# Patient Record
Sex: Female | Born: 1935 | Race: White | Hispanic: No | Smoking: Never smoker
Health system: Southern US, Community
[De-identification: ages and names within clinical notes are randomized; demographics above are authoritative.]

## PROBLEM LIST (undated history)

## (undated) DIAGNOSIS — J439 Emphysema, unspecified: Secondary | ICD-10-CM

## (undated) DIAGNOSIS — I10 Essential (primary) hypertension: Secondary | ICD-10-CM

## (undated) DIAGNOSIS — I5030 Unspecified diastolic (congestive) heart failure: Secondary | ICD-10-CM

## (undated) DIAGNOSIS — T8182XA Emphysema (subcutaneous) resulting from a procedure, initial encounter: Secondary | ICD-10-CM

---

## 2020-03-20 ENCOUNTER — Emergency Department: Payer: Medicare Other

## 2020-03-20 ENCOUNTER — Other Ambulatory Visit: Payer: Self-pay

## 2020-03-20 ENCOUNTER — Encounter: Payer: Self-pay | Admitting: Emergency Medicine

## 2020-03-20 ENCOUNTER — Inpatient Hospital Stay
Admission: EM | Admit: 2020-03-20 | Discharge: 2020-03-23 | DRG: 280 | Disposition: A | Discharge status: Home / Self Care | Payer: Medicare Other | Attending: Internal Medicine | Admitting: Internal Medicine

## 2020-03-20 DIAGNOSIS — Z7901 Long term (current) use of anticoagulants: Secondary | ICD-10-CM

## 2020-03-20 DIAGNOSIS — I4891 Unspecified atrial fibrillation: Secondary | ICD-10-CM

## 2020-03-20 DIAGNOSIS — I214 Non-ST elevation (NSTEMI) myocardial infarction: Principal | ICD-10-CM

## 2020-03-20 DIAGNOSIS — R079 Chest pain, unspecified: Secondary | ICD-10-CM

## 2020-03-20 DIAGNOSIS — I10 Essential (primary) hypertension: Secondary | ICD-10-CM

## 2020-03-20 DIAGNOSIS — I5033 Acute on chronic diastolic (congestive) heart failure: Secondary | ICD-10-CM

## 2020-03-20 DIAGNOSIS — I5043 Acute on chronic combined systolic (congestive) and diastolic (congestive) heart failure: Secondary | ICD-10-CM | POA: Diagnosis present

## 2020-03-20 DIAGNOSIS — I480 Paroxysmal atrial fibrillation: Secondary | ICD-10-CM | POA: Diagnosis present

## 2020-03-20 DIAGNOSIS — Z20822 Contact with and (suspected) exposure to covid-19: Secondary | ICD-10-CM | POA: Diagnosis present

## 2020-03-20 DIAGNOSIS — E785 Hyperlipidemia, unspecified: Secondary | ICD-10-CM | POA: Diagnosis present

## 2020-03-20 DIAGNOSIS — Z91018 Allergy to other foods: Secondary | ICD-10-CM

## 2020-03-20 DIAGNOSIS — I4892 Unspecified atrial flutter: Secondary | ICD-10-CM | POA: Diagnosis present

## 2020-03-20 DIAGNOSIS — Z881 Allergy status to other antibiotic agents status: Secondary | ICD-10-CM

## 2020-03-20 DIAGNOSIS — Z800 Family history of malignant neoplasm of digestive organs: Secondary | ICD-10-CM

## 2020-03-20 DIAGNOSIS — I110 Hypertensive heart disease with heart failure: Secondary | ICD-10-CM | POA: Diagnosis present

## 2020-03-20 DIAGNOSIS — I5181 Takotsubo syndrome: Secondary | ICD-10-CM

## 2020-03-20 DIAGNOSIS — J439 Emphysema, unspecified: Secondary | ICD-10-CM | POA: Diagnosis present

## 2020-03-20 DIAGNOSIS — Z8249 Family history of ischemic heart disease and other diseases of the circulatory system: Secondary | ICD-10-CM

## 2020-03-20 DIAGNOSIS — I5021 Acute systolic (congestive) heart failure: Secondary | ICD-10-CM | POA: Diagnosis present

## 2020-03-20 DIAGNOSIS — Z880 Allergy status to penicillin: Secondary | ICD-10-CM

## 2020-03-20 DIAGNOSIS — D6859 Other primary thrombophilia: Secondary | ICD-10-CM | POA: Diagnosis present

## 2020-03-20 DIAGNOSIS — I2510 Atherosclerotic heart disease of native coronary artery without angina pectoris: Secondary | ICD-10-CM | POA: Diagnosis present

## 2020-03-20 DIAGNOSIS — R778 Other specified abnormalities of plasma proteins: Secondary | ICD-10-CM

## 2020-03-20 DIAGNOSIS — R531 Weakness: Secondary | ICD-10-CM

## 2020-03-20 HISTORY — DX: Emphysema (subcutaneous) resulting from a procedure, initial encounter: T81.82XA

## 2020-03-20 HISTORY — DX: Unspecified diastolic (congestive) heart failure: I50.30

## 2020-03-20 HISTORY — DX: Essential (primary) hypertension: I10

## 2020-03-20 HISTORY — DX: Emphysema, unspecified: J43.9

## 2020-03-20 LAB — CBC
HCT: 37.5 % (ref 36.0–46.0)
Hemoglobin: 12.9 g/dL (ref 12.0–15.0)
MCH: 30.3 pg (ref 26.0–34.0)
MCHC: 34.4 g/dL (ref 30.0–36.0)
MCV: 88 fL (ref 80.0–100.0)
Platelets: 261 10*3/uL (ref 150–400)
RBC: 4.26 MIL/uL (ref 3.87–5.11)
RDW: 14.9 % (ref 11.5–15.5)
WBC: 7.6 10*3/uL (ref 4.0–10.5)
nRBC: 0 % (ref 0.0–0.2)

## 2020-03-20 LAB — BASIC METABOLIC PANEL
Anion gap: 12 (ref 5–15)
BUN: 12 mg/dL (ref 8–23)
CO2: 25 mmol/L (ref 22–32)
Calcium: 9.3 mg/dL (ref 8.9–10.3)
Chloride: 101 mmol/L (ref 98–111)
Creatinine, Ser: 0.89 mg/dL (ref 0.44–1.00)
GFR calc Af Amer: >60 mL/min (ref >60)
GFR calc non Af Amer: 59 mL/min — ABNORMAL LOW (ref >60)
Glucose, Bld: 153 mg/dL — ABNORMAL HIGH (ref 70–99)
Potassium: 3.9 mmol/L (ref 3.5–5.1)
Sodium: 138 mmol/L (ref 135–145)

## 2020-03-20 LAB — TROPONIN I (HIGH SENSITIVITY)
Troponin I (High Sensitivity): 204 ng/L (ref <18)
Troponin I (High Sensitivity): 199 ng/L (ref <18)
Troponin I (High Sensitivity): 230 ng/L (ref <18)

## 2020-03-20 LAB — APTT: aPTT: 32 seconds (ref 24–36)

## 2020-03-20 LAB — PROTIME-INR
INR: 1.1 (ref 0.8–1.2)
Prothrombin Time: 13.4 seconds (ref 11.4–15.2)

## 2020-03-20 LAB — BRAIN NATRIURETIC PEPTIDE: B Natriuretic Peptide: 1019.3 pg/mL — ABNORMAL HIGH (ref 0.0–100.0)

## 2020-03-20 LAB — SARS CORONAVIRUS 2 BY RT PCR (HOSPITAL ORDER, PERFORMED IN ~~LOC~~ HOSPITAL LAB): SARS Coronavirus 2: NEGATIVE

## 2020-03-20 LAB — HEPARIN LEVEL (UNFRACTIONATED): Heparin Unfractionated: 0.69 IU/mL (ref 0.30–0.70)

## 2020-03-20 MED ORDER — DILTIAZEM HCL 25 MG/5ML IV SOLN
10.0000 mg | Freq: Once | INTRAVENOUS | Status: AC
  Administered 2020-03-20: 10 mg via INTRAVENOUS
  Filled 2020-03-20: qty 5

## 2020-03-20 MED ORDER — NITROGLYCERIN 0.4 MG SL SUBL: 0.4000 mg | SUBLINGUAL_TABLET | SUBLINGUAL | Status: DC | PRN

## 2020-03-20 MED ORDER — FUROSEMIDE 10 MG/ML IJ SOLN
20.0000 mg | Freq: Two times a day (BID) | INTRAMUSCULAR | Status: DC
  Administered 2020-03-20 – 2020-03-22 (×4): 20 mg via INTRAVENOUS
  Filled 2020-03-20 (×2): qty 2
  Filled 2020-03-20 (×2): qty 4

## 2020-03-20 MED ORDER — ASPIRIN 300 MG RE SUPP: 300.0000 mg | RECTAL | Status: AC

## 2020-03-20 MED ORDER — HEPARIN SODIUM (PORCINE) 5000 UNIT/ML IJ SOLN
60.0000 [IU]/kg | Freq: Once | INTRAMUSCULAR | Status: AC
  Administered 2020-03-20: 3450 [IU] via INTRAVENOUS

## 2020-03-20 MED ORDER — ASPIRIN 81 MG PO CHEW
324.0000 mg | CHEWABLE_TABLET | ORAL | Status: AC
  Administered 2020-03-20: 324 mg via ORAL
  Filled 2020-03-20: qty 4

## 2020-03-20 MED ORDER — ACETAMINOPHEN 325 MG PO TABS
650.0000 mg | ORAL_TABLET | ORAL | Status: DC | PRN
  Filled 2020-03-20: qty 2

## 2020-03-20 MED ORDER — ONDANSETRON HCL 4 MG/2ML IJ SOLN
4.0000 mg | Freq: Four times a day (QID) | INTRAMUSCULAR | Status: DC | PRN
  Filled 2020-03-20: qty 2

## 2020-03-20 MED ORDER — HEPARIN (PORCINE) 25000 UT/250ML-% IV SOLN
700.0000 [IU]/h | INTRAVENOUS | Status: DC
  Administered 2020-03-20 – 2020-03-22 (×2): 700 [IU]/h via INTRAVENOUS
  Filled 2020-03-20 (×2): qty 250

## 2020-03-20 MED ORDER — ALPRAZOLAM 0.25 MG PO TABS
0.2500 mg | ORAL_TABLET | Freq: Two times a day (BID) | ORAL | Status: DC | PRN
  Administered 2020-03-22: 0.25 mg via ORAL
  Filled 2020-03-20 (×2): qty 1

## 2020-03-20 MED ORDER — DILTIAZEM HCL 25 MG/5ML IV SOLN
15.0000 mg | Freq: Once | INTRAVENOUS | Status: AC
  Administered 2020-03-20: 15 mg via INTRAVENOUS
  Filled 2020-03-20: qty 5

## 2020-03-20 MED ORDER — PRAVASTATIN SODIUM 20 MG PO TABS
20.0000 mg | ORAL_TABLET | Freq: Every day | ORAL | Status: DC
  Administered 2020-03-21: 20 mg via ORAL
  Filled 2020-03-20 (×2): qty 1

## 2020-03-20 MED ORDER — ASPIRIN EC 81 MG PO TBEC
81.0000 mg | DELAYED_RELEASE_TABLET | Freq: Every day | ORAL | Status: DC
  Administered 2020-03-21: 81 mg via ORAL
  Filled 2020-03-20: qty 1

## 2020-03-20 MED ORDER — LISINOPRIL 20 MG PO TABS
40.0000 mg | ORAL_TABLET | Freq: Every day | ORAL | Status: DC
  Administered 2020-03-21 – 2020-03-23 (×3): 40 mg via ORAL
  Filled 2020-03-20 (×2): qty 2
  Filled 2020-03-20: qty 4

## 2020-03-20 NOTE — H&P (Signed)
History and Physical    Anne Mitchell KGU:[id number] DOB: 11-30-1931 DOA: 03/20/2020  PCP: Patient, No Pcp Per   Patient coming from: Home  I have personally briefly reviewed patient's old medical records in [facility]  Chief Complaint: Fatigue and shortness of breath x1 day, episode of chest pain 2 days prior  HPI: Anne Mitchell is a 86 y.o. female with medical history significant for paroxysmal A. fib on Eliquis, diastolic heart failure, hypertension who presents to the emergency room with shortness of breath and dyspnea on exertion, that started and intensified, following an episode of chest pain 2 days prior.  Patient states that while in bed 2 days ago she experienced a burning mid and left-sided chest pain that radiated to the left arm that lasted up to 2 hours and went away with continued rest.  She denied associated nausea, vomiting, diaphoresis, palpitations or lightheadedness.  The pain was of moderate intensity, burning, with no aggravating or alleviating factors.  She did not seek medical attention.  However over the course of the following 2 days she has been very fatigued with shortness of breath with exertion.  She denies orthopnea, lower extremity edema.  She has had no further episodes of chest pain.  Patient follows with cardiologist Dr. Gwen Pounds, and review of records on Care Everywhere reveals that in September 2020 she was having dyspnea on exertion and further diagnostics were discussed at that time.  She was apparently seen by Dr. Gwen Pounds again today and sent in for evaluation. ED Course: Upon arrival in the emergency room she was chest pain-free, but became very dyspneic with transfer from the wheelchair to the stretcher.  Vitals were within normal limits.EKG showed sinus rhythm with T wave inversion V4 V5 5 V6 as well as in V3 and aVF.  Troponin returned at 204.  Other labs unremarkable.  Chest x-ray showed cardiomegaly with central vascular congestion with coarsened  interstitial and bronchitic features suggesting mild developing edema or possible chronic changes.  Hospitalist consulted for admission. Addendum: Soon after admission, patient went into rapid A. fib with rate as high as 160s and was given a 10 mg diltiazem IV bolus  Review of Systems: As per HPI otherwise all other systems on review of systems negative.    Past Medical History:  Diagnosis Date  . Emphysema (subcutaneous) (surgical) resulting from a procedure    Not correct  . Emphysema lung (HCC)   . Hypertension     History reviewed. No pertinent surgical history.   has no history on file for tobacco use, alcohol use, and drug use.  Allergies  Allergen Reactions  . Banana Swelling  . Penicillins Swelling  . Other Swelling and Rash    "mycin" meds  Gin    Family History  Problem Relation Age of Onset  . Colon cancer Mother   . Hypertension Mother       Prior to Admission medications   Medication Sig Start Date End Date Taking? Authorizing Provider  Cholecalciferol 50 MCG (2000 UT) CAPS Take 2,000 Units by mouth daily.    [provider]  diltiazem (CARDIZEM CD) 120 MG 24 hr capsule Take 120 mg by mouth daily. 01/12/20   [provider]  ELIQUIS 2.5 MG TABS tablet Take 2.5 mg by mouth 2 (two) times daily. 01/15/20   [provider]  furosemide (LASIX) 40 MG tablet Take 40 mg by mouth daily. 02/20/20   [provider]  lisinopril (ZESTRIL) 40 MG tablet Take 40 mg  by mouth daily. 02/20/20   [provider]  lovastatin (MEVACOR) 40 MG tablet Take 40 mg by mouth daily. 01/16/20   [provider]  Multiple Vitamins-Minerals (VITRUM SENIOR) TABS Take by mouth.    [provider]  sertraline (ZOLOFT) 50 MG tablet Take 50 mg by mouth daily. 01/12/20   [provider]    Physical Exam: Vitals:   03/20/20 1830 03/20/20 1833 03/20/20 2109  BP: (!) 135/56  (!) 151/78  Pulse: 77  62  Resp: 19  18  Temp: 98 F (36.7  C)    TempSrc: Oral    SpO2: 97%  99%  Weight:  57.2 kg   Height:  5\' 1"  (1.549 m)      Vitals:   03/20/20 1830 03/20/20 1833 03/20/20 2109  BP: (!) 135/56  (!) 151/78  Pulse: 77  62  Resp: 19  18  Temp: 98 F (36.7 C)    TempSrc: Oral    SpO2: 97%  99%  Weight:  57.2 kg   Height:  5\' 1"  (1.549 m)       Constitutional: Alert and oriented x 3 . Not in any apparent distress HEENT:      Head: Normocephalic and atraumatic.         Eyes: PERLA, EOMI, Conjunctivae are normal. Sclera is non-icteric.       Mouth/Throat: Mucous membranes are moist.       Neck: Supple with no signs of meningismus. Cardiovascular:  Rapid, irregular around 118. No murmurs, gallops, or rubs. 2+ symmetrical distal pulses are present . No JVD. No LE edema Respiratory: Respiratory effort normal .Lungs sounds clear bilaterally. No wheezes, crackles, or rhonchi.  Gastrointestinal: Soft, non tender, and non distended with positive bowel sounds. No rebound or guarding. Genitourinary: No CVA tenderness. Musculoskeletal: Nontender with normal range of motion in all extremities. No cyanosis, or erythema of extremities. Neurologic: Normal speech and language. Face is symmetric. Moving all extremities. No gross focal neurologic deficits . Skin: Skin is warm, dry.  No rash or ulcers Psychiatric: Mood and affect are normal Speech and behavior are normal   Labs on Admission: I have personally reviewed following labs and imaging studies  CBC: Recent Labs  Lab 03/20/20 1838  WBC 7.6  HGB 12.9  HCT 37.5  MCV 88.0  PLT 261   Basic Metabolic Panel: Recent Labs  Lab 03/20/20 1838  NA 138  K 3.9  CL 101  CO2 25  GLUCOSE 153*  BUN 12  CREATININE 0.89  CALCIUM 9.3   GFR: Estimated Creatinine Clearance: 34.9 mL/min (by C-G formula based on SCr of 0.89 mg/dL). Liver Function Tests: No results for input(s): AST, ALT, ALKPHOS, BILITOT, PROT, ALBUMIN in the last 168 hours. No results for input(s): LIPASE,  AMYLASE in the last 168 hours. No results for input(s): AMMONIA in the last 168 hours. Coagulation Profile: No results for input(s): INR, PROTIME in the last 168 hours. Cardiac Enzymes: No results for input(s): CKTOTAL, CKMB, CKMBINDEX, TROPONINI in the last 168 hours. BNP (last 3 results) No results for input(s): PROBNP in the last 8760 hours. HbA1C: No results for input(s): HGBA1C in the last 72 hours. CBG: No results for input(s): GLUCAP in the last 168 hours. Lipid Profile: No results for input(s): CHOL, HDL, LDLCALC, TRIG, CHOLHDL, LDLDIRECT in the last 72 hours. Thyroid Function Tests: No results for input(s): TSH, T4TOTAL, FREET4, T3FREE, THYROIDAB in the last 72 hours. Anemia Panel: No results for input(s): VITAMINB12, FOLATE, FERRITIN,  TIBC, IRON, RETICCTPCT in the last 72 hours. Urine analysis: No results found for: COLORURINE, APPEARANCEUR, LABSPEC, PHURINE, GLUCOSEU, HGBUR, BILIRUBINUR, KETONESUR, PROTEINUR, UROBILINOGEN, NITRITE, LEUKOCYTESUR  Radiological Exams on Admission: DG Chest 2 View  Result Date: 03/20/2020 CLINICAL DATA:  Weakness and dizziness early Tuesday morning now with pain to left chest EXAM: CHEST - 2 VIEW COMPARISON:  None. FINDINGS: There are diffusely coarsened interstitial and bronchitic features throughout the lungs with low volumes and atelectasis. Central vascular congestion is present as well with an enlarged heart and a calcified, tortuous aorta. No pneumothorax or effusion. No acute osseous or soft tissue abnormality. Degenerative changes are present in the imaged spine and shoulders. Surgical clips along the right breast/axillary region. IMPRESSION: 1. Cardiomegaly with central vascular congestion. 2. Coarsened interstitial and bronchitic features throughout the lungs, much of which could be chronic however more hazy opacities could also suggest mild developing edema, less likely infection in the absence of appropriate clinical symptoms. Electronically  Signed   By: Kreg Shropshire M.D.   On: 03/20/2020 19:31    EKG: Independently reviewed. Interpretation : EKG showed sinus rhythm with T wave inversion V4 V5 5 V6 as well as in V3 and aVF.  Assessment/Plan 86 year old female with history of paroxysmal A. fib on Eliquis, diastolic heart failure, hypertension presenting with fatigue and shortness of breath  following an episode of chest pain 2 days prior    NSTEMI (non-ST elevated myocardial infarction) (HCC) -Patient with an episode of chest pain 2 days prior with both typical and atypical features, now with T wave changes in lateral leads and troponin 204. -Continue to trend troponins -Aspirin, pravastatin, no beta-blocker for now as patient on diltiazem -Nitroglycerin sublingual as needed chest pain with morphine for breakthrough -Heparin infusion(hold home Eliquis) -Cardiology consult.  Sees Dr. Gwen Pounds -N.p.o. after midnight in case procedure is warranted Addendum: Following admission, while still in the ER, patient went into rapid A. fib.  Appeared to have ST elevation and code STEMI was called by ER provider.  Patient remained chest pain-free.  Troponin had down trended some 204>>199.  Spoke with Dr. Juliann Pares who is on STEMI call who reviewed EKG, discussed not quite meeting STEMI criteria.  At the time of this dictation on his way to evaluate patient    Acute on chronic diastolic CHF (congestive heart failure) (HCC) -Patient with fatigue and shortness of breath, chest x-ray consistent with failure.  BNP pending -Suspect precipitated by possible ACS as above 2 days prior to presentation -IV Lasix.  Continue home lisinopril.  No beta-blocker for now due to patient on diltiazem -Daily weights, intake and output monitoring -Echocardiogram in the a.m.    Essential hypertension -Blood pressure controlled.  Continue home lisinopril and diltiazem  Paroxysmal atrial fibrillation with rapid ventricular response Chronic  anticoagulation -Patient was in sinus rhythm on arrival but went into rapid A. fib with rate in the 160s and was given a one-time dose of diltiazem 10 mg IV -We will continue to monitor and repeat diltiazem bolus and start infusion if needed -Hold home Eliquis as patient will be on heparin infusion   Emphysema lung (HCC) -Currently stable    DVT prophylaxis: Heparin infusion Code Status: full code  Family Communication: Daughter Anne Mitchell at bedside Disposition Plan: Back to previous home environment Consults called: Cardiology Status: Observation    Andris Baumann MD [facility]     03/20/2020, 9:34 PM

## 2020-03-20 NOTE — ED Triage Notes (Signed)
Pt reports she had an episode of weakness and dizziness early Tuesday morning and now has some pain to her left chest.

## 2020-03-20 NOTE — ED Notes (Signed)
MD Duncan at bedside.  

## 2020-03-20 NOTE — ED Notes (Signed)
Pt's HR jumped from 103 to 156. Dr. Juliann Pares at bedside

## 2020-03-20 NOTE — Progress Notes (Signed)
ANTICOAGULATION CONSULT NOTE - Initial Consult  Pharmacy Consult for Heparin  Indication: chest pain/ACS  Allergies  Allergen Reactions  . Banana Swelling  . Penicillins Swelling  . Other Swelling and Rash    "mycin" meds  Gin    Patient Measurements: Height: 5\' 1"  (154.9 cm) Weight: 57.2 kg (126 lb) IBW/kg (Calculated) : 47.8 Heparin Dosing Weight: 57.2 kg   Vital Signs: Temp: 98 F (36.7 C) (07/07 1830) Temp Source: Oral (07/07 1830) BP: 151/78 (07/07 2109) Pulse Rate: 62 (07/07 2109)  Labs: Recent Labs    03/20/20 1838  HGB 12.9  HCT 37.5  PLT 261  CREATININE 0.89  TROPONINIHS 204*    Estimated Creatinine Clearance: 34.9 mL/min (by C-G formula based on SCr of 0.89 mg/dL).   Medical History: Past Medical History:  Diagnosis Date  . Emphysema (subcutaneous) (surgical) resulting from a procedure    Not correct  . Emphysema lung (HCC)   . Hypertension     Medications:  (Not in a hospital admission)   Assessment: Pharmacy consulted to dose heparin in this 86 year old female admitted with ACS/NSTEMI.  CrCl = 34.9 ml/min  Pt was on Eliquis 2.5 mg PO BID , last dose was on 7/7 @ 1000.   Goal of Therapy:  Heparin level 0.3-0.7 units/ml aPTT 66 - 102s seconds Monitor platelets by anticoagulation protocol: Yes   Plan:  Will order heparin drip to start @ 700 units/hr.  Will draw aPTT and HL 8 hrs after start of drip.  Will use aPTT to guide dosing until HL and aPTT are both therapeutic.   Anne Mitchell D 03/20/2020,9:51 PM

## 2020-03-20 NOTE — ED Provider Notes (Signed)
[facility] Emergency Department Provider Note   ____________________________________________   First MD Initiated Contact with Patient 03/20/20 2037     (approximate)  I have reviewed the triage vital signs and the nursing notes.   HISTORY  Chief Complaint Weakness, Chest Pain, and Dizziness    HPI Anne Mitchell is a 86 y.o. female who reports that either Monday or Tuesday morning she was laying in bed and developed burning pain in her chest going into the arm.  This lasted possibly 2 hours or slightly longer.  Since then she has been very weak having no energy.  Today she is short of breath and got short of breath just walking from the wheelchair into the ER bed to lay on the bed.  She has not had any recurrence of the burning.  She has a history of emphysema and CHF.  She also has a history of hypertension.         Past Medical History:  Diagnosis Date  . Emphysema (subcutaneous) (surgical) resulting from a procedure    Not correct  . Emphysema lung (HCC)   . Hypertension       History reviewed. No pertinent surgical history.  Prior to Admission medications   Not on File    Allergies Other and Penicillins  No family history on file.  Social History Social History   Tobacco Use  . Smoking status: Not on file  Substance Use Topics  . Alcohol use: Not on file  . Drug use: Not on file    Review of Systems  Constitutional: No fever/chills Eyes: No visual changes. ENT: No sore throat. Cardiovascular: Denies chest pain. Respiratory:  shortness of breath. Gastrointestinal: No abdominal pain.  No nausea, no vomiting.  No diarrhea.  No constipation. Genitourinary: Negative for dysuria. Musculoskeletal: Negative for back pain. Skin: Negative for rash. Neurological: Negative for headaches, focal weakness   ____________________________________________   PHYSICAL EXAM:  VITAL SIGNS: ED Triage Vitals  Enc Vitals Group     BP  03/20/20 1830 (!) 135/56     Pulse Rate 03/20/20 1830 77     Resp 03/20/20 1830 19     Temp 03/20/20 1830 98 F (36.7 C)     Temp Source 03/20/20 1830 Oral     SpO2 03/20/20 1830 97 %     Weight 03/20/20 1833 126 lb (57.2 kg)     Height 03/20/20 1833 5\' 1"  (1.549 m)     Head Circumference --      Peak Flow --      Pain Score 03/20/20 1833 3     Pain Loc --      Pain Edu? --      Excl. in GC? --     Constitutional: Alert and oriented. Well appearing and in no acute distress. Eyes: Conjunctivae are normal. Head: Atraumatic. Nose: No congestion/rhinnorhea. Mouth/Throat: Mucous membranes are moist.  Oropharynx non-erythematous. Neck: No stridor.  Cardiovascular: Normal rate, regular rhythm. Grossly normal heart sounds.  Good peripheral circulation. Respiratory: Normal respiratory effort.  No retractions. Lungs crackles in bases Gastrointestinal: Soft and nontender. No distention. No abdominal bruits.  Musculoskeletal: No lower extremity tenderness trace bilateral edema.  Neurologic:  Normal speech and language. No gross focal neurologic deficits are appreciated.  Skin:  Skin is warm, dry and intact.   ____________________________________________   LABS (all labs ordered are listed, but only abnormal results are displayed)  Labs Reviewed  BASIC METABOLIC PANEL - Abnormal; Notable for the following  components:      Result Value   Glucose, Bld 153 (*)    GFR calc non Af Amer 59 (*)    All other components within normal limits  TROPONIN I (HIGH SENSITIVITY) - Abnormal; Notable for the following components:   Troponin I (High Sensitivity) 204 (*)    All other components within normal limits  CBC  BRAIN NATRIURETIC PEPTIDE  TROPONIN I (HIGH SENSITIVITY)   ____________________________________________  EKG  EKG read interpreted by me shows sinus rhythm at a rate of 74 extreme right axis deviation left bundle branch block there are some ST elevation in lead II but not  otherwise.  There are flipped T's in leads I and L and the 5 and 6.  There are no old EKGs that I can find. ____________________________________________  RADIOLOGY  ED MD interpretation chest x-ray read by radiology reviewed by me is consistent with developing CHF  Official radiology report(s): DG Chest 2 View  Result Date: 03/20/2020 CLINICAL DATA:  Weakness and dizziness early Tuesday morning now with pain to left chest EXAM: CHEST - 2 VIEW COMPARISON:  None. FINDINGS: There are diffusely coarsened interstitial and bronchitic features throughout the lungs with low volumes and atelectasis. Central vascular congestion is present as well with an enlarged heart and a calcified, tortuous aorta. No pneumothorax or effusion. No acute osseous or soft tissue abnormality. Degenerative changes are present in the imaged spine and shoulders. Surgical clips along the right breast/axillary region. IMPRESSION: 1. Cardiomegaly with central vascular congestion. 2. Coarsened interstitial and bronchitic features throughout the lungs, much of which could be chronic however more hazy opacities could also suggest mild developing edema, less likely infection in the absence of appropriate clinical symptoms. Electronically Signed   By: Kreg Shropshire M.D.   On: 03/20/2020 19:31    ____________________________________________   PROCEDURES  Procedure(s) performed (including Critical Care):  Procedures   ____________________________________________   INITIAL IMPRESSION / ASSESSMENT AND PLAN / ED COURSE  Patient reports she had the burning on night or 2 ago and then while she was having the burning she had a blood pressure in the 180s.  She did not wake up her daughter and did not call 911 the next day her blood pressure went down into the 150s and 140s but she felt wiped out very tired and somewhat short of breath.  Short of breath this has gotten worse.  Here her troponin is elevated at 205204 chest x-ray looks like  CHF.  EKG is abnormal.  Patient is 86 years old with a history of hypertension therefore at risk for MI.  With her troponin being elevated and the history it is worrisome to me that she may have actually had a heart attack Monday or Tuesday night.  She may now be developing some congestive failure.              ____________________________________________   FINAL CLINICAL IMPRESSION(S) / ED DIAGNOSES  Final diagnoses:  Generalized weakness  Elevated troponin     ED Discharge Orders    None       Note:  This document was prepared using Dragon voice recognition software and may include unintentional dictation errors.    Arnaldo Natal, MD 03/20/20 2101

## 2020-03-20 NOTE — ED Notes (Signed)
Called [facility] to [facility] [phone] per Dr. Darnelle Catalan

## 2020-03-20 NOTE — Consult Note (Signed)
Patient code STEMI consult note Code STEMI was called by emergency room physician after EKG changes were identified Patient presented with chest pain symptoms stuttering and eventually developed paroxysmal atrial fibrillation which has been no known Patient is on Eliquis Patient currently is hemodynamically stable and denies any significant chest pain EKGs to my reading do not meet STEMI criteria Initial EKG with sinus rhythm interventricular conduction delay borderline ST segment changes Follow-up EKG rapid atrial fibrillation also with nonspecific EKG ST changes Of the 3 EKGs I reviewed none of them meet STEMI criteria Clinically the patient is stable without significant chest pain no shortness of breath Blood pressure heart rate and sats are all benign First 2 troponins are about 200 about 3 hours apart a third 1 is pending I have canceled the code STEMI I am not convinced the patient is having enough acute coronary syndrome she is not unstable so I will defer invasive  cardiac cath at this point Agree with admitting her and following up EKGs troponins and formal cardiology consult in the morning Thanks for the opportunity to evaluate the patient Code STEMI canceled [facility]

## 2020-03-21 ENCOUNTER — Observation Stay
Admit: 2020-03-21 | Discharge: 2020-03-21 | Disposition: A | Discharge status: Home / Self Care | Payer: Medicare Other | Attending: Internal Medicine | Admitting: Internal Medicine

## 2020-03-21 ENCOUNTER — Encounter: Payer: Self-pay | Admitting: Internal Medicine

## 2020-03-21 ENCOUNTER — Other Ambulatory Visit: Payer: Self-pay

## 2020-03-21 DIAGNOSIS — I480 Paroxysmal atrial fibrillation: Secondary | ICD-10-CM | POA: Diagnosis present

## 2020-03-21 DIAGNOSIS — Z91018 Allergy to other foods: Secondary | ICD-10-CM | POA: Diagnosis not present

## 2020-03-21 DIAGNOSIS — I5021 Acute systolic (congestive) heart failure: Secondary | ICD-10-CM | POA: Diagnosis not present

## 2020-03-21 DIAGNOSIS — Z20822 Contact with and (suspected) exposure to covid-19: Secondary | ICD-10-CM | POA: Diagnosis present

## 2020-03-21 DIAGNOSIS — I4892 Unspecified atrial flutter: Secondary | ICD-10-CM | POA: Diagnosis present

## 2020-03-21 DIAGNOSIS — Z880 Allergy status to penicillin: Secondary | ICD-10-CM | POA: Diagnosis not present

## 2020-03-21 DIAGNOSIS — Z881 Allergy status to other antibiotic agents status: Secondary | ICD-10-CM | POA: Diagnosis not present

## 2020-03-21 DIAGNOSIS — I214 Non-ST elevation (NSTEMI) myocardial infarction: Secondary | ICD-10-CM | POA: Diagnosis present

## 2020-03-21 DIAGNOSIS — Z7901 Long term (current) use of anticoagulants: Secondary | ICD-10-CM

## 2020-03-21 DIAGNOSIS — D6859 Other primary thrombophilia: Secondary | ICD-10-CM | POA: Diagnosis present

## 2020-03-21 DIAGNOSIS — J439 Emphysema, unspecified: Secondary | ICD-10-CM

## 2020-03-21 DIAGNOSIS — Z800 Family history of malignant neoplasm of digestive organs: Secondary | ICD-10-CM | POA: Diagnosis not present

## 2020-03-21 DIAGNOSIS — R079 Chest pain, unspecified: Secondary | ICD-10-CM | POA: Diagnosis present

## 2020-03-21 DIAGNOSIS — Z8249 Family history of ischemic heart disease and other diseases of the circulatory system: Secondary | ICD-10-CM | POA: Diagnosis not present

## 2020-03-21 DIAGNOSIS — I2510 Atherosclerotic heart disease of native coronary artery without angina pectoris: Secondary | ICD-10-CM | POA: Diagnosis present

## 2020-03-21 DIAGNOSIS — E785 Hyperlipidemia, unspecified: Secondary | ICD-10-CM | POA: Diagnosis present

## 2020-03-21 DIAGNOSIS — I5181 Takotsubo syndrome: Secondary | ICD-10-CM | POA: Diagnosis not present

## 2020-03-21 DIAGNOSIS — I110 Hypertensive heart disease with heart failure: Secondary | ICD-10-CM | POA: Diagnosis present

## 2020-03-21 DIAGNOSIS — I5043 Acute on chronic combined systolic (congestive) and diastolic (congestive) heart failure: Secondary | ICD-10-CM | POA: Diagnosis present

## 2020-03-21 LAB — LIPID PANEL
Cholesterol: 157 mg/dL (ref 0–200)
HDL: 75 mg/dL (ref >40)
LDL Cholesterol: 74 mg/dL (ref 0–99)
Total CHOL/HDL Ratio: 2.1 RATIO
Triglycerides: 39 mg/dL (ref <150)
VLDL: 8 mg/dL (ref 0–40)

## 2020-03-21 LAB — CBC
HCT: 37.4 % (ref 36.0–46.0)
Hemoglobin: 12.8 g/dL (ref 12.0–15.0)
MCH: 29.9 pg (ref 26.0–34.0)
MCHC: 34.2 g/dL (ref 30.0–36.0)
MCV: 87.4 fL (ref 80.0–100.0)
Platelets: 257 10*3/uL (ref 150–400)
RBC: 4.28 MIL/uL (ref 3.87–5.11)
RDW: 14.7 % (ref 11.5–15.5)
WBC: 8.4 10*3/uL (ref 4.0–10.5)
nRBC: 0 % (ref 0.0–0.2)

## 2020-03-21 LAB — BASIC METABOLIC PANEL
Anion gap: 10 (ref 5–15)
BUN: 12 mg/dL (ref 8–23)
CO2: 28 mmol/L (ref 22–32)
Calcium: 9 mg/dL (ref 8.9–10.3)
Chloride: 104 mmol/L (ref 98–111)
Creatinine, Ser: 0.82 mg/dL (ref 0.44–1.00)
GFR calc Af Amer: >60 mL/min (ref >60)
GFR calc non Af Amer: >60 mL/min (ref >60)
Glucose, Bld: 105 mg/dL — ABNORMAL HIGH (ref 70–99)
Potassium: 3.3 mmol/L — ABNORMAL LOW (ref 3.5–5.1)
Sodium: 142 mmol/L (ref 135–145)

## 2020-03-21 LAB — MAGNESIUM: Magnesium: 1.9 mg/dL (ref 1.7–2.4)

## 2020-03-21 LAB — APTT
aPTT: 103 seconds — ABNORMAL HIGH (ref 24–36)
aPTT: 54 seconds — ABNORMAL HIGH (ref 24–36)

## 2020-03-21 LAB — ECHOCARDIOGRAM COMPLETE
Height: 61 in
Weight: 2016 oz

## 2020-03-21 LAB — HEPARIN LEVEL (UNFRACTIONATED): Heparin Unfractionated: 1 IU/mL — ABNORMAL HIGH (ref 0.30–0.70)

## 2020-03-21 MED ORDER — POTASSIUM CHLORIDE CRYS ER 20 MEQ PO TBCR
20.0000 meq | EXTENDED_RELEASE_TABLET | Freq: Once | ORAL | Status: AC
  Administered 2020-03-21: 20 meq via ORAL
  Filled 2020-03-21: qty 1

## 2020-03-21 MED ORDER — SODIUM CHLORIDE 0.9% FLUSH
3.0000 mL | Freq: Two times a day (BID) | INTRAVENOUS | Status: DC
  Administered 2020-03-21 – 2020-03-22 (×3): 3 mL via INTRAVENOUS

## 2020-03-21 MED ORDER — POTASSIUM CHLORIDE CRYS ER 20 MEQ PO TBCR
40.0000 meq | EXTENDED_RELEASE_TABLET | Freq: Once | ORAL | Status: AC
  Administered 2020-03-21: 40 meq via ORAL
  Filled 2020-03-21: qty 2

## 2020-03-21 MED ORDER — DILTIAZEM HCL ER COATED BEADS 120 MG PO CP24: 120.0000 mg | ORAL_CAPSULE | Freq: Every day | ORAL | Status: DC

## 2020-03-21 MED ORDER — METOPROLOL TARTRATE 25 MG PO TABS
12.5000 mg | ORAL_TABLET | Freq: Two times a day (BID) | ORAL | Status: DC
  Administered 2020-03-21 – 2020-03-23 (×4): 12.5 mg via ORAL
  Filled 2020-03-21 (×5): qty 1

## 2020-03-21 MED ORDER — POLYETHYLENE GLYCOL 3350 17 G PO PACK
17.0000 g | PACK | Freq: Every day | ORAL | Status: DC
  Administered 2020-03-22: 17 g via ORAL
  Filled 2020-03-21 (×2): qty 1

## 2020-03-21 MED ORDER — METOPROLOL TARTRATE 5 MG/5ML IV SOLN: 5.0000 mg | INTRAVENOUS | Status: DC | PRN

## 2020-03-21 NOTE — ED Notes (Signed)
Daughter at bedside.

## 2020-03-21 NOTE — ED Notes (Signed)
Pt feeling nauseous at this time

## 2020-03-21 NOTE — Progress Notes (Signed)
ANTICOAGULATION CONSULT NOTE - Initial Consult  Pharmacy Consult for Heparin  Indication: chest pain/ACS  Allergies  Allergen Reactions  . Banana Swelling  . Penicillins Swelling  . Other Swelling and Rash    "mycin" meds  Gin    Patient Measurements: Height: 5\' 1"  (154.9 cm) Weight: 57.2 kg (126 lb) IBW/kg (Calculated) : 47.8 Heparin Dosing Weight: 57.2 kg   Vital Signs: BP: 138/65 (07/08 0745) Pulse Rate: 61 (07/08 0745)  Labs: Recent Labs    03/20/20 1838 03/20/20 1838 03/20/20 2104 03/20/20 2143 03/20/20 2306 03/21/20 0109 03/21/20 0652  HGB 12.9  --   --   --   --   --  12.8  HCT 37.5  --   --   --   --   --  37.4  PLT 261  --   --   --   --   --  257  APTT  --   --   --  32  --   --  103*  LABPROT  --   --   --  13.4  --   --   --   INR  --   --   --  1.1  --   --   --   HEPARINUNFRC  --   --   --  0.69  --   --  1.00*  CREATININE 0.89  --   --   --   --   --  0.82  TROPONINIHS 204*   < > 199*  --  230* 251*  --    < > = values in this interval not displayed.    Estimated Creatinine Clearance: 37.8 mL/min (by C-G formula based on SCr of 0.82 mg/dL).   Medical History: Past Medical History:  Diagnosis Date  . Emphysema (subcutaneous) (surgical) resulting from a procedure    Not correct  . Emphysema lung (HCC)   . Hypertension     Medications:  (Not in a hospital admission)   Assessment: Pharmacy consulted to dose heparin in this 86 year old female admitted with ACS/NSTEMI.  CrCl = 34.9 ml/min  Pt was on Eliquis 2.5 mg PO BID , last dose was on 7/7 @ 1000.   Goal of Therapy:  Heparin level 0.3-0.7 units/ml aPTT 66 - 102s seconds Monitor platelets by anticoagulation protocol: Yes   Plan:  07/08 @ 0652 aPTT: 103, HL: 1.0. Level not yet correlating.  Will adjust by aPTT level. APTT just slightly supratherapeutic. Will decrease infusion rate to 600 units/hr Will use aPTT to guide dosing until HL and aPTT are both therapeutic.   Recheck  aPTT level in 6 hours.  Continue to monitor CBCs and HL level daily while on heparin.   09/08, PharmD, BCPS Clinical Pharmacist 03/21/2020 8:19 AM

## 2020-03-21 NOTE — Consult Note (Signed)
[facility] Cardiology Consultation Note  Patient ID: Anne Mitchell, MRN: [id number], DOB/AGE: Feb 09, 1932 86 y.o. Admit date: 03/20/2020   Date of Consult: 03/21/2020 Primary Physician: Patient, No Pcp Per Primary Cardiologist: Gwen Pounds  Chief Complaint:  Chief Complaint  Patient presents with  . Weakness  . Chest Pain  . Dizziness   Reason for Consult: Non-ST elevation myocardial infarction  HPI: 86 y.o. female with known paroxysmal nonvalvular atrial fibrillation and coronary atherosclerosis hypertension hyperlipidemia on appropriate medication management.  The patient has had new onset of severe central and right chest discomfort over the last several days for which this happened 2 days prior.  With this chest discomfort she had nausea weakness fatigue and diaphoresis.  She thought she felt slightly better but continued to have difficulty.  EKG had shown atrial flutter with left interventricular conduction defect left axis deviation right bundle branch block with anterior lateral T wave inversions with nonspecific ST changes.  There was slight changes since her previous EKG.  The patient also had atrial flutter with rapid failure right for which she has responded to appropriate medication management and now spontaneously converted to normal rhythm.  With admission to the emergency room the patient had a chest x-ray showing vascular congestion as well.  Troponin level has peaked at 251 although this is several days after her primary event.  The patient currently feels slightly better but still has a light amount of chest pressure but shortness of breath is better after injection of furosemide.  Heart rate control is reasonable at this time.  The patient was placed on heparin with no evidence of bleeding complications and Eliquis has been discontinued.  Currently the patient does have an echocardiogram showing apical LV systolic dysfunction with ejection fraction of 25 to 30% concerning for the  possibility of stress-induced cardiomyopathy versus anterior apical myocardial infarction.  Currently she has not had any edema or chest discomfort progressive at this time  Past Medical History:  Diagnosis Date  . Emphysema (subcutaneous) (surgical) resulting from a procedure    Not correct  . Emphysema lung (HCC)   . Hypertension       Surgical History: History reviewed. No pertinent surgical history.   Home Meds: Prior to Admission medications   Medication Sig Start Date End Date Taking? Authorizing Provider  Cholecalciferol 50 MCG (2000 UT) CAPS Take 2,000 Units by mouth daily.   Yes [provider]  diltiazem (CARDIZEM CD) 120 MG 24 hr capsule Take 120 mg by mouth daily. 01/12/20  Yes [provider]  ELIQUIS 2.5 MG TABS tablet Take 2.5 mg by mouth 2 (two) times daily. 01/15/20  Yes [provider]  furosemide (LASIX) 40 MG tablet Take 40 mg by mouth daily. 02/20/20  Yes [provider]  lisinopril (ZESTRIL) 40 MG tablet Take 40 mg by mouth daily. 02/20/20  Yes [provider]  lovastatin (MEVACOR) 40 MG tablet Take 40 mg by mouth daily. 01/16/20  Yes [provider]  Multiple Vitamins-Minerals (VITRUM SENIOR) TABS Take by mouth.   Yes [provider]  sertraline (ZOLOFT) 50 MG tablet Take 50 mg by mouth daily. 01/12/20  Yes [provider]    Inpatient Medications:  . aspirin EC  81 mg Oral Daily  . furosemide  20 mg Intravenous Q12H  . lisinopril  40 mg Oral Daily  . metoprolol tartrate  12.5 mg Oral BID  . polyethylene glycol  17 g Oral Daily  . pravastatin  20 mg Oral q1800   .  heparin 600 Units/hr (03/21/20 [id number])    Allergies:  Allergies  Allergen Reactions  . Banana Swelling  . Penicillins Swelling  . Other Swelling and Rash    "mycin" meds  Gin    Social History   Socioeconomic History  . Marital status: Unknown    Spouse name: Not on file  . Number of children: Not on file  . Years of  education: Not on file  . Highest education level: Not on file  Occupational History  . Not on file  Tobacco Use  . Smoking status: Not on file  Substance and Sexual Activity  . Alcohol use: Not on file  . Drug use: Not on file  . Sexual activity: Not on file  Other Topics Concern  . Not on file  Social History Narrative  . Not on file   Social Determinants of Health   Financial Resource Strain:   . Difficulty of Paying Living Expenses:   Food Insecurity:   . Worried About Programme researcher, broadcasting/film/videounning Out of Food in the Last Year:   . Baristaan Out of Food in the Last Year:   Transportation Needs:   . Freight forwarderLack of Transportation (Medical):   Marland Kitchen. Lack of Transportation (Non-Medical):   Physical Activity:   . Days of Exercise per Week:   . Minutes of Exercise per Session:   Stress:   . Feeling of Stress :   Social Connections:   . Frequency of Communication with Friends and Family:   . Frequency of Social Gatherings with Friends and Family:   . Attends Religious Services:   . Active Member of Clubs or Organizations:   . Attends BankerClub or Organization Meetings:   Marland Kitchen. Marital Status:   Intimate Partner Violence:   . Fear of Current or Ex-Partner:   . Emotionally Abused:   Marland Kitchen. Physically Abused:   . Sexually Abused:      Family History  Problem Relation Age of Onset  . Colon cancer Mother   . Hypertension Mother      Review of Systems Positive for chest pain shortness of breath Negative for: General:  chills, fever, night sweats or weight changes.  Cardiovascular: PND orthopnea syncope dizziness  Dermatological skin lesions rashes Respiratory: Cough congestion Urologic: Frequent urination urination at night and hematuria Abdominal: negative for nausea, vomiting, diarrhea, bright red blood per rectum, melena, or hematemesis Neurologic: negative for visual changes, and/or hearing changes  All other systems reviewed and are otherwise negative except as noted above.  Labs: No results for input(s):  CKTOTAL, CKMB, TROPONINI in the last 72 hours. Lab Results  Component Value Date   WBC 8.4 03/21/2020   HGB 12.8 03/21/2020   HCT 37.4 03/21/2020   MCV 87.4 03/21/2020   PLT 257 03/21/2020    Recent Labs  Lab 03/21/20 0652  NA 142  K 3.3*  CL 104  CO2 28  BUN 12  CREATININE 0.82  CALCIUM 9.0  GLUCOSE 105*   Lab Results  Component Value Date   CHOL 157 03/21/2020   HDL 75 03/21/2020   LDLCALC 74 03/21/2020   TRIG 39 03/21/2020   No results found for: DDIMER  Radiology/Studies:  DG Chest 2 View  Result Date: 03/20/2020 CLINICAL DATA:  Weakness and dizziness early Tuesday morning now with pain to left chest EXAM: CHEST - 2 VIEW COMPARISON:  None. FINDINGS: There are diffusely coarsened interstitial and bronchitic features throughout the lungs with low volumes and atelectasis. Central vascular congestion is present as well with an  enlarged heart and a calcified, tortuous aorta. No pneumothorax or effusion. No acute osseous or soft tissue abnormality. Degenerative changes are present in the imaged spine and shoulders. Surgical clips along the right breast/axillary region. IMPRESSION: 1. Cardiomegaly with central vascular congestion. 2. Coarsened interstitial and bronchitic features throughout the lungs, much of which could be chronic however more hazy opacities could also suggest mild developing edema, less likely infection in the absence of appropriate clinical symptoms. Electronically Signed   By: Kreg Shropshire M.D.   On: 03/20/2020 19:31   ECHOCARDIOGRAM COMPLETE  Result Date: 03/21/2020    ECHOCARDIOGRAM REPORT   Patient Name:   Anne Mitchell Date of Exam: 03/21/2020 Medical Rec #:  [id number]      Height:       61.0 in Accession #:    [id number]     Weight:       126.0 lb Date of Birth:  [birth year]      BSA:          1.552 m Patient Age:    87 years       BP:           121/73 mmHg Patient Gender: F              HR:           62 bpm. Exam Location:  [facility] Procedure: 2D Echo, Color  Doppler and Cardiac Doppler Indications:     I50.31 CHF-Acute Diastolic  History:         Patient has no prior history of Echocardiogram examinations.                  Lung emphysema; Risk Factors:Hypertension.  Sonographer:     Humphrey Rolls RDCS (AE) Referring Phys:  [id number] Andris Baumann Diagnosing Phys: Arnoldo Hooker MD IMPRESSIONS  1. Left ventricular ejection fraction, by estimation, is 25 to 30%. The left ventricle has severely decreased function. The left ventricle demonstrates regional wall motion abnormalities (see scoring diagram/findings for description). The left ventricular internal cavity size was mildly to moderately dilated. Left ventricular diastolic parameters were normal.  2. Right ventricular systolic function is normal. The right ventricular size is normal. There is moderately elevated pulmonary artery systolic pressure.  3. Left atrial size was mildly dilated.  4. The mitral valve is normal in structure. Mild to moderate mitral valve regurgitation.  5. The aortic valve is normal in structure. Aortic valve regurgitation is not visualized. FINDINGS  Left Ventricle: Left ventricular ejection fraction, by estimation, is 25 to 30%. The left ventricle has severely decreased function. The left ventricle demonstrates regional wall motion abnormalities. Severe hypokinesis of the left ventricular, entire myocardium. The left ventricular internal cavity size was mildly to moderately dilated. There is no left ventricular hypertrophy. Left ventricular diastolic parameters were normal. Right Ventricle: The right ventricular size is normal. No increase in right ventricular wall thickness. Right ventricular systolic function is normal. There is moderately elevated pulmonary artery systolic pressure. The tricuspid regurgitant velocity is 3.21 m/s, and with an assumed right atrial pressure of 10 mmHg, the estimated right ventricular systolic pressure is 51.2 mmHg. Left Atrium: Left atrial size was mildly  dilated. Right Atrium: Right atrial size was normal in size. Pericardium: There is no evidence of pericardial effusion. Mitral Valve: The mitral valve is normal in structure. Mild to moderate mitral valve regurgitation. MV peak gradient, 2.9 mmHg. The mean mitral valve gradient is 1.0 mmHg. Tricuspid Valve: The tricuspid valve is normal in  structure. Tricuspid valve regurgitation is trivial. Aortic Valve: The aortic valve is normal in structure. Aortic valve regurgitation is not visualized. Aortic valve mean gradient measures 2.0 mmHg. Aortic valve peak gradient measures 4.2 mmHg. Aortic valve area, by VTI measures 2.28 cm. Pulmonic Valve: The pulmonic valve was normal in structure. Pulmonic valve regurgitation is not visualized. Aorta: The aortic root and ascending aorta are structurally normal, with no evidence of dilitation. IAS/Shunts: No atrial level shunt detected by color flow Doppler.  LEFT VENTRICLE PLAX 2D LVIDd:         4.31 cm     Diastology LVIDs:         3.33 cm     LV e' lateral:   4.35 cm/s LV PW:         0.97 cm     LV E/e' lateral: 11.1 LV IVS:        0.85 cm     LV e' medial:    3.05 cm/s LVOT diam:     1.90 cm     LV E/e' medial:  15.9 LV SV:         43 LV SV Index:   28 LVOT Area:     2.84 cm  LV Volumes (MOD) LV vol d, MOD A2C: 83.8 ml LV vol d, MOD A4C: 69.5 ml LV vol s, MOD A2C: 48.5 ml LV vol s, MOD A4C: 37.8 ml LV SV MOD A2C:     35.3 ml LV SV MOD A4C:     69.5 ml LV SV MOD BP:      34.5 ml RIGHT VENTRICLE RV Basal diam:  3.11 cm LEFT ATRIUM             Index       RIGHT ATRIUM           Index LA diam:        4.70 cm 3.03 cm/m  RA Area:     14.30 cm LA Vol (A2C):   43.7 ml 28.16 ml/m RA Volume:   34.20 ml  22.04 ml/m LA Vol (A4C):   41.8 ml 26.93 ml/m LA Biplane Vol: 46.2 ml 29.77 ml/m  AORTIC VALVE                   PULMONIC VALVE AV Area (Vmax):    1.93 cm    PV Vmax:       0.99 m/s AV Area (Vmean):   1.99 cm    PV Vmean:      57.300 cm/s AV Area (VTI):     2.28 cm    PV VTI:         0.174 m AV Vmax:           103.00 cm/s PV Peak grad:  3.9 mmHg AV Vmean:          68.800 cm/s PV Mean grad:  2.0 mmHg AV VTI:            0.190 m AV Peak Grad:      4.2 mmHg AV Mean Grad:      2.0 mmHg LVOT Vmax:         70.20 cm/s LVOT Vmean:        48.200 cm/s LVOT VTI:          0.153 m LVOT/AV VTI ratio: 0.81  AORTA Ao Root diam: 2.90 cm MITRAL VALVE               TRICUSPID VALVE MV Area (  PHT): 3.68 cm    TR Peak grad:   41.2 mmHg MV Peak grad:  2.9 mmHg    TR Vmax:        321.00 cm/s MV Mean grad:  1.0 mmHg MV Vmax:       0.85 m/s    SHUNTS MV Vmean:      49.6 cm/s   Systemic VTI:  0.15 m MV Decel Time: 206 msec    Systemic Diam: 1.90 cm MV E velocity: 48.50 cm/s MV A velocity: 75.20 cm/s MV E/A ratio:  0.64 Arnoldo Hooker MD Electronically signed by Arnoldo Hooker MD Signature Date/Time: 03/21/2020/4:50:29 PM    Final     EKG: Normal sinus rhythm with left atrial enlargement left axis deviation anterior lateral T wave inversions  Weights: Filed Weights   03/20/20 1833  Weight: 57.2 kg     Physical Exam: Blood pressure 137/65, pulse 64, temperature 97.9 F (36.6 C), resp. rate 19, height  (1.549 m), weight 57.2 kg, SpO2 97 %. Body mass index is 23.81 kg/m. General: Well developed, well nourished, in no acute distress. Head eyes ears nose throat: Normocephalic, atraumatic, sclera non-icteric, no xanthomas, nares are without discharge. No apparent thyromegaly and/or mass  Lungs: Normal respiratory effort.  no wheezes, no rales, no rhonchi.  Heart: RRR with normal S1 S2. no murmur gallop, no rub, PMI is normal size and placement, carotid upstroke normal without bruit, jugular venous pressure is normal Abdomen: Soft, non-tender, non-distended with normoactive bowel sounds. No hepatomegaly. No rebound/guarding. No obvious abdominal masses. Abdominal aorta is normal size without bruit Extremities: No edema. no cyanosis, no clubbing, no ulcers  Peripheral : 2+ bilateral upper extremity  pulses, 2+ bilateral femoral pulses, 2+ bilateral dorsal pedal pulse Neuro: Alert and oriented. No facial asymmetry. No focal deficit. Moves all extremities spontaneously. Musculoskeletal: Normal muscle tone without kyphosis Psych:  Responds to questions appropriately with a normal affect.    Assessment: 86 year old female with paroxysmal nonvalvular atrial fibrillation, history of coronary artery disease, hypertension, hyperlipidemia on appropriate medications with an acute event earlier this week and now with LV systolic dysfunction with ejection fraction of 25 to 30% and possible non-ST elevation myocardial infarction with congestive heart failure  Plan: 1.  Continue metoprolol lisinopril medication management for maintenance of normal sinus rhythm and acute systolic dysfunction congestive heart failure 2.  Continue furosemide intravenously at 20 mg as listed for acute systolic dysfunction congestive heart failure and pulmonary edema 3.  Discontinuation of Eliquis for possible procedures and further diagnostics to reduce bleeding complications 4.  Heparin for further risk reduction and possible myocardial infarction 5.  Proceed to cardiac catheterization to assess coronary anatomy and further treatment thereof as necessary.  Patient understands the risk and benefits of cardiac catheterization.  This includes the possibility of death stroke heart attack infection bleeding or blood clot.  Patient is at low risk for conscious sedation  Signed, Lamar Blinks M.D. [facility] Cardiology 03/21/2020, 5:02 PM

## 2020-03-21 NOTE — Progress Notes (Signed)
ANTICOAGULATION CONSULT NOTE - Initial Consult  Pharmacy Consult for Heparin  Indication: chest pain/ACS  Allergies  Allergen Reactions  . Banana Swelling  . Penicillins Swelling  . Other Swelling and Rash    "mycin" meds  Gin    Patient Measurements: Height: 5\' 1"  (154.9 cm) Weight: 57.2 kg (126 lb) IBW/kg (Calculated) : 47.8 Heparin Dosing Weight: 57.2 kg   Vital Signs: Temp: 97.9 F (36.6 C) (07/08 1617) BP: 137/65 (07/08 1617) Pulse Rate: 64 (07/08 1617)  Labs: Recent Labs    03/20/20 1838 03/20/20 1838 03/20/20 2104 03/20/20 2143 03/20/20 2306 03/21/20 0109 03/21/20 0652 03/21/20 1625  HGB 12.9  --   --   --   --   --  12.8  --   HCT 37.5  --   --   --   --   --  37.4  --   PLT 261  --   --   --   --   --  257  --   APTT  --   --   --  32  --   --  103* 54*  LABPROT  --   --   --  13.4  --   --   --   --   INR  --   --   --  1.1  --   --   --   --   HEPARINUNFRC  --   --   --  0.69  --   --  1.00*  --   CREATININE 0.89  --   --   --   --   --  0.82  --   TROPONINIHS 204*   < > 199*  --  230* 251*  --   --    < > = values in this interval not displayed.    Estimated Creatinine Clearance: 37.8 mL/min (by C-G formula based on SCr of 0.82 mg/dL).   Medical History: Past Medical History:  Diagnosis Date  . Emphysema (subcutaneous) (surgical) resulting from a procedure    Not correct  . Emphysema lung (HCC)   . Hypertension     Medications:  Medications Prior to Admission  Medication Sig Dispense Refill Last Dose  . Cholecalciferol 50 MCG (2000 UT) CAPS Take 2,000 Units by mouth daily.   03/20/2020 at 1000  . diltiazem (CARDIZEM CD) 120 MG 24 hr capsule Take 120 mg by mouth daily.   03/20/2020 at 1000  . ELIQUIS 2.5 MG TABS tablet Take 2.5 mg by mouth 2 (two) times daily.   03/20/2020 at 1000  . furosemide (LASIX) 40 MG tablet Take 40 mg by mouth daily.   03/20/2020 at 1000  . lisinopril (ZESTRIL) 40 MG tablet Take 40 mg by mouth daily.   03/20/2020 at 1000   . lovastatin (MEVACOR) 40 MG tablet Take 40 mg by mouth daily.   03/19/2020 at 2000  . Multiple Vitamins-Minerals (VITRUM SENIOR) TABS Take by mouth.   03/20/2020 at 1000  . sertraline (ZOLOFT) 50 MG tablet Take 50 mg by mouth daily.   03/20/2020 at 1000    Assessment: Pharmacy consulted to dose heparin in this 86 year old female admitted with ACS/NSTEMI.  CrCl = 34.9 ml/min  Pt was on Eliquis 2.5 mg PO BID , last dose was on 7/7 @ 1000.   7/8 0652 aPTT 103 HL 1 decrease infusion rate to 600 units/hr 7/8 1625 aPTT 54  Goal of Therapy:  Heparin level 0.3-0.7 units/ml aPTT  66 - 102s seconds Monitor platelets by anticoagulation protocol: Yes   Plan:  APTT is subtherapeutic. Will increase the heparin infusion to 700 units/hr.  Recheck aPTT level in 6 hours. CBC and heparin level with AM labs.    Ronnald Ramp, PharmD, BCPS Clinical Pharmacist 03/21/2020 5:09 PM

## 2020-03-21 NOTE — Progress Notes (Signed)
PROGRESS NOTE    Anne Mitchell  GEX:[id number] DOB: 31-Oct-1931 DOA: 03/20/2020 PCP: Patient, No Pcp Per    Brief Narrative:  HPI: Anne Mitchell is a 86 y.o. female with medical history significant for paroxysmal A. fib on Eliquis, diastolic heart failure, hypertension who presents to the emergency room with shortness of breath and dyspnea on exertion, that started and intensified, following an episode of chest pain 2 days prior.  Patient states that while in bed 2 days ago she experienced a burning mid and left-sided chest pain that radiated to the left arm that lasted up to 2 hours and went away with continued rest.  She denied associated nausea, vomiting, diaphoresis, palpitations or lightheadedness.  The pain was of moderate intensity, burning, with no aggravating or alleviating factors.  She did not seek medical attention.  However over the course of the following 2 days she has been very fatigued with shortness of breath with exertion.  She denies orthopnea, lower extremity edema.  She has had no further episodes of chest pain.  Patient follows with cardiologist Dr. Gwen Pounds, and review of records on Care Everywhere reveals that in September 2020 she was having dyspnea on exertion and further diagnostics were discussed at that time.  She was apparently seen by Dr. Gwen Pounds again today and sent in for evaluation. ED Course: Upon arrival in the emergency room she was chest pain-free, but became very dyspneic with transfer from the wheelchair to the stretcher.  Vitals were within normal limits.EKG showed sinus rhythm with T wave inversion V4 V5 5 V6 as well as in V3 and aVF.  Troponin returned at 204.  Other labs unremarkable.  Chest x-ray showed cardiomegaly with central vascular congestion with coarsened interstitial and bronchitic features suggesting mild developing edema or possible chronic changes.  Hospitalist consulted for admission. Addendum: Soon after admission, patient went into rapid A. fib  with rate as high as 160s and was given a 10 mg diltiazem IV bolus   Assessment & Plan:   Principal Problem:   NSTEMI (non-ST elevated myocardial infarction) (HCC) Active Problems:   Essential hypertension   AF (paroxysmal atrial fibrillation) (HCC)   Acute on chronic diastolic CHF (congestive heart failure) (HCC)   Chronic anticoagulation   Emphysema lung (HCC)   Rapid atrial fibrillation (HCC)   Acute systolic CHF (congestive heart failure) (HCC)   1. NSTEMI. No complaints of chest pain at present. Troponins have been flat in 200 range. Continue on ASA, statin, and beta blockers. She has acute decline in EF on echo. Cardiology following and plans on cardiac cath in AM to evaluate for ischemic etiology of cardiomyopathy 2. Acute systolic CHF. EF 25-30% on echo. She is on intravenous lasix and still has some evidence of volume overload. Continue current treatments. 3. Paroxysmal defibrillation.  She had an episode of rapid atrial flutter overnight.  She received intravenous Cardizem.  She is since converted back to sinus rhythm.  Continued on low-dose beta-blockers to suppress heart rate.  Currently, she is anticoagulated with heparin.  She is chronically anticoagulated with Eliquis 4. Hypertension.  Blood pressure stable.  Continue on lisinopril, Toprol. 5. COPD with emphysema.  No wheezing or shortness of breath.  Currently stable.   DVT prophylaxis: heparin infusion  Code Status: full code Family Communication: discussed with daughter at the bedside Disposition Plan: Status is: Inpatient  Remains inpatient appropriate because:Inpatient level of care appropriate due to severity of illness. Patient needs further work up including cardiac catheterization   Dispo:  The patient is from: Home              Anticipated d/c is to: TBD              Anticipated d/c date is: 2 days              Patient currently is not medically stable to d/c.   Consultants:   cardiology  Procedures:    Echo: 1. Left ventricular ejection fraction, by estimation, is 25 to 30%. The  left ventricle has severely decreased function. The left ventricle  demonstrates regional wall motion abnormalities (see scoring  diagram/findings for description). The left  ventricular internal cavity size was mildly to moderately dilated. Left  ventricular diastolic parameters were normal.  2. Right ventricular systolic function is normal. The right ventricular  size is normal. There is moderately elevated pulmonary artery systolic  pressure.  3. Left atrial size was mildly dilated.  4. The mitral valve is normal in structure. Mild to moderate mitral valve  regurgitation.  5. The aortic valve is normal in structure. Aortic valve regurgitation is  not visualized.  Antimicrobials:       Subjective: Had an episode of A fib overnight, which has since converted back to sinus rhythm. No chest pain at present  Objective: Vitals:   03/21/20 1100 03/21/20 1221 03/21/20 1223 03/21/20 1617  BP: 121/73 124/68 124/68 137/65  Pulse: (!) 58 63 62 64  Resp: 14 (!) 21 (!) 21 19  Temp:    97.9 F (36.6 C)  TempSrc:      SpO2: 99% 98% 99% 97%  Weight:      Height:        Intake/Output Summary (Last 24 hours) at 03/21/2020 1907 Last data filed at 03/21/2020 1600 Gross per 24 hour  Intake 143.25 ml  Output 1900 ml  Net -1756.75 ml   Filed Weights   03/20/20 1833  Weight: 57.2 kg    Examination:  General exam: Appears calm and comfortable  Respiratory system: Clear to auscultation. Respiratory effort normal. Cardiovascular system: S1 & S2 heard, RRR. No JVD, murmurs, rubs, gallops or clicks. No pedal edema. Gastrointestinal system: Abdomen is nondistended, soft and nontender. No organomegaly or masses felt. Normal bowel sounds heard. Central nervous system: Alert and oriented. No focal neurological deficits. Extremities: Symmetric 5 x 5 power. Skin: No rashes, lesions or ulcers Psychiatry:  Judgement and insight appear normal. Mood & affect appropriate.     Data Reviewed: I have personally reviewed following labs and imaging studies  CBC: Recent Labs  Lab 03/20/20 1838 03/21/20 0652  WBC 7.6 8.4  HGB 12.9 12.8  HCT 37.5 37.4  MCV 88.0 87.4  PLT 261 257   Basic Metabolic Panel: Recent Labs  Lab 03/20/20 1838 03/21/20 0652  NA 138 142  K 3.9 3.3*  CL 101 104  CO2 25 28  GLUCOSE 153* 105*  BUN 12 12  CREATININE 0.89 0.82  CALCIUM 9.3 9.0  MG  --  1.9   GFR: Estimated Creatinine Clearance: 37.8 mL/min (by C-G formula based on SCr of 0.82 mg/dL). Liver Function Tests: No results for input(s): AST, ALT, ALKPHOS, BILITOT, PROT, ALBUMIN in the last 168 hours. No results for input(s): LIPASE, AMYLASE in the last 168 hours. No results for input(s): AMMONIA in the last 168 hours. Coagulation Profile: Recent Labs  Lab 03/20/20 2143  INR 1.1   Cardiac Enzymes: No results for input(s): CKTOTAL, CKMB, CKMBINDEX, TROPONINI in the last 168 hours.  BNP (last 3 results) No results for input(s): PROBNP in the last 8760 hours. HbA1C: No results for input(s): HGBA1C in the last 72 hours. CBG: No results for input(s): GLUCAP in the last 168 hours. Lipid Profile: Recent Labs    03/21/20 0652  CHOL 157  HDL 75  LDLCALC 74  TRIG 39  CHOLHDL 2.1   Thyroid Function Tests: No results for input(s): TSH, T4TOTAL, FREET4, T3FREE, THYROIDAB in the last 72 hours. Anemia Panel: No results for input(s): VITAMINB12, FOLATE, FERRITIN, TIBC, IRON, RETICCTPCT in the last 72 hours. Sepsis Labs: No results for input(s): PROCALCITON, LATICACIDVEN in the last 168 hours.  Recent Results (from the past 240 hour(s))  SARS Coronavirus 2 by RT PCR (hospital order, performed in [facility] hospital lab) Nasopharyngeal Nasopharyngeal Swab     Status: None   Collection Time: 03/20/20  9:43 PM   Specimen: Nasopharyngeal Swab  Result Value Ref Range Status   SARS Coronavirus 2  NEGATIVE NEGATIVE Final    Comment: (NOTE) SARS-CoV-2 target nucleic acids are NOT DETECTED.  The SARS-CoV-2 RNA is generally detectable in upper and lower respiratory specimens during the acute phase of infection. The lowest concentration of SARS-CoV-2 viral copies this assay can detect is 250 copies / mL. A negative result does not preclude SARS-CoV-2 infection and should not be used as the sole basis for treatment or other patient management decisions.  A negative result may occur with improper specimen collection / handling, submission of specimen other than nasopharyngeal swab, presence of viral mutation(s) within the areas targeted by this assay, and inadequate number of viral copies (<250 copies / mL). A negative result must be combined with clinical observations, patient history, and epidemiological information.  Fact Sheet for Patients:   BoilerBrush.com.cy  Fact Sheet for Healthcare Providers: https://pope.com/  This test is not yet approved or  cleared by the [place] FDA and has been authorized for detection and/or diagnosis of SARS-CoV-2 by FDA under an Emergency Use Authorization (EUA).  This EUA will remain in effect (meaning this test can be used) for the duration of the COVID-19 declaration under Section 564(b)(1) of the Act, 21 U.S.C. section 360bbb-3(b)(1), unless the authorization is terminated or revoked sooner.  Performed at [facility], [address] [place], [place] [address]          Radiology Studies: DG Chest 2 View  Result Date: 03/20/2020 CLINICAL DATA:  Weakness and dizziness early Tuesday morning now with pain to left chest EXAM: CHEST - 2 VIEW COMPARISON:  None. FINDINGS: There are diffusely coarsened interstitial and bronchitic features throughout the lungs with low volumes and atelectasis. Central vascular congestion is present as well with an enlarged heart and a calcified,  tortuous aorta. No pneumothorax or effusion. No acute osseous or soft tissue abnormality. Degenerative changes are present in the imaged spine and shoulders. Surgical clips along the right breast/axillary region. IMPRESSION: 1. Cardiomegaly with central vascular congestion. 2. Coarsened interstitial and bronchitic features throughout the lungs, much of which could be chronic however more hazy opacities could also suggest mild developing edema, less likely infection in the absence of appropriate clinical symptoms. Electronically Signed   By: Kreg Shropshire M.D.   On: 03/20/2020 19:31   ECHOCARDIOGRAM COMPLETE  Result Date: 03/21/2020    ECHOCARDIOGRAM REPORT   Patient Name:   Anne Mitchell Date of Exam: 03/21/2020 Medical Rec #:  [id number]      Height:       61.0 in Accession #:  [id number]     Weight:       126.0 lb Date of Birth:  September 17, 1931      BSA:          1.552 m Patient Age:    87 years       BP:           121/73 mmHg Patient Gender: F              HR:           62 bpm. Exam Location:  [facility] Procedure: 2D Echo, Color Doppler and Cardiac Doppler Indications:     I50.31 CHF-Acute Diastolic  History:         Patient has no prior history of Echocardiogram examinations.                  Lung emphysema; Risk Factors:Hypertension.  Sonographer:     Humphrey Rolls RDCS (AE) Referring Phys:  [id number] Andris Baumann Diagnosing Phys: Arnoldo Hooker MD IMPRESSIONS  1. Left ventricular ejection fraction, by estimation, is 25 to 30%. The left ventricle has severely decreased function. The left ventricle demonstrates regional wall motion abnormalities (see scoring diagram/findings for description). The left ventricular internal cavity size was mildly to moderately dilated. Left ventricular diastolic parameters were normal.  2. Right ventricular systolic function is normal. The right ventricular size is normal. There is moderately elevated pulmonary artery systolic pressure.  3. Left atrial size was mildly dilated.  4. The  mitral valve is normal in structure. Mild to moderate mitral valve regurgitation.  5. The aortic valve is normal in structure. Aortic valve regurgitation is not visualized. FINDINGS  Left Ventricle: Left ventricular ejection fraction, by estimation, is 25 to 30%. The left ventricle has severely decreased function. The left ventricle demonstrates regional wall motion abnormalities. Severe hypokinesis of the left ventricular, entire myocardium. The left ventricular internal cavity size was mildly to moderately dilated. There is no left ventricular hypertrophy. Left ventricular diastolic parameters were normal. Right Ventricle: The right ventricular size is normal. No increase in right ventricular wall thickness. Right ventricular systolic function is normal. There is moderately elevated pulmonary artery systolic pressure. The tricuspid regurgitant velocity is 3.21 m/s, and with an assumed right atrial pressure of 10 mmHg, the estimated right ventricular systolic pressure is 51.2 mmHg. Left Atrium: Left atrial size was mildly dilated. Right Atrium: Right atrial size was normal in size. Pericardium: There is no evidence of pericardial effusion. Mitral Valve: The mitral valve is normal in structure. Mild to moderate mitral valve regurgitation. MV peak gradient, 2.9 mmHg. The mean mitral valve gradient is 1.0 mmHg. Tricuspid Valve: The tricuspid valve is normal in structure. Tricuspid valve regurgitation is trivial. Aortic Valve: The aortic valve is normal in structure. Aortic valve regurgitation is not visualized. Aortic valve mean gradient measures 2.0 mmHg. Aortic valve peak gradient measures 4.2 mmHg. Aortic valve area, by VTI measures 2.28 cm. Pulmonic Valve: The pulmonic valve was normal in structure. Pulmonic valve regurgitation is not visualized. Aorta: The aortic root and ascending aorta are structurally normal, with no evidence of dilitation. IAS/Shunts: No atrial level shunt detected by color flow Doppler.   LEFT VENTRICLE PLAX 2D LVIDd:         4.31 cm     Diastology LVIDs:         3.33 cm     LV e' lateral:   4.35 cm/s LV PW:         0.97 cm  LV E/e' lateral: 11.1 LV IVS:        0.85 cm     LV e' medial:    3.05 cm/s LVOT diam:     1.90 cm     LV E/e' medial:  15.9 LV SV:         43 LV SV Index:   28 LVOT Area:     2.84 cm  LV Volumes (MOD) LV vol d, MOD A2C: 83.8 ml LV vol d, MOD A4C: 69.5 ml LV vol s, MOD A2C: 48.5 ml LV vol s, MOD A4C: 37.8 ml LV SV MOD A2C:     35.3 ml LV SV MOD A4C:     69.5 ml LV SV MOD BP:      34.5 ml RIGHT VENTRICLE RV Basal diam:  3.11 cm LEFT ATRIUM             Index       RIGHT ATRIUM           Index LA diam:        4.70 cm 3.03 cm/m  RA Area:     14.30 cm LA Vol (A2C):   43.7 ml 28.16 ml/m RA Volume:   34.20 ml  22.04 ml/m LA Vol (A4C):   41.8 ml 26.93 ml/m LA Biplane Vol: 46.2 ml 29.77 ml/m  AORTIC VALVE                   PULMONIC VALVE AV Area (Vmax):    1.93 cm    PV Vmax:       0.99 m/s AV Area (Vmean):   1.99 cm    PV Vmean:      57.300 cm/s AV Area (VTI):     2.28 cm    PV VTI:        0.174 m AV Vmax:           103.00 cm/s PV Peak grad:  3.9 mmHg AV Vmean:          68.800 cm/s PV Mean grad:  2.0 mmHg AV VTI:            0.190 m AV Peak Grad:      4.2 mmHg AV Mean Grad:      2.0 mmHg LVOT Vmax:         70.20 cm/s LVOT Vmean:        48.200 cm/s LVOT VTI:          0.153 m LVOT/AV VTI ratio: 0.81  AORTA Ao Root diam: 2.90 cm MITRAL VALVE               TRICUSPID VALVE MV Area (PHT): 3.68 cm    TR Peak grad:   41.2 mmHg MV Peak grad:  2.9 mmHg    TR Vmax:        321.00 cm/s MV Mean grad:  1.0 mmHg MV Vmax:       0.85 m/s    SHUNTS MV Vmean:      49.6 cm/s   Systemic VTI:  0.15 m MV Decel Time: 206 msec    Systemic Diam: 1.90 cm MV E velocity: 48.50 cm/s MV A velocity: 75.20 cm/s MV E/A ratio:  0.64 Arnoldo Hooker MD Electronically signed by Arnoldo Hooker MD Signature Date/Time: 03/21/2020/4:50:29 PM    Final         Scheduled Meds: . aspirin EC  81 mg Oral Daily  .  furosemide  20 mg Intravenous Q12H  . lisinopril  40 mg Oral Daily  . metoprolol tartrate  12.5 mg Oral BID  . polyethylene glycol  17 g Oral Daily  . pravastatin  20 mg Oral q1800  . sodium chloride flush  3 mL Intravenous Q12H   Continuous Infusions: . heparin 700 Units/hr (03/21/20 1745)     LOS: 0 days    Time spent:    Erick Blinks, MD [facility]   If 7PM-7AM, please contact night-coverage [email or web address]  03/21/2020, 7:07 PM

## 2020-03-21 NOTE — Progress Notes (Signed)
Ch attempted visit as per recommendation by Ch.Copeland. Pt asleep at this time.

## 2020-03-21 NOTE — Progress Notes (Signed)
*  PRELIMINARY RESULTS* Echocardiogram 2D Echocardiogram has been performed.  Joanette Gula Iolani Twilley 03/21/2020, 11:36 AM

## 2020-03-21 NOTE — ED Notes (Signed)
Pt ambulatory to toilet to urinate. Bed linen changed. New purewick placed at this time. Pt ate breakfast tray on own.

## 2020-03-21 NOTE — Progress Notes (Signed)
Patient transferred to rm 251, VSS, no complaints of chest pain. Call bell within reach, bed alarm on.

## 2020-03-21 NOTE — ED Notes (Signed)
Pt provided with phone to speak with MD memom.

## 2020-03-21 NOTE — Progress Notes (Addendum)
°   03/20/20 1014  Clinical Encounter Type  Visited With Patient and family together  Visit Type Initial  Referral From Nurse  Consult/Referral To Chaplain  Chaplain responded to ac CODE STEMI page. When she arrived she met 86 yr old patient and daughter. Not long after chaplain arrived, patient told her she that wanted her daughter to go home and rest. Her daughter told her that she would eventually go when she knew what was going on. Patient seemed to be nervous and her daughter continually told her to calm down. After a short period of time, patient calmed down. Patient's daughter left the room and chaplain asked patient how many children she had and she told chaplain, just one and she was a miracle. With a smile and great joy patient explained how she was unable to have children, but to her surprise one day the doctor told her she was pregnant. Patient and daughter appear to have a great relationship. When Dr . Clayborn Bigness came in he told her what he thought was going on, which seem to be good news.Patient connected well with the doctor when she found out he spent time in [place]. Although patient was not feeling 100 percent, she was still feisty. Patient, daughter, and chaplain talked about there dogs and they also talked about their grandchildren. Chaplain left at 12:25am and will her an AM chaplain follow up.

## 2020-03-21 NOTE — ED Notes (Signed)
Pt in toilet having a BM.

## 2020-03-21 NOTE — TOC Initial Note (Signed)
Transition of Care [facility]) - Initial/Assessment Note    Patient Details  Name: Anne Mitchell MRN: [id number] Date of Birth: 1931/10/16  Transition of [facility]) CM/SW Contact:    Westboro Cellar, RN Phone Number: 03/21/2020, 9:54 AM  Clinical Narrative:                 Spoke with patient at bedside. Patient states she lives with her daughter. Patient is independent with her ADL's and has a walker which she rarely uses. Daughter provides transportation as needed. Patient states she is not anticipating any needs at discharge.   Expected Discharge Plan: Home/Self Care Barriers to Discharge: Continued Medical Work up   Patient Goals and CMS Choice Patient states their goals for this hospitalization and ongoing recovery are:: Feel better and get home with daughter      Expected Discharge Plan and Services Expected Discharge Plan: Home/Self Care       Living arrangements for the past 2 months: Single Family Home                                      Prior Living Arrangements/Services Living arrangements for the past 2 months: Single Family Home Lives with:: Adult Children (Daughter) Patient language and need for interpreter reviewed:: Yes Do you feel safe going back to the place where you live?: Yes      Need for Family Participation in Patient Care: Yes (Comment) Care giver support system in place?: Yes (comment) Current home services: DME (walker) Criminal Activity/Legal Involvement Pertinent to Current Situation/Hospitalization: No - Comment as needed  Activities of Daily Living      Permission Sought/Granted                  Emotional Assessment Appearance:: Appears stated age Attitude/Demeanor/Rapport: Ambitious, Engaged Affect (typically observed): Accepting Orientation: : Oriented to Place, Oriented to  Time, Oriented to Situation Alcohol / Substance Use: Never Used Psych Involvement: No (comment)  Admission diagnosis:  NSTEMI (non-ST elevated  myocardial infarction) [facility]) [I21.4] Patient Active Problem List   Diagnosis Date Noted  . Essential hypertension 03/20/2020  . NSTEMI (non-ST elevated myocardial infarction) (HCC) 03/20/2020  . AF (paroxysmal atrial fibrillation) (HCC) 03/20/2020  . Acute on chronic diastolic CHF (congestive heart failure) (HCC) 03/20/2020  . Chronic anticoagulation 03/20/2020  . Emphysema lung (HCC) 03/20/2020  . Rapid atrial fibrillation (HCC) 03/20/2020   PCP:  Patient, No Pcp Per Pharmacy:   [id number] [place], [place] - [address] AT [facility] [address] [place] [address] Phone: [phone] Fax: [phone]     Social Determinants of Health (SDOH) Interventions    Readmission Risk Interventions No flowsheet data found.

## 2020-03-22 ENCOUNTER — Encounter: Payer: Self-pay | Admitting: Internal Medicine

## 2020-03-22 ENCOUNTER — Encounter
Admission: EM | Disposition: A | Discharge status: Home / Self Care | Payer: Self-pay | Source: Home / Self Care | Attending: Internal Medicine

## 2020-03-22 HISTORY — PX: LEFT HEART CATH AND CORONARY ANGIOGRAPHY: CATH118249

## 2020-03-22 LAB — BASIC METABOLIC PANEL
Anion gap: 8 (ref 5–15)
BUN: 15 mg/dL (ref 8–23)
CO2: 29 mmol/L (ref 22–32)
Calcium: 9.1 mg/dL (ref 8.9–10.3)
Chloride: 103 mmol/L (ref 98–111)
Creatinine, Ser: 0.93 mg/dL (ref 0.44–1.00)
GFR calc Af Amer: >60 mL/min (ref >60)
GFR calc non Af Amer: 56 mL/min — ABNORMAL LOW (ref >60)
Glucose, Bld: 108 mg/dL — ABNORMAL HIGH (ref 70–99)
Potassium: 4.6 mmol/L (ref 3.5–5.1)
Sodium: 140 mmol/L (ref 135–145)

## 2020-03-22 LAB — HEPARIN LEVEL (UNFRACTIONATED)
Heparin Unfractionated: 0.52 IU/mL (ref 0.30–0.70)
Heparin Unfractionated: 0.68 IU/mL (ref 0.30–0.70)

## 2020-03-22 LAB — CBC
HCT: 36.3 % (ref 36.0–46.0)
Hemoglobin: 12.3 g/dL (ref 12.0–15.0)
MCH: 30 pg (ref 26.0–34.0)
MCHC: 33.9 g/dL (ref 30.0–36.0)
MCV: 88.5 fL (ref 80.0–100.0)
Platelets: 257 10*3/uL (ref 150–400)
RBC: 4.1 MIL/uL (ref 3.87–5.11)
RDW: 14.9 % (ref 11.5–15.5)
WBC: 8.1 10*3/uL (ref 4.0–10.5)
nRBC: 0 % (ref 0.0–0.2)

## 2020-03-22 LAB — APTT: aPTT: 73 seconds — ABNORMAL HIGH (ref 24–36)

## 2020-03-22 LAB — TROPONIN I (HIGH SENSITIVITY): Troponin I (High Sensitivity): 251 ng/L (ref <18)

## 2020-03-22 SURGERY — LEFT HEART CATH AND CORONARY ANGIOGRAPHY
Anesthesia: Moderate Sedation

## 2020-03-22 MED ORDER — ASPIRIN EC 81 MG PO TBEC
81.0000 mg | DELAYED_RELEASE_TABLET | Freq: Every day | ORAL | Status: DC
  Administered 2020-03-23: 81 mg via ORAL
  Filled 2020-03-22: qty 1

## 2020-03-22 MED ORDER — SODIUM CHLORIDE 0.9 % WEIGHT BASED INFUSION: 1.0000 mL/kg/h | INTRAVENOUS | Status: DC

## 2020-03-22 MED ORDER — SODIUM CHLORIDE 0.9 % WEIGHT BASED INFUSION
INTRAVENOUS | Status: AC
  Administered 2020-03-22: 1000 mL via INTRAVENOUS

## 2020-03-22 MED ORDER — SODIUM CHLORIDE 0.9 % WEIGHT BASED INFUSION: 1.0000 mL/kg/h | INTRAVENOUS | Status: AC

## 2020-03-22 MED ORDER — IOHEXOL 300 MG/ML  SOLN
INTRAMUSCULAR | Status: DC | PRN
  Administered 2020-03-22: 95 mL

## 2020-03-22 MED ORDER — SODIUM CHLORIDE 0.9 % WEIGHT BASED INFUSION: 3.0000 mL/kg/h | INTRAVENOUS | Status: DC

## 2020-03-22 MED ORDER — FUROSEMIDE 40 MG PO TABS
40.0000 mg | ORAL_TABLET | Freq: Every day | ORAL | Status: DC
  Administered 2020-03-22 – 2020-03-23 (×2): 40 mg via ORAL
  Filled 2020-03-22 (×2): qty 1

## 2020-03-22 MED ORDER — MIDAZOLAM HCL 2 MG/2ML IJ SOLN
INTRAMUSCULAR | Status: AC
  Filled 2020-03-22: qty 2

## 2020-03-22 MED ORDER — FENTANYL CITRATE (PF) 100 MCG/2ML IJ SOLN
INTRAMUSCULAR | Status: AC
  Filled 2020-03-22: qty 2

## 2020-03-22 MED ORDER — MIDAZOLAM HCL 2 MG/2ML IJ SOLN
INTRAMUSCULAR | Status: DC | PRN
  Administered 2020-03-22: 0.5 mg via INTRAVENOUS

## 2020-03-22 MED ORDER — SODIUM CHLORIDE 0.9% FLUSH: 3.0000 mL | INTRAVENOUS | Status: DC | PRN

## 2020-03-22 MED ORDER — HYDRALAZINE HCL 20 MG/ML IJ SOLN: 10.0000 mg | INTRAMUSCULAR | Status: AC | PRN

## 2020-03-22 MED ORDER — ASPIRIN 81 MG PO CHEW: 81.0000 mg | CHEWABLE_TABLET | ORAL | Status: DC

## 2020-03-22 MED ORDER — APIXABAN 2.5 MG PO TABS
2.5000 mg | ORAL_TABLET | Freq: Two times a day (BID) | ORAL | Status: DC
  Administered 2020-03-22 – 2020-03-23 (×3): 2.5 mg via ORAL
  Filled 2020-03-22 (×3): qty 1

## 2020-03-22 MED ORDER — HEPARIN (PORCINE) IN NACL 1000-0.9 UT/500ML-% IV SOLN
INTRAVENOUS | Status: DC | PRN
  Administered 2020-03-22: 500 mL

## 2020-03-22 MED ORDER — ASPIRIN 81 MG PO CHEW
81.0000 mg | CHEWABLE_TABLET | ORAL | Status: AC
  Administered 2020-03-22: 81 mg via ORAL
  Filled 2020-03-22: qty 1

## 2020-03-22 MED ORDER — ACETAMINOPHEN 325 MG PO TABS: 650.0000 mg | ORAL_TABLET | ORAL | Status: DC | PRN

## 2020-03-22 MED ORDER — HEPARIN (PORCINE) IN NACL 1000-0.9 UT/500ML-% IV SOLN
INTRAVENOUS | Status: AC
  Filled 2020-03-22: qty 1000

## 2020-03-22 MED ORDER — SODIUM CHLORIDE 0.9 % WEIGHT BASED INFUSION
INTRAVENOUS | Status: DC
  Administered 2020-03-22: 1000 mL via INTRAVENOUS

## 2020-03-22 MED ORDER — SODIUM CHLORIDE 0.9 % IV SOLN: 250.0000 mL | INTRAVENOUS | Status: DC | PRN

## 2020-03-22 MED ORDER — ONDANSETRON HCL 4 MG/2ML IJ SOLN: 4.0000 mg | Freq: Four times a day (QID) | INTRAMUSCULAR | Status: DC | PRN

## 2020-03-22 MED ORDER — ATORVASTATIN CALCIUM 80 MG PO TABS
80.0000 mg | ORAL_TABLET | Freq: Every day | ORAL | Status: DC
  Administered 2020-03-22 – 2020-03-23 (×2): 80 mg via ORAL
  Filled 2020-03-22 (×2): qty 1

## 2020-03-22 MED ORDER — LABETALOL HCL 5 MG/ML IV SOLN: 10.0000 mg | INTRAVENOUS | Status: AC | PRN

## 2020-03-22 MED ORDER — SPIRONOLACTONE 25 MG PO TABS
25.0000 mg | ORAL_TABLET | Freq: Every day | ORAL | Status: DC
  Administered 2020-03-22: 25 mg via ORAL
  Filled 2020-03-22 (×3): qty 1

## 2020-03-22 SURGICAL SUPPLY — 14 items
CATH INFINITI 5FR ANG PIGTAIL (CATHETERS) ×3 IMPLANT
CATH INFINITI 5FR JL4 (CATHETERS) ×3 IMPLANT
CATH INFINITI JR4 5F (CATHETERS) ×3 IMPLANT
DEVICE CLOSURE MYNXGRIP 5F (Vascular Products) ×3 IMPLANT
GLIDESHEATH SLEND SS 6F .021 (SHEATH) IMPLANT
GUIDEWIRE INQWIRE 1.5J.035X260 (WIRE) IMPLANT
INQWIRE 1.5J .035X260CM (WIRE)
KIT MANI 3VAL PERCEP (MISCELLANEOUS) ×3 IMPLANT
NEEDLE PERC 18GX7CM (NEEDLE) ×3 IMPLANT
PACK CARDIAC CATH (CUSTOM PROCEDURE TRAY) ×3 IMPLANT
PROTECTION STATION PRESSURIZED (MISCELLANEOUS) ×3
SHEATH AVANTI 5FR X 11CM (SHEATH) ×3 IMPLANT
STATION PROTECTION PRESSURIZED (MISCELLANEOUS) ×1 IMPLANT
WIRE GUIDERIGHT .035X150 (WIRE) ×3 IMPLANT

## 2020-03-22 NOTE — Progress Notes (Signed)
PROGRESS NOTE    Anne Mitchell  DDU:[id number] DOB: Sep 14, 1932 DOA: 03/20/2020 PCP: Patient, No Pcp Per    Brief Narrative:  HPI: Anne Mitchell is a 86 y.o. female with medical history significant for paroxysmal A. fib on Eliquis, diastolic heart failure, hypertension who presents to the emergency room with shortness of breath and dyspnea on exertion, that started and intensified, following an episode of chest pain 2 days prior.  Patient states that while in bed 2 days ago she experienced a burning mid and left-sided chest pain that radiated to the left arm that lasted up to 2 hours and went away with continued rest.  She denied associated nausea, vomiting, diaphoresis, palpitations or lightheadedness.  The pain was of moderate intensity, burning, with no aggravating or alleviating factors.  She did not seek medical attention.  However over the course of the following 2 days she has been very fatigued with shortness of breath with exertion.  She denies orthopnea, lower extremity edema.  She has had no further episodes of chest pain.  Patient follows with cardiologist Dr. Gwen Pounds, and review of records on Care Everywhere reveals that in September 2020 she was having dyspnea on exertion and further diagnostics were discussed at that time.  She was apparently seen by Dr. Gwen Pounds again today and sent in for evaluation. ED Course: Upon arrival in the emergency room she was chest pain-free, but became very dyspneic with transfer from the wheelchair to the stretcher.  Vitals were within normal limits.EKG showed sinus rhythm with T wave inversion V4 V5 5 V6 as well as in V3 and aVF.  Troponin returned at 204.  Other labs unremarkable.  Chest x-ray showed cardiomegaly with central vascular congestion with coarsened interstitial and bronchitic features suggesting mild developing edema or possible chronic changes.  Hospitalist consulted for admission. Addendum: Soon after admission, patient went into rapid A. fib  with rate as high as 160s and was given a 10 mg diltiazem IV bolus   Assessment & Plan:   Principal Problem:   NSTEMI (non-ST elevated myocardial infarction) (HCC) Active Problems:   Essential hypertension   AF (paroxysmal atrial fibrillation) (HCC)   Acute on chronic diastolic CHF (congestive heart failure) (HCC)   Chronic anticoagulation   Emphysema lung (HCC)   Rapid atrial fibrillation (HCC)   Acute systolic CHF (congestive heart failure) (HCC)   1. NSTEMI. No complaints of chest pain at present. Troponins have been flat in 200 range. Continue on ASA, statin, and beta blockers. She has acute decline in EF on echo. Seen by cardiology and underwent cardiac catheterization where findings indicated that elevated troponins likely related to CHF as opposed to ACS. Recommendations for medical management  2. Acute systolic CHF. EF 25-30% on echo. She is on intravenous lasix and volume status improving. Will transition to oral lasix. She has been started on spironolactone. She is on ACE and BB. Continue current treatments. 3. Paroxysmal defibrillation.  She had an episode of rapid atrial flutter on admission and was initially treated with intravenous Cardizem.  She is since converted back to sinus rhythm.  Continued on low-dose beta-blockers to suppress heart rate.  She is anticoagulated on eliquis 4. Hypertension.  Blood pressure stable.  Continue on lisinopril, Toprol. 5. COPD with emphysema.  No wheezing or shortness of breath.  Currently stable. 6. Dispo. Will request physical therapy evaluation. Anticipate discharge home in next 24 hours if remains stable.   DVT prophylaxis: apixaban (ELIQUIS) tablet 2.5 mg Start: 03/22/20 1400 apixaban (ELIQUIS)  tablet 2.5 mg  Code Status: full code Family Communication: discussed with daughter at the bedside Disposition Plan: Status is: Inpatient  Remains inpatient appropriate because:Inpatient level of care appropriate due to severity of illness.  Patient needs further work up including cardiac catheterization   Dispo: The patient is from: Home              Anticipated d/c is to: TBD              Anticipated d/c date is: 1 day              Patient currently is not medically stable to d/c.   Consultants:   cardiology  Procedures:   Echo: 1. Left ventricular ejection fraction, by estimation, is 25 to 30%. The  left ventricle has severely decreased function. The left ventricle  demonstrates regional wall motion abnormalities (see scoring  diagram/findings for description). The left  ventricular internal cavity size was mildly to moderately dilated. Left  ventricular diastolic parameters were normal.  2. Right ventricular systolic function is normal. The right ventricular  size is normal. There is moderately elevated pulmonary artery systolic  pressure.  3. Left atrial size was mildly dilated.  4. The mitral valve is normal in structure. Mild to moderate mitral valve  regurgitation.  5. The aortic valve is normal in structure. Aortic valve regurgitation is  not visualized.  Cardiac cath: Apical ballooning and/or stress-induced cardiomyopathy with minimal elevation of troponin more consistent with heart failure rather than acute coronary syndrome  Antimicrobials:       Subjective: Patient had cardiac catheterization this morning. She is feeling better today. Breathing improving.  Objective: Vitals:   03/22/20 0915 03/22/20 0930 03/22/20 0935 03/22/20 1129  BP: 121/61 (!) 114/59 (!) 112/58 (!) 100/43  Pulse: (!) 55 63 (!) 53 (!) 52  Resp: Temp:    98.1 F (36.7 C)  TempSrc:    Oral  SpO2: 94% 92% 93% 95%  Weight:      Height:        Intake/Output Summary (Last 24 hours) at 03/22/2020 1348 Last data filed at 03/22/2020 0622 Gross per 24 hour  Intake 596.04 ml  Output 800 ml  Net -203.96 ml   Filed Weights   03/20/20 1833 03/22/20 0605 03/22/20 0800  Weight: 57.2 kg 51.5 kg 51.5 kg     Examination:  General exam: Appears calm and comfortable  Respiratory system: Clear to auscultation. Respiratory effort normal. Cardiovascular system: S1 & S2 heard, RRR. No JVD, murmurs, rubs, gallops or clicks. No pedal edema. Gastrointestinal system: Abdomen is nondistended, soft and nontender. No organomegaly or masses felt. Normal bowel sounds heard. Central nervous system: Alert and oriented. No focal neurological deficits. Extremities: Symmetric 5 x 5 power. Skin: No rashes, lesions or ulcers Psychiatry: Judgement and insight appear normal. Mood & affect appropriate.     Data Reviewed: I have personally reviewed following labs and imaging studies  CBC: Recent Labs  Lab 03/20/20 1838 03/21/20 0652 03/22/20 0200  WBC 7.6 8.4 8.1  HGB 12.9 12.8 12.3  HCT 37.5 37.4 36.3  MCV 88.0 87.4 88.5  PLT 261 257 257   Basic Metabolic Panel: Recent Labs  Lab 03/20/20 1838 03/21/20 0652 03/22/20 0200  NA 138 142 140  K 3.9 3.3* 4.6  CL 101 104 103  CO2 GLUCOSE 153* 105* 108*  BUN CREATININE 0.89 0.82 0.93  CALCIUM 9.3 9.0 9.1  MG  --  1.9  --    GFR: Estimated Creatinine Clearance: 33.4 mL/min (by C-G formula based on SCr of 0.93 mg/dL). Liver Function Tests: No results for input(s): AST, ALT, ALKPHOS, BILITOT, PROT, ALBUMIN in the last 168 hours. No results for input(s): LIPASE, AMYLASE in the last 168 hours. No results for input(s): AMMONIA in the last 168 hours. Coagulation Profile: Recent Labs  Lab 03/20/20 2143  INR 1.1   Cardiac Enzymes: No results for input(s): CKTOTAL, CKMB, CKMBINDEX, TROPONINI in the last 168 hours. BNP (last 3 results) No results for input(s): PROBNP in the last 8760 hours. HbA1C: No results for input(s): HGBA1C in the last 72 hours. CBG: No results for input(s): GLUCAP in the last 168 hours. Lipid Profile: Recent Labs    03/21/20 0652  CHOL 157  HDL 75  LDLCALC 74  TRIG 39  CHOLHDL 2.1   Thyroid  Function Tests: No results for input(s): TSH, T4TOTAL, FREET4, T3FREE, THYROIDAB in the last 72 hours. Anemia Panel: No results for input(s): VITAMINB12, FOLATE, FERRITIN, TIBC, IRON, RETICCTPCT in the last 72 hours. Sepsis Labs: No results for input(s): PROCALCITON, LATICACIDVEN in the last 168 hours.  Recent Results (from the past 240 hour(s))  SARS Coronavirus 2 by RT PCR (hospital order, performed in [facility] hospital lab) Nasopharyngeal Nasopharyngeal Swab     Status: None   Collection Time: 03/20/20  9:43 PM   Specimen: Nasopharyngeal Swab  Result Value Ref Range Status   SARS Coronavirus 2 NEGATIVE NEGATIVE Final    Comment: (NOTE) SARS-CoV-2 target nucleic acids are NOT DETECTED.  The SARS-CoV-2 RNA is generally detectable in upper and lower respiratory specimens during the acute phase of infection. The lowest concentration of SARS-CoV-2 viral copies this assay can detect is 250 copies / mL. A negative result does not preclude SARS-CoV-2 infection and should not be used as the sole basis for treatment or other patient management decisions.  A negative result may occur with improper specimen collection / handling, submission of specimen other than nasopharyngeal swab, presence of viral mutation(s) within the areas targeted by this assay, and inadequate number of viral copies (<250 copies / mL). A negative result must be combined with clinical observations, patient history, and epidemiological information.  Fact Sheet for Patients:   BoilerBrush.com.cy  Fact Sheet for Healthcare Providers: https://pope.com/  This test is not yet approved or  cleared by the [place] FDA and has been authorized for detection and/or diagnosis of SARS-CoV-2 by FDA under an Emergency Use Authorization (EUA).  This EUA will remain in effect (meaning this test can be used) for the duration of the COVID-19 declaration under Section 564(b)(1)  of the Act, 21 U.S.C. section 360bbb-3(b)(1), unless the authorization is terminated or revoked sooner.  Performed at [facility], [address], [place], [place] [address]          Radiology Studies: DG Chest 2 View  Result Date: 03/20/2020 CLINICAL DATA:  Weakness and dizziness early Tuesday morning now with pain to left chest EXAM: CHEST - 2 VIEW COMPARISON:  None. FINDINGS: There are diffusely coarsened interstitial and bronchitic features throughout the lungs with low volumes and atelectasis. Central vascular congestion is present as well with an enlarged heart and a calcified, tortuous aorta. No pneumothorax or effusion. No acute osseous or soft tissue abnormality. Degenerative changes are present in the imaged spine and shoulders. Surgical clips along the right breast/axillary region. IMPRESSION: 1. Cardiomegaly with central vascular congestion. 2. Coarsened interstitial and  bronchitic features throughout the lungs, much of which could be chronic however more hazy opacities could also suggest mild developing edema, less likely infection in the absence of appropriate clinical symptoms. Electronically Signed   By: Kreg Shropshire M.D.   On: 03/20/2020 19:31   CARDIAC CATHETERIZATION  Result Date: 03/22/2020  Prox RCA lesion is 60% stenosed.  Mid RCA lesion is 30% stenosed.  Ost Cx to Prox Cx lesion is 75% stenosed.  Prox Cx lesion is 65% stenosed.  Prox LAD lesion is 30% stenosed.  Mid LAD lesion is 20% stenosed.  86 year old female with known coronary atherosclerosis hypertension hyperlipidemia paroxysmal nonvalvular atrial fibrillation having acute systolic dysfunction congestive heart failure elevated troponin with apical ballooning by echocardiogram consistent with stress-induced cardiomyopathy rather than acute coronary syndrome Apical ballooning with ejection fraction of 25 to 30% 75% stenosis of ostial left circumflex artery 60% stenosis of right coronary artery Mild  atherosclerosis of left anterior descending artery Assessment Apical ballooning and/or stress-induced cardiomyopathy with minimal elevation of troponin more consistent with heart failure rather than acute coronary syndrome Plan Medical management including Lasix beta-blocker spironolactone and ACE inhibitor for cardiomyopathy and congestive heart failure No further cardiac intervention at this time Risk factor modification with high intensity cholesterol therapy Antiplatelet therapy Rehabilitation   ECHOCARDIOGRAM COMPLETE  Result Date: 03/21/2020    ECHOCARDIOGRAM REPORT   Patient Name:   Anne Mitchell Date of Exam: 03/21/2020 Medical Rec #:  [id number]      Height:       61.0 in Accession #:    [id number]     Weight:       126.0 lb Date of Birth:  December 12, 1931      BSA:          1.552 m Patient Age:    87 years       BP:           121/73 mmHg Patient Gender: F              HR:           62 bpm. Exam Location:  [facility] Procedure: 2D Echo, Color Doppler and Cardiac Doppler Indications:     I50.31 CHF-Acute Diastolic  History:         Patient has no prior history of Echocardiogram examinations.                  Lung emphysema; Risk Factors:Hypertension.  Sonographer:     Humphrey Rolls RDCS (AE) Referring Phys:  [id number] Andris Baumann Diagnosing Phys: Arnoldo Hooker MD IMPRESSIONS  1. Left ventricular ejection fraction, by estimation, is 25 to 30%. The left ventricle has severely decreased function. The left ventricle demonstrates regional wall motion abnormalities (see scoring diagram/findings for description). The left ventricular internal cavity size was mildly to moderately dilated. Left ventricular diastolic parameters were normal.  2. Right ventricular systolic function is normal. The right ventricular size is normal. There is moderately elevated pulmonary artery systolic pressure.  3. Left atrial size was mildly dilated.  4. The mitral valve is normal in structure. Mild to moderate mitral valve regurgitation.  5. The  aortic valve is normal in structure. Aortic valve regurgitation is not visualized. FINDINGS  Left Ventricle: Left ventricular ejection fraction, by estimation, is 25 to 30%. The left ventricle has severely decreased function. The left ventricle demonstrates regional wall motion abnormalities. Severe hypokinesis of the left ventricular, entire myocardium. The left ventricular internal cavity size was mildly to moderately dilated. There  is no left ventricular hypertrophy. Left ventricular diastolic parameters were normal. Right Ventricle: The right ventricular size is normal. No increase in right ventricular wall thickness. Right ventricular systolic function is normal. There is moderately elevated pulmonary artery systolic pressure. The tricuspid regurgitant velocity is 3.21 m/s, and with an assumed right atrial pressure of 10 mmHg, the estimated right ventricular systolic pressure is 51.2 mmHg. Left Atrium: Left atrial size was mildly dilated. Right Atrium: Right atrial size was normal in size. Pericardium: There is no evidence of pericardial effusion. Mitral Valve: The mitral valve is normal in structure. Mild to moderate mitral valve regurgitation. MV peak gradient, 2.9 mmHg. The mean mitral valve gradient is 1.0 mmHg. Tricuspid Valve: The tricuspid valve is normal in structure. Tricuspid valve regurgitation is trivial. Aortic Valve: The aortic valve is normal in structure. Aortic valve regurgitation is not visualized. Aortic valve mean gradient measures 2.0 mmHg. Aortic valve peak gradient measures 4.2 mmHg. Aortic valve area, by VTI measures 2.28 cm. Pulmonic Valve: The pulmonic valve was normal in structure. Pulmonic valve regurgitation is not visualized. Aorta: The aortic root and ascending aorta are structurally normal, with no evidence of dilitation. IAS/Shunts: No atrial level shunt detected by color flow Doppler.  LEFT VENTRICLE PLAX 2D LVIDd:         4.31 cm     Diastology LVIDs:         3.33 cm     LV  e' lateral:   4.35 cm/s LV PW:         0.97 cm     LV E/e' lateral: 11.1 LV IVS:        0.85 cm     LV e' medial:    3.05 cm/s LVOT diam:     1.90 cm     LV E/e' medial:  15.9 LV SV:         43 LV SV Index:   28 LVOT Area:     2.84 cm  LV Volumes (MOD) LV vol d, MOD A2C: 83.8 ml LV vol d, MOD A4C: 69.5 ml LV vol s, MOD A2C: 48.5 ml LV vol s, MOD A4C: 37.8 ml LV SV MOD A2C:     35.3 ml LV SV MOD A4C:     69.5 ml LV SV MOD BP:      34.5 ml RIGHT VENTRICLE RV Basal diam:  3.11 cm LEFT ATRIUM             Index       RIGHT ATRIUM           Index LA diam:        4.70 cm 3.03 cm/m  RA Area:     14.30 cm LA Vol (A2C):   43.7 ml 28.16 ml/m RA Volume:   34.20 ml  22.04 ml/m LA Vol (A4C):   41.8 ml 26.93 ml/m LA Biplane Vol: 46.2 ml 29.77 ml/m  AORTIC VALVE                   PULMONIC VALVE AV Area (Vmax):    1.93 cm    PV Vmax:       0.99 m/s AV Area (Vmean):   1.99 cm    PV Vmean:      57.300 cm/s AV Area (VTI):     2.28 cm    PV VTI:        0.174 m AV Vmax:           103.00 cm/s  PV Peak grad:  3.9 mmHg AV Vmean:          68.800 cm/s PV Mean grad:  2.0 mmHg AV VTI:            0.190 m AV Peak Grad:      4.2 mmHg AV Mean Grad:      2.0 mmHg LVOT Vmax:         70.20 cm/s LVOT Vmean:        48.200 cm/s LVOT VTI:          0.153 m LVOT/AV VTI ratio: 0.81  AORTA Ao Root diam: 2.90 cm MITRAL VALVE               TRICUSPID VALVE MV Area (PHT): 3.68 cm    TR Peak grad:   41.2 mmHg MV Peak grad:  2.9 mmHg    TR Vmax:        321.00 cm/s MV Mean grad:  1.0 mmHg MV Vmax:       0.85 m/s    SHUNTS MV Vmean:      49.6 cm/s   Systemic VTI:  0.15 m MV Decel Time: 206 msec    Systemic Diam: 1.90 cm MV E velocity: 48.50 cm/s MV A velocity: 75.20 cm/s MV E/A ratio:  0.64 Arnoldo HookerBruce Kowalski MD Electronically signed by Arnoldo HookerBruce Kowalski MD Signature Date/Time: 03/21/2020/4:50:29 PM    Final         Scheduled Meds: . apixaban  2.5 mg Oral BID  . [START ON 03/23/2020] aspirin EC  81 mg Oral Daily  . atorvastatin  80 mg Oral Daily  .  furosemide  40 mg Oral Daily  . lisinopril  40 mg Oral Daily  . metoprolol tartrate  12.5 mg Oral BID  . polyethylene glycol  17 g Oral Daily  . sodium chloride flush  3 mL Intravenous Q12H  . spironolactone  25 mg Oral Daily   Continuous Infusions: . sodium chloride 1 mL/kg/hr (03/22/20 0925)     LOS: 1 day    Time spent: 35mins    Erick BlinksJehanzeb Amy Belloso, MD [facility]   If 7PM-7AM, please contact night-coverage [email or web address]  03/22/2020, 1:48 PM

## 2020-03-22 NOTE — Progress Notes (Signed)
Pt leaving unit for cath lab. Heparin gtt in place.

## 2020-03-22 NOTE — Progress Notes (Signed)
ANTICOAGULATION CONSULT NOTE - Initial Consult  Pharmacy Consult for Heparin  Indication: chest pain/ACS  Allergies  Allergen Reactions  . Banana Swelling  . Penicillins Swelling  . Other Swelling and Rash    "mycin" meds  Gin    Patient Measurements: Height: 5\' 1"  (154.9 cm) Weight: 57.2 kg (126 lb) IBW/kg (Calculated) : 47.8 Heparin Dosing Weight: 57.2 kg   Vital Signs: Temp: 97.9 F (36.6 C) (07/08 2031) Temp Source: Oral (07/08 2031) BP: 106/66 (07/08 2031) Pulse Rate: 62 (07/08 2031)  Labs: Recent Labs    03/20/20 1838 03/20/20 1838 03/20/20 2104 03/20/20 2143 03/20/20 2306 03/21/20 0109 03/21/20 0652 03/21/20 1625 03/22/20 0200  HGB 12.9   < >  --   --   --   --  12.8  --  12.3  HCT 37.5  --   --   --   --   --  37.4  --  36.3  PLT 261  --   --   --   --   --  257  --  257  APTT  --    < >  --  32  --   --  103* 54* 73*  LABPROT  --   --   --  13.4  --   --   --   --   --   INR  --   --   --  1.1  --   --   --   --   --   HEPARINUNFRC  --   --   --  0.69  --   --  1.00*  --  0.52  CREATININE 0.89  --   --   --   --   --  0.82  --  0.93  TROPONINIHS 204*   < > 199*  --  230* 251*  --   --   --    < > = values in this interval not displayed.    Estimated Creatinine Clearance: 33.4 mL/min (by C-G formula based on SCr of 0.93 mg/dL).   Medical History: Past Medical History:  Diagnosis Date  . Emphysema (subcutaneous) (surgical) resulting from a procedure    Not correct  . Emphysema lung (HCC)   . Hypertension     Medications:  Medications Prior to Admission  Medication Sig Dispense Refill Last Dose  . Cholecalciferol 50 MCG (2000 UT) CAPS Take 2,000 Units by mouth daily.   03/20/2020 at 1000  . diltiazem (CARDIZEM CD) 120 MG 24 hr capsule Take 120 mg by mouth daily.   03/20/2020 at 1000  . ELIQUIS 2.5 MG TABS tablet Take 2.5 mg by mouth 2 (two) times daily.   03/20/2020 at 1000  . furosemide (LASIX) 40 MG tablet Take 40 mg by mouth daily.   03/20/2020  at 1000  . lisinopril (ZESTRIL) 40 MG tablet Take 40 mg by mouth daily.   03/20/2020 at 1000  . lovastatin (MEVACOR) 40 MG tablet Take 40 mg by mouth daily.   03/19/2020 at 2000  . Multiple Vitamins-Minerals (VITRUM SENIOR) TABS Take by mouth.   03/20/2020 at 1000  . sertraline (ZOLOFT) 50 MG tablet Take 50 mg by mouth daily.   03/20/2020 at 1000    Assessment: Pharmacy consulted to dose heparin in this 86 year old female admitted with ACS/NSTEMI.  CrCl = 34.9 ml/min  Pt was on Eliquis 2.5 mg PO BID , last dose was on 7/7 @ 1000.   7/8 9/7  aPTT 103 HL 1 decrease infusion rate to 600 units/hr 7/8 1625 aPTT 54 7/9 0200 aPTT 73, HL 0.52, therapeutic x 1, CBC stable  Goal of Therapy:  Heparin level 0.3-0.7 units/ml aPTT 66 - 102s seconds Monitor platelets by anticoagulation protocol: Yes   Plan:  APTT and HL therapeutic and correlate. Will continue heparin infusion at 700 units/hr.  Recheck HL level in 6 hours to confirm. Will use HL to manage Heparin since now correlate w/ aPTT.  CBC and heparin level with AM labs.    Wayland Denis, PharmD Clinical Pharmacist 03/22/2020 4:08 AM

## 2020-03-22 NOTE — Progress Notes (Signed)
[facility] Cardiology [facility] Encounter Note  Patient: Anne Mitchell / Admit Date: 03/20/2020 / Date of Encounter: 03/22/2020, 8:57 AM   Subjective: Patient is overall feeling relatively well today with no evidence of significant congestive heart failure or anginal symptoms.  Troponin level was slightly elevated more consistent with mild non-ST elevation myocardial infarction but no apparent acute coronary syndrome.  The patient's overall echocardiogram shows apical ballooning more consistent with stress-induced cardiomyopathy rather than acute coronary syndrome.  The patient has previously been on appropriate medication management and currently will have changes. Cardiac catheterization shows and confirms apical ballooning consistent with stress-induced cardiomyopathy with moderate atherosclerosis of ostium of the circumflex and proximal right coronary artery noncritical not requiring further intervention at this time  Review of Systems: Positive for: Shortness of breath Negative for: Vision change, hearing change, syncope, dizziness, nausea, vomiting,diarrhea, bloody stool, stomach pain, cough, congestion, diaphoresis, urinary frequency, urinary pain,skin lesions, skin rashes Others previously listed  Objective: Telemetry: Normal sinus rhythm Physical Exam: Blood pressure 136/74, pulse 63, temperature 97.9 F (36.6 C), temperature source Oral, resp. rate 16, height 5\' 1"  (1.549 m), weight 51.5 kg, SpO2 95 %. Body mass index is 21.45 kg/m. General: Well developed, well nourished, in no acute distress. Head: Normocephalic, atraumatic, sclera non-icteric, no xanthomas, nares are without discharge. Neck: No apparent masses Lungs: Normal respirations with no wheezes, no rhonchi, no rales , no crackles   Heart: Regular rate and rhythm, normal S1 S2, no murmur, no rub, no gallop, PMI is normal size and placement, carotid upstroke normal without bruit, jugular venous pressure normal Abdomen:  Soft, non-tender, non-distended with normoactive bowel sounds. No hepatosplenomegaly. Abdominal aorta is normal size without bruit Extremities: No edema, no clubbing, no cyanosis, no ulcers,  Peripheral: 2+ radial, 2+ femoral, 2+ dorsal pedal pulses Neuro: Alert and oriented. Moves all extremities spontaneously. Psych:  Responds to questions appropriately with a normal affect.   Intake/Output Summary (Last 24 hours) at 03/22/2020 0857 Last data filed at 03/22/2020 05/23/2020 Gross per 24 hour  Intake 596.04 ml  Output 800 ml  Net -203.96 ml    Inpatient Medications:  . [MAR Hold] aspirin EC  81 mg Oral Daily  . [MAR Hold] furosemide  20 mg Intravenous Q12H  . [MAR Hold] lisinopril  40 mg Oral Daily  . [MAR Hold] metoprolol tartrate  12.5 mg Oral BID  . [MAR Hold] polyethylene glycol  17 g Oral Daily  . [MAR Hold] pravastatin  20 mg Oral q1800  . [MAR Hold] sodium chloride flush  3 mL Intravenous Q12H   Infusions:  . sodium chloride    . sodium chloride    . heparin Stopped (03/22/20 0808)    Labs: Recent Labs    03/21/20 0652 03/22/20 0200  NA 142 140  K 3.3* 4.6  CL 104 103  CO2 28 29  GLUCOSE 105* 108*  BUN 12 15  CREATININE 0.82 0.93  CALCIUM 9.0 9.1  MG 1.9  --    No results for input(s): AST, ALT, ALKPHOS, BILITOT, PROT, ALBUMIN in the last 72 hours. Recent Labs    03/21/20 0652 03/22/20 0200  WBC 8.4 8.1  HGB 12.8 12.3  HCT 37.4 36.3  MCV 87.4 88.5  PLT 257 257   No results for input(s): CKTOTAL, CKMB, TROPONINI in the last 72 hours. Invalid input(s): POCBNP No results for input(s): HGBA1C in the last 72 hours.   Weights: Filed Weights   03/20/20 1833 03/22/20 0605 03/22/20 0800  Weight: 57.2  kg 51.5 kg 51.5 kg     Radiology/Studies:  DG Chest 2 View  Result Date: 03/20/2020 CLINICAL DATA:  Weakness and dizziness early Tuesday morning now with pain to left chest EXAM: CHEST - 2 VIEW COMPARISON:  None. FINDINGS: There are diffusely coarsened  interstitial and bronchitic features throughout the lungs with low volumes and atelectasis. Central vascular congestion is present as well with an enlarged heart and a calcified, tortuous aorta. No pneumothorax or effusion. No acute osseous or soft tissue abnormality. Degenerative changes are present in the imaged spine and shoulders. Surgical clips along the right breast/axillary region. IMPRESSION: 1. Cardiomegaly with central vascular congestion. 2. Coarsened interstitial and bronchitic features throughout the lungs, much of which could be chronic however more hazy opacities could also suggest mild developing edema, less likely infection in the absence of appropriate clinical symptoms. Electronically Signed   By: Kreg ShropshirePrice  DeHay M.D.   On: 03/20/2020 19:31   ECHOCARDIOGRAM COMPLETE  Result Date: 03/21/2020    ECHOCARDIOGRAM REPORT   Patient Name:   Anne Mitchell Date of Exam: 03/21/2020 Medical Rec #:  [id number]      Height:       61.0 in Accession #:    [id number]     Weight:       126.0 lb Date of Birth:  Feb 08, 1932      BSA:          1.552 m Patient Age:    87 years       BP:           121/73 mmHg Patient Gender: F              HR:           62 bpm. Exam Location:  [facility] Procedure: 2D Echo, Color Doppler and Cardiac Doppler Indications:     I50.31 CHF-Acute Diastolic  History:         Patient has no prior history of Echocardiogram examinations.                  Lung emphysema; Risk Factors:Hypertension.  Sonographer:     Humphrey RollsJoan Heiss RDCS (AE) Referring Phys:  [id number] Andris BaumannHAZEL V DUNCAN Diagnosing Phys: Arnoldo HookerBruce Jamicah Anstead MD IMPRESSIONS  1. Left ventricular ejection fraction, by estimation, is 25 to 30%. The left ventricle has severely decreased function. The left ventricle demonstrates regional wall motion abnormalities (see scoring diagram/findings for description). The left ventricular internal cavity size was mildly to moderately dilated. Left ventricular diastolic parameters were normal.  2. Right ventricular  systolic function is normal. The right ventricular size is normal. There is moderately elevated pulmonary artery systolic pressure.  3. Left atrial size was mildly dilated.  4. The mitral valve is normal in structure. Mild to moderate mitral valve regurgitation.  5. The aortic valve is normal in structure. Aortic valve regurgitation is not visualized. FINDINGS  Left Ventricle: Left ventricular ejection fraction, by estimation, is 25 to 30%. The left ventricle has severely decreased function. The left ventricle demonstrates regional wall motion abnormalities. Severe hypokinesis of the left ventricular, entire myocardium. The left ventricular internal cavity size was mildly to moderately dilated. There is no left ventricular hypertrophy. Left ventricular diastolic parameters were normal. Right Ventricle: The right ventricular size is normal. No increase in right ventricular wall thickness. Right ventricular systolic function is normal. There is moderately elevated pulmonary artery systolic pressure. The tricuspid regurgitant velocity is 3.21 m/s, and with an assumed right atrial pressure of 10 mmHg, the estimated  right ventricular systolic pressure is 51.2 mmHg. Left Atrium: Left atrial size was mildly dilated. Right Atrium: Right atrial size was normal in size. Pericardium: There is no evidence of pericardial effusion. Mitral Valve: The mitral valve is normal in structure. Mild to moderate mitral valve regurgitation. MV peak gradient, 2.9 mmHg. The mean mitral valve gradient is 1.0 mmHg. Tricuspid Valve: The tricuspid valve is normal in structure. Tricuspid valve regurgitation is trivial. Aortic Valve: The aortic valve is normal in structure. Aortic valve regurgitation is not visualized. Aortic valve mean gradient measures 2.0 mmHg. Aortic valve peak gradient measures 4.2 mmHg. Aortic valve area, by VTI measures 2.28 cm. Pulmonic Valve: The pulmonic valve was normal in structure. Pulmonic valve regurgitation is not  visualized. Aorta: The aortic root and ascending aorta are structurally normal, with no evidence of dilitation. IAS/Shunts: No atrial level shunt detected by color flow Doppler.  LEFT VENTRICLE PLAX 2D LVIDd:         4.31 cm     Diastology LVIDs:         3.33 cm     LV e' lateral:   4.35 cm/s LV PW:         0.97 cm     LV E/e' lateral: 11.1 LV IVS:        0.85 cm     LV e' medial:    3.05 cm/s LVOT diam:     1.90 cm     LV E/e' medial:  15.9 LV SV:         43 LV SV Index:   28 LVOT Area:     2.84 cm  LV Volumes (MOD) LV vol d, MOD A2C: 83.8 ml LV vol d, MOD A4C: 69.5 ml LV vol s, MOD A2C: 48.5 ml LV vol s, MOD A4C: 37.8 ml LV SV MOD A2C:     35.3 ml LV SV MOD A4C:     69.5 ml LV SV MOD BP:      34.5 ml RIGHT VENTRICLE RV Basal diam:  3.11 cm LEFT ATRIUM             Index       RIGHT ATRIUM           Index LA diam:        4.70 cm 3.03 cm/m  RA Area:     14.30 cm LA Vol (A2C):   43.7 ml 28.16 ml/m RA Volume:   34.20 ml  22.04 ml/m LA Vol (A4C):   41.8 ml 26.93 ml/m LA Biplane Vol: 46.2 ml 29.77 ml/m  AORTIC VALVE                   PULMONIC VALVE AV Area (Vmax):    1.93 cm    PV Vmax:       0.99 m/s AV Area (Vmean):   1.99 cm    PV Vmean:      57.300 cm/s AV Area (VTI):     2.28 cm    PV VTI:        0.174 m AV Vmax:           103.00 cm/s PV Peak grad:  3.9 mmHg AV Vmean:          68.800 cm/s PV Mean grad:  2.0 mmHg AV VTI:            0.190 m AV Peak Grad:      4.2 mmHg AV Mean Grad:      2.0  mmHg LVOT Vmax:         70.20 cm/s LVOT Vmean:        48.200 cm/s LVOT VTI:          0.153 m LVOT/AV VTI ratio: 0.81  AORTA Ao Root diam: 2.90 cm MITRAL VALVE               TRICUSPID VALVE MV Area (PHT): 3.68 cm    TR Peak grad:   41.2 mmHg MV Peak grad:  2.9 mmHg    TR Vmax:        321.00 cm/s MV Mean grad:  1.0 mmHg MV Vmax:       0.85 m/s    SHUNTS MV Vmean:      49.6 cm/s   Systemic VTI:  0.15 m MV Decel Time: 206 msec    Systemic Diam: 1.90 cm MV E velocity: 48.50 cm/s MV A velocity: 75.20 cm/s MV E/A ratio:  0.64  Arnoldo Hooker MD Electronically signed by Arnoldo Hooker MD Signature Date/Time: 03/21/2020/4:50:29 PM    Final      Assessment and Recommendation  86 y.o. female with known previous coronary atherosclerosis hypertension hyperlipidemia paroxysmal nonvalvular atrial fibrillation with acute non-ST elevation myocardial infarction and congestive heart failure most consistent with apical ballooning syndrome and or stress-induced cardiomyopathy now improved with medication management and a cardiac catheterization showing moderate atherosclerosis but no critical coronary disease requiring further intervention 1.  No further cardiac intervention or diagnostics necessary at this time 2.  Continue medication management for stress-induced and/or apical ballooning cardiomyopathy with beta-blocker and ACE inhibitor as well as potentially spironolactone 3.  Diuretic including Lasix at low dose for pulmonary edema 4.  Begin ambulation and follow-up for improvements of symptoms 5.  Cardiac rehabilitation 6.  Possible discharge to home tomorrow if ambulating well with adjustments of medication management as per above  Signed, Arnoldo Hooker M.D. FACC

## 2020-03-22 NOTE — Progress Notes (Signed)
ANTICOAGULATION CONSULT NOTE   Pharmacy Consult for Heparin  Indication: chest pain/ACS  Allergies  Allergen Reactions  . Banana Swelling  . Penicillins Swelling  . Sulfa Antibiotics   . Other Swelling and Rash    "mycin" meds  Gin    Patient Measurements: Height: 5\' 1"  (154.9 cm) Weight: 51.5 kg (113 lb 8.6 oz) IBW/kg (Calculated) : 47.8 Heparin Dosing Weight: 57.2 kg   Vital Signs: Temp: 97.9 F (36.6 C) (07/09 0800) Temp Source: Oral (07/09 0800) BP: 125/67 (07/09 0907) Pulse Rate: 61 (07/09 0907)  Labs: Recent Labs    03/20/20 1838 03/20/20 1838 03/20/20 2104 03/20/20 2143 03/20/20 2143 03/20/20 2306 03/21/20 0109 03/21/20 0652 03/21/20 1625 03/22/20 0200 03/22/20 0748  HGB 12.9  --   --   --    < >  --   --  12.8  --  12.3  --   HCT 37.5  --   --   --   --   --   --  37.4  --  36.3  --   PLT 261  --   --   --   --   --   --  257  --  257  --   APTT  --   --   --  32   < >  --   --  103* 54* 73*  --   LABPROT  --   --   --  13.4  --   --   --   --   --   --   --   INR  --   --   --  1.1  --   --   --   --   --   --   --   HEPARINUNFRC  --   --   --  0.69   < >  --   --  1.00*  --  0.52 0.68  CREATININE 0.89  --   --   --   --   --   --  0.82  --  0.93  --   TROPONINIHS 204*   < > 199*  --   --  230* 251*  --   --   --   --    < > = values in this interval not displayed.    Estimated Creatinine Clearance: 33.4 mL/min (by C-G formula based on SCr of 0.93 mg/dL).   Medical History: Past Medical History:  Diagnosis Date  . Diastolic heart failure (HCC)   . Emphysema (subcutaneous) (surgical) resulting from a procedure    Not correct  . Emphysema lung (HCC)   . Hypertension     Medications:  Medications Prior to Admission  Medication Sig Dispense Refill Last Dose  . Cholecalciferol 50 MCG (2000 UT) CAPS Take 2,000 Units by mouth daily.   03/20/2020 at 1000  . diltiazem (CARDIZEM CD) 120 MG 24 hr capsule Take 120 mg by mouth daily.   03/20/2020 at  1000  . ELIQUIS 2.5 MG TABS tablet Take 2.5 mg by mouth 2 (two) times daily.   03/20/2020 at 1000  . furosemide (LASIX) 40 MG tablet Take 40 mg by mouth daily.   03/20/2020 at 1000  . lisinopril (ZESTRIL) 40 MG tablet Take 40 mg by mouth daily.   03/20/2020 at 1000  . lovastatin (MEVACOR) 40 MG tablet Take 40 mg by mouth daily.   03/19/2020 at 2000  . Multiple Vitamins-Minerals (VITRUM SENIOR) TABS Take  by mouth.   03/20/2020 at 1000  . sertraline (ZOLOFT) 50 MG tablet Take 50 mg by mouth daily.   03/20/2020 at 1000    Assessment: Pharmacy consulted to dose heparin in this 86 year old female admitted with ACS/NSTEMI.  CrCl = 34.9 ml/min  Pt was on Eliquis 2.5 mg PO BID , last dose was on 7/7 @ 1000.   7/8 0652 aPTT 103 HL 1 decrease infusion rate to 600 units/hr 7/8 1625 aPTT 54 7/9 0200 aPTT 73, HL 0.52, therapeutic x 1, CBC stable 7/9 0748 HL 0.68   Goal of Therapy:  Heparin level 0.3-0.7 units/ml aPTT 66 - 102s seconds Monitor platelets by anticoagulation protocol: Yes   Plan:  Heparin level is therapeutic. Anti-xa and aPTT seem to be correlating. Switched to anti-xa monitoring. Will continue heparin infusion at 700 units/hr. Recheck anti-xa and CBC with AM labs.   Ronnald Ramp, PharmD, BCPS Clinical Pharmacist 03/22/2020 9:16 AM

## 2020-03-22 NOTE — Plan of Care (Signed)
  Problem: Education: Goal: Knowledge of General Education information will improve Description: Including pain rating scale, medication(s)/side effects and non-pharmacologic comfort measures Outcome: Progressing   Problem: Health Behavior/Discharge Planning: Goal: Ability to manage health-related needs will improve Outcome: Progressing   Problem: Clinical Measurements: Goal: Will remain free from infection Outcome: Progressing   

## 2020-03-23 DIAGNOSIS — I5181 Takotsubo syndrome: Secondary | ICD-10-CM

## 2020-03-23 DIAGNOSIS — I10 Essential (primary) hypertension: Secondary | ICD-10-CM

## 2020-03-23 LAB — CBC
HCT: 39.3 % (ref 36.0–46.0)
Hemoglobin: 12.6 g/dL (ref 12.0–15.0)
MCH: 30 pg (ref 26.0–34.0)
MCHC: 32.1 g/dL (ref 30.0–36.0)
MCV: 93.6 fL (ref 80.0–100.0)
Platelets: 244 10*3/uL (ref 150–400)
RBC: 4.2 MIL/uL (ref 3.87–5.11)
RDW: 14.9 % (ref 11.5–15.5)
WBC: 8.3 10*3/uL (ref 4.0–10.5)
nRBC: 0 % (ref 0.0–0.2)

## 2020-03-23 LAB — BASIC METABOLIC PANEL
Anion gap: 11 (ref 5–15)
BUN: 17 mg/dL (ref 8–23)
CO2: 26 mmol/L (ref 22–32)
Calcium: 9.1 mg/dL (ref 8.9–10.3)
Chloride: 102 mmol/L (ref 98–111)
Creatinine, Ser: 0.94 mg/dL (ref 0.44–1.00)
GFR calc Af Amer: >60 mL/min (ref >60)
GFR calc non Af Amer: 55 mL/min — ABNORMAL LOW (ref >60)
Glucose, Bld: 95 mg/dL (ref 70–99)
Potassium: 4.4 mmol/L (ref 3.5–5.1)
Sodium: 139 mmol/L (ref 135–145)

## 2020-03-23 MED ORDER — ASPIRIN 81 MG PO TBEC: 81.0000 mg | DELAYED_RELEASE_TABLET | Freq: Every day | ORAL | 0 refills | Status: DC

## 2020-03-23 MED ORDER — SPIRONOLACTONE 25 MG PO TABS: 25.0000 mg | ORAL_TABLET | Freq: Every day | ORAL | 0 refills | Status: DC

## 2020-03-23 MED ORDER — ATORVASTATIN CALCIUM 80 MG PO TABS: 80.0000 mg | ORAL_TABLET | Freq: Every day | ORAL | 0 refills | Status: DC

## 2020-03-23 MED ORDER — METOPROLOL TARTRATE 25 MG PO TABS: 12.5000 mg | ORAL_TABLET | Freq: Two times a day (BID) | ORAL | 0 refills | Status: DC

## 2020-03-23 NOTE — Progress Notes (Signed)
[facility] Cardiology [facility] Encounter Note  Patient: Anne Mitchell / Admit Date: 03/20/2020 / Date of Encounter: 03/23/2020, 8:52 AM   Subjective: Patient is overall feeling relatively well today with no evidence of significant congestive heart failure or anginal symptoms.  Troponin level was slightly elevated more consistent with mild non-ST elevation myocardial infarction but no apparent acute coronary syndrome.  The patient's overall echocardiogram shows apical ballooning more consistent with stress-induced cardiomyopathy rather than acute coronary syndrome.  The patient has previously been on appropriate medication management and currently will have changes. Cardiac catheterization shows and confirms apical ballooning consistent with stress-induced cardiomyopathy with moderate atherosclerosis of ostium of the circumflex and proximal right coronary artery noncritical not requiring further intervention at this time Patient has tolerated her additional medication management for LV systolic dysfunction and congestive heart failure Review of Systems: Positive for: Shortness of breath Negative for: Vision change, hearing change, syncope, dizziness, nausea, vomiting,diarrhea, bloody stool, stomach pain, cough, congestion, diaphoresis, urinary frequency, urinary pain,skin lesions, skin rashes Others previously listed  Objective: Telemetry: Normal sinus rhythm Physical Exam: Blood pressure 110/74, pulse (!) 56, temperature 98 F (36.7 C), temperature source Oral, resp. rate 17, height 5\' 1"  (1.549 m), weight 56.2 kg, SpO2 100 %. Body mass index is 23.39 kg/m. General: Well developed, well nourished, in no acute distress. Head: Normocephalic, atraumatic, sclera non-icteric, no xanthomas, nares are without discharge. Neck: No apparent masses Lungs: Normal respirations with no wheezes, no rhonchi, no rales , no crackles   Heart: Regular rate and rhythm, normal S1 S2, no murmur, no rub, no  gallop, PMI is normal size and placement, carotid upstroke normal without bruit, jugular venous pressure normal Abdomen: Soft, non-tender, non-distended with normoactive bowel sounds. No hepatosplenomegaly. Abdominal aorta is normal size without bruit Extremities: No edema, no clubbing, no cyanosis, no ulcers,  Peripheral: 2+ radial, 2+ femoral, 2+ dorsal pedal pulses Neuro: Alert and oriented. Moves all extremities spontaneously. Psych:  Responds to questions appropriately with a normal affect.   Intake/Output Summary (Last 24 hours) at 03/23/2020 0852 Last data filed at 03/23/2020 0523 Gross per 24 hour  Intake 240 ml  Output 1550 ml  Net -1310 ml    Inpatient Medications:  . apixaban  2.5 mg Oral BID  . aspirin EC  81 mg Oral Daily  . atorvastatin  80 mg Oral Daily  . furosemide  40 mg Oral Daily  . lisinopril  40 mg Oral Daily  . metoprolol tartrate  12.5 mg Oral BID  . polyethylene glycol  17 g Oral Daily  . sodium chloride flush  3 mL Intravenous Q12H  . spironolactone  25 mg Oral Daily   Infusions:    Labs: Recent Labs    03/21/20 0652 03/21/20 0652 03/22/20 0200 03/23/20 0716  NA 142   < > 140 139  K 3.3*   < > 4.6 4.4  CL 104   < > 103 102  CO2 28   < > 29 26  GLUCOSE 105*   < > 108* 95  BUN 12   < > 15 17  CREATININE 0.82   < > 0.93 0.94  CALCIUM 9.0   < > 9.1 9.1  MG 1.9  --   --   --    < > = values in this interval not displayed.   No results for input(s): AST, ALT, ALKPHOS, BILITOT, PROT, ALBUMIN in the last 72 hours. Recent Labs    03/22/20 0200 03/23/20 0716  WBC 8.1  8.3  HGB 12.3 12.6  HCT 36.3 39.3  MCV 88.5 93.6  PLT 257 244   No results for input(s): CKTOTAL, CKMB, TROPONINI in the last 72 hours. Invalid input(s): POCBNP No results for input(s): HGBA1C in the last 72 hours.   Weights: Filed Weights   03/22/20 0605 03/22/20 0800 03/23/20 0515  Weight: 51.5 kg 51.5 kg 56.2 kg     Radiology/Studies:  DG Chest 2 View  Result Date:  03/20/2020 CLINICAL DATA:  Weakness and dizziness early Tuesday morning now with pain to left chest EXAM: CHEST - 2 VIEW COMPARISON:  None. FINDINGS: There are diffusely coarsened interstitial and bronchitic features throughout the lungs with low volumes and atelectasis. Central vascular congestion is present as well with an enlarged heart and a calcified, tortuous aorta. No pneumothorax or effusion. No acute osseous or soft tissue abnormality. Degenerative changes are present in the imaged spine and shoulders. Surgical clips along the right breast/axillary region. IMPRESSION: 1. Cardiomegaly with central vascular congestion. 2. Coarsened interstitial and bronchitic features throughout the lungs, much of which could be chronic however more hazy opacities could also suggest mild developing edema, less likely infection in the absence of appropriate clinical symptoms. Electronically Signed   By: Kreg Shropshire M.D.   On: 03/20/2020 19:31   CARDIAC CATHETERIZATION  Result Date: 03/22/2020  Prox RCA lesion is 60% stenosed.  Mid RCA lesion is 30% stenosed.  Ost Cx to Prox Cx lesion is 75% stenosed.  Prox Cx lesion is 65% stenosed.  Prox LAD lesion is 30% stenosed.  Mid LAD lesion is 20% stenosed.  86 year old female with known coronary atherosclerosis hypertension hyperlipidemia paroxysmal nonvalvular atrial fibrillation having acute systolic dysfunction congestive heart failure elevated troponin with apical ballooning by echocardiogram consistent with stress-induced cardiomyopathy rather than acute coronary syndrome Apical ballooning with ejection fraction of 25 to 30% 75% stenosis of ostial left circumflex artery 60% stenosis of right coronary artery Mild atherosclerosis of left anterior descending artery Assessment Apical ballooning and/or stress-induced cardiomyopathy with minimal elevation of troponin more consistent with heart failure rather than acute coronary syndrome Plan Medical management including Lasix  beta-blocker spironolactone and ACE inhibitor for cardiomyopathy and congestive heart failure No further cardiac intervention at this time Risk factor modification with high intensity cholesterol therapy Antiplatelet therapy Rehabilitation   ECHOCARDIOGRAM COMPLETE  Result Date: 03/21/2020    ECHOCARDIOGRAM REPORT   Patient Name:   Anne Mitchell Date of Exam: 03/21/2020 Medical Rec #:  [id number]      Height:       61.0 in Accession #:    [id number]     Weight:       126.0 lb Date of Birth:  Oct 04, 1931      BSA:          1.552 m Patient Age:    87 years       BP:           121/73 mmHg Patient Gender: F              HR:           62 bpm. Exam Location:  [facility] Procedure: 2D Echo, Color Doppler and Cardiac Doppler Indications:     I50.31 CHF-Acute Diastolic  History:         Patient has no prior history of Echocardiogram examinations.                  Lung emphysema; Risk Factors:Hypertension.  Sonographer:     Aurea Graff  Heiss RDCS (AE) Referring Phys:  [id number] Andris Baumann Diagnosing Phys: Arnoldo Hooker MD IMPRESSIONS  1. Left ventricular ejection fraction, by estimation, is 25 to 30%. The left ventricle has severely decreased function. The left ventricle demonstrates regional wall motion abnormalities (see scoring diagram/findings for description). The left ventricular internal cavity size was mildly to moderately dilated. Left ventricular diastolic parameters were normal.  2. Right ventricular systolic function is normal. The right ventricular size is normal. There is moderately elevated pulmonary artery systolic pressure.  3. Left atrial size was mildly dilated.  4. The mitral valve is normal in structure. Mild to moderate mitral valve regurgitation.  5. The aortic valve is normal in structure. Aortic valve regurgitation is not visualized. FINDINGS  Left Ventricle: Left ventricular ejection fraction, by estimation, is 25 to 30%. The left ventricle has severely decreased function. The left ventricle demonstrates  regional wall motion abnormalities. Severe hypokinesis of the left ventricular, entire myocardium. The left ventricular internal cavity size was mildly to moderately dilated. There is no left ventricular hypertrophy. Left ventricular diastolic parameters were normal. Right Ventricle: The right ventricular size is normal. No increase in right ventricular wall thickness. Right ventricular systolic function is normal. There is moderately elevated pulmonary artery systolic pressure. The tricuspid regurgitant velocity is 3.21 m/s, and with an assumed right atrial pressure of 10 mmHg, the estimated right ventricular systolic pressure is 51.2 mmHg. Left Atrium: Left atrial size was mildly dilated. Right Atrium: Right atrial size was normal in size. Pericardium: There is no evidence of pericardial effusion. Mitral Valve: The mitral valve is normal in structure. Mild to moderate mitral valve regurgitation. MV peak gradient, 2.9 mmHg. The mean mitral valve gradient is 1.0 mmHg. Tricuspid Valve: The tricuspid valve is normal in structure. Tricuspid valve regurgitation is trivial. Aortic Valve: The aortic valve is normal in structure. Aortic valve regurgitation is not visualized. Aortic valve mean gradient measures 2.0 mmHg. Aortic valve peak gradient measures 4.2 mmHg. Aortic valve area, by VTI measures 2.28 cm. Pulmonic Valve: The pulmonic valve was normal in structure. Pulmonic valve regurgitation is not visualized. Aorta: The aortic root and ascending aorta are structurally normal, with no evidence of dilitation. IAS/Shunts: No atrial level shunt detected by color flow Doppler.  LEFT VENTRICLE PLAX 2D LVIDd:         4.31 cm     Diastology LVIDs:         3.33 cm     LV e' lateral:   4.35 cm/s LV PW:         0.97 cm     LV E/e' lateral: 11.1 LV IVS:        0.85 cm     LV e' medial:    3.05 cm/s LVOT diam:     1.90 cm     LV E/e' medial:  15.9 LV SV:         43 LV SV Index:   28 LVOT Area:     2.84 cm  LV Volumes (MOD) LV  vol d, MOD A2C: 83.8 ml LV vol d, MOD A4C: 69.5 ml LV vol s, MOD A2C: 48.5 ml LV vol s, MOD A4C: 37.8 ml LV SV MOD A2C:     35.3 ml LV SV MOD A4C:     69.5 ml LV SV MOD BP:      34.5 ml RIGHT VENTRICLE RV Basal diam:  3.11 cm LEFT ATRIUM             Index  RIGHT ATRIUM           Index LA diam:        4.70 cm 3.03 cm/m  RA Area:     14.30 cm LA Vol (A2C):   43.7 ml 28.16 ml/m RA Volume:   34.20 ml  22.04 ml/m LA Vol (A4C):   41.8 ml 26.93 ml/m LA Biplane Vol: 46.2 ml 29.77 ml/m  AORTIC VALVE                   PULMONIC VALVE AV Area (Vmax):    1.93 cm    PV Vmax:       0.99 m/s AV Area (Vmean):   1.99 cm    PV Vmean:      57.300 cm/s AV Area (VTI):     2.28 cm    PV VTI:        0.174 m AV Vmax:           103.00 cm/s PV Peak grad:  3.9 mmHg AV Vmean:          68.800 cm/s PV Mean grad:  2.0 mmHg AV VTI:            0.190 m AV Peak Grad:      4.2 mmHg AV Mean Grad:      2.0 mmHg LVOT Vmax:         70.20 cm/s LVOT Vmean:        48.200 cm/s LVOT VTI:          0.153 m LVOT/AV VTI ratio: 0.81  AORTA Ao Root diam: 2.90 cm MITRAL VALVE               TRICUSPID VALVE MV Area (PHT): 3.68 cm    TR Peak grad:   41.2 mmHg MV Peak grad:  2.9 mmHg    TR Vmax:        321.00 cm/s MV Mean grad:  1.0 mmHg MV Vmax:       0.85 m/s    SHUNTS MV Vmean:      49.6 cm/s   Systemic VTI:  0.15 m MV Decel Time: 206 msec    Systemic Diam: 1.90 cm MV E velocity: 48.50 cm/s MV A velocity: 75.20 cm/s MV E/A ratio:  0.64 Arnoldo Hooker MD Electronically signed by Arnoldo Hooker MD Signature Date/Time: 03/21/2020/4:50:29 PM    Final      Assessment and Recommendation  86 y.o. female with known previous coronary atherosclerosis hypertension hyperlipidemia paroxysmal nonvalvular atrial fibrillation with acute non-ST elevation myocardial infarction and congestive heart failure most consistent with apical ballooning syndrome and or stress-induced cardiomyopathy now improved with medication management and a cardiac catheterization showing  moderate atherosclerosis but no critical coronary disease requiring further intervention 1.  No further cardiac intervention or diagnostics necessary at this time 2.  Continue medication management for stress-induced and/or apical ballooning cardiomyopathy with beta-blocker and ACE inhibitor and spironolactone  3.  Diuretic including Lasix at low dose for pulmonary edema with discharge dose at 20 mg each day 4. Patient has been ambulating well and will likely be able to be discharged to home with close follow-up with adjustments of medication management next week 5.  Cardiac rehabilitation has been ordered 6. Reinstatement of Eliquis at 2.5 mg twice per day at discharge for risk reduction in stroke with atrial fibrillation with use of aspirin 81 mg due to coronary atherosclerosis and above issue 7. Call if further questions otherwise would assume patient is being  discharged today with follow-up next week  Signed, Arnoldo HookerBruce Ralphie Lovelady M.D. FACC

## 2020-03-23 NOTE — Progress Notes (Signed)
Patient discharged to home with family.  Tele and IV d/c'd.  Verbalizes understanding of discharge instructions and post cath care.

## 2020-03-23 NOTE — Discharge Instructions (Signed)
Changed lovastatin to atorvastatin Stop cardizem cd

## 2020-03-23 NOTE — Discharge Summary (Signed)
[facility] [place] [facility]   PATIENT NAME: Anne Mitchell    MR#:  [id number]  DATE OF BIRTH:  1932/01/10  DATE OF ADMISSION:  03/20/2020 ADMITTING PHYSICIAN: Andris BaumannHazel Duncan V, MD  DATE OF DISCHARGE: 03/23/2020 12:16 PM  PRIMARY CARE PHYSICIAN: Patient, No Pcp Per    ADMISSION DIAGNOSIS:  Elevated troponin [R77.8] NSTEMI (non-ST elevated myocardial infarction) (HCC) [I21.4] Generalized weakness [R53.1] Acute systolic CHF (congestive heart failure) (HCC) [I50.21]  DISCHARGE DIAGNOSIS:  Principal Problem:   NSTEMI (non-ST elevated myocardial infarction) (HCC) Active Problems:   Essential hypertension   AF (paroxysmal atrial fibrillation) (HCC)   Acute on chronic diastolic CHF (congestive heart failure) (HCC)   Chronic anticoagulation   Emphysema lung (HCC)   Rapid atrial fibrillation (HCC)   Acute systolic CHF (congestive heart failure) (HCC)   SECONDARY DIAGNOSIS:   Past Medical History:  Diagnosis Date  . Diastolic heart failure (HCC)   . Emphysema (subcutaneous) (surgical) resulting from a procedure    Not correct  . Emphysema lung (HCC)   . Hypertension     HOSPITAL COURSE:   1.  NSTEMI secondary to apical ballooning seen on cardiac catheterization.  The patient does have cardiac disease on cardiac catheterization but no blockage required a stent.  Patient on aspirin, statin and beta-blocker.  Hopefully can drop off aspirin soon because the patient is already on Eliquis. 2.  Acute systolic congestive heart failure EF 25 to 30% on echocardiogram.  The patient received IV Lasix.  Patient put back on her normal 40 mg of Lasix.  Spironolactone is added.  She is already on an ACE inhibitor and beta-blocker.  Lungs are clear upon discharge.  Referral to the CHF clinic. 3.  Paroxysmal atrial fibrillation.  Patient had an episode of rapid atrial flutter on presentation converted on Cardizem.  Patient on low-dose beta-blocker.  Anticoagulated with  Eliquis. 4.  Essential hypertension.  Patient on ACE inhibitor, spironolactone, beta-blocker and Lasix. 5.  COPD with emphysema.  No wheezing or shortness of breath. 6.  Acquired thrombophilia secondary to atrial fibrillation and higher risk of thrombus, clot and stroke.  Eliquis for stroke reduction.  DISCHARGE CONDITIONS:  Satisfactory  CONSULTS OBTAINED:  Cardiology  DRUG ALLERGIES:   Allergies  Allergen Reactions  . Banana Swelling  . Penicillins Swelling  . Sulfa Antibiotics   . Other Swelling and Rash    "mycin" meds  Gin    DISCHARGE MEDICATIONS:   Allergies as of 03/23/2020      Reactions   Banana Swelling   Penicillins Swelling   Sulfa Antibiotics    Other Swelling, Rash   "mycin" meds Gin      Medication List    STOP taking these medications   diltiazem 120 MG 24 hr capsule Commonly known as: CARDIZEM CD   lovastatin 40 MG tablet Commonly known as: MEVACOR     TAKE these medications   aspirin 81 MG EC tablet Take 1 tablet (81 mg total) by mouth daily. Swallow whole.   atorvastatin 80 MG tablet Commonly known as: LIPITOR Take 1 tablet (80 mg total) by mouth daily.   Cholecalciferol 50 MCG (2000 UT) Caps Take 2,000 Units by mouth daily.   Eliquis 2.5 MG Tabs tablet Generic drug: apixaban Take 2.5 mg by mouth 2 (two) times daily.   furosemide 40 MG tablet Commonly known as: LASIX Take 40 mg by mouth daily.   lisinopril 40 MG tablet Commonly known as: ZESTRIL Take 40 mg by  mouth daily.   metoprolol tartrate 25 MG tablet Commonly known as: LOPRESSOR Take 0.5 tablets (12.5 mg total) by mouth 2 (two) times daily.   sertraline 50 MG tablet Commonly known as: ZOLOFT Take 50 mg by mouth daily.   spironolactone 25 MG tablet Commonly known as: ALDACTONE Take 1 tablet (25 mg total) by mouth daily.   Vitrum Senior Tabs Take by mouth.        DISCHARGE INSTRUCTIONS:   Follow-up cardiology 5 days Follow-up CHF clinic  If you  experience worsening of your admission symptoms, develop shortness of breath, life threatening emergency, suicidal or homicidal thoughts you must seek medical attention immediately by calling 911 or calling your MD immediately  if symptoms less severe.  You Must read complete instructions/literature along with all the possible adverse reactions/side effects for all the Medicines you take and that have been prescribed to you. Take any new Medicines after you have completely understood and accept all the possible adverse reactions/side effects.   Please note  You were cared for by a hospitalist during your hospital stay. If you have any questions about your discharge medications or the care you received while you were in the hospital after you are discharged, you can call the unit and asked to speak with the hospitalist on call if the hospitalist that took care of you is not available. Once you are discharged, your primary care physician will handle any further medical issues. Please note that NO REFILLS for any discharge medications will be authorized once you are discharged, as it is imperative that you return to your primary care physician (or establish a relationship with a primary care physician if you do not have one) for your aftercare needs so that they can reassess your need for medications and monitor your lab values.    Today   CHIEF COMPLAINT:   Chief Complaint  Patient presents with  . Weakness  . Chest Pain  . Dizziness    HISTORY OF PRESENT ILLNESS:  Anne Mitchell  is a 86 y.o. female came in with weakness chest pain and dizziness   VITAL SIGNS:  Blood pressure (!) 115/54, pulse (!) 49, temperature 98.1 F (36.7 C), resp. rate 16, height 5\' 1"  (1.549 m), weight 56.2 kg, SpO2 95 %.    PHYSICAL EXAMINATION:  GENERAL:  86 y.o.-year-old patient lying in the bed with no acute distress.  EYES: Pupils equal, round, reactive to light and accommodation. No scleral icterus.  Extraocular muscles intact.  HEENT: Head atraumatic, normocephalic. Oropharynx and nasopharynx clear.  LUNGS: Normal breath sounds bilaterally, no wheezing, rales,rhonchi or crepitation. No use of accessory muscles of respiration.  CARDIOVASCULAR: S1, S2 normal. No murmurs, rubs, or gallops.  ABDOMEN: Soft, non-tender, non-distended. EXTREMITIES: No pedal edema.  NEUROLOGIC: Cranial nerves II through XII are intact. Muscle strength 5/5 in all extremities. Sensation intact. Gait not checked.  PSYCHIATRIC: The patient is alert and oriented x 3.  SKIN: No obvious rash, lesion, or ulcer.   DATA REVIEW:   CBC Recent Labs  Lab 03/23/20 0716  WBC 8.3  HGB 12.6  HCT 39.3  PLT 244    Chemistries  Recent Labs  Lab 03/21/20 0652 03/22/20 0200 03/23/20 0716  NA 142   < > 139  K 3.3*   < > 4.4  CL 104   < > 102  CO2 28   < > 26  GLUCOSE 105*   < > 95  BUN 12   < > 17  CREATININE 0.82   < > 0.94  CALCIUM 9.0   < > 9.1  MG 1.9  --   --    < > = values in this interval not displayed.    Microbiology Results  Results for orders placed or performed during the hospital encounter of 03/20/20  SARS Coronavirus 2 by RT PCR (hospital order, performed in [facility] hospital lab) Nasopharyngeal Nasopharyngeal Swab     Status: None   Collection Time: 03/20/20  9:43 PM   Specimen: Nasopharyngeal Swab  Result Value Ref Range Status   SARS Coronavirus 2 NEGATIVE NEGATIVE Final    Comment: (NOTE) SARS-CoV-2 target nucleic acids are NOT DETECTED.  The SARS-CoV-2 RNA is generally detectable in upper and lower respiratory specimens during the acute phase of infection. The lowest concentration of SARS-CoV-2 viral copies this assay can detect is 250 copies / mL. A negative result does not preclude SARS-CoV-2 infection and should not be used as the sole basis for treatment or other patient management decisions.  A negative result may occur with improper specimen collection / handling, submission  of specimen other than nasopharyngeal swab, presence of viral mutation(s) within the areas targeted by this assay, and inadequate number of viral copies (<250 copies / mL). A negative result must be combined with clinical observations, patient history, and epidemiological information.  Fact Sheet for Patients:   BoilerBrush.com.cy  Fact Sheet for Healthcare Providers: https://pope.com/  This test is not yet approved or  cleared by the [place] FDA and has been authorized for detection and/or diagnosis of SARS-CoV-2 by FDA under an Emergency Use Authorization (EUA).  This EUA will remain in effect (meaning this test can be used) for the duration of the COVID-19 declaration under Section 564(b)(1) of the Act, 21 U.S.C. section 360bbb-3(b)(1), unless the authorization is terminated or revoked sooner.  Performed at [facility], [address] [place], [place] [address]     RADIOLOGY:  CARDIAC CATHETERIZATION  Result Date: 03/22/2020  Prox RCA lesion is 60% stenosed.  Mid RCA lesion is 30% stenosed.  Ost Cx to Prox Cx lesion is 75% stenosed.  Prox Cx lesion is 65% stenosed.  Prox LAD lesion is 30% stenosed.  Mid LAD lesion is 20% stenosed.  86 year old female with known coronary atherosclerosis hypertension hyperlipidemia paroxysmal nonvalvular atrial fibrillation having acute systolic dysfunction congestive heart failure elevated troponin with apical ballooning by echocardiogram consistent with stress-induced cardiomyopathy rather than acute coronary syndrome Apical ballooning with ejection fraction of 25 to 30% 75% stenosis of ostial left circumflex artery 60% stenosis of right coronary artery Mild atherosclerosis of left anterior descending artery Assessment Apical ballooning and/or stress-induced cardiomyopathy with minimal elevation of troponin more consistent with heart failure rather than acute coronary syndrome Plan  Medical management including Lasix beta-blocker spironolactone and ACE inhibitor for cardiomyopathy and congestive heart failure No further cardiac intervention at this time Risk factor modification with high intensity cholesterol therapy Antiplatelet therapy Rehabilitation    Management plans discussed with the patient, family and they are in agreement.  CODE STATUS:     Code Status Orders  (From admission, onward)         Start     Ordered   03/20/20 2127  Full code  Continuous        03/20/20 2134        Code Status History    This patient has a current code status but no historical code status.   Advance Care Planning Activity  Advance Directive Documentation     Most Recent Value  Type of Advance Directive Healthcare Power of Attorney  Pre-existing out of facility DNR order (yellow form or pink MOST form) --  "MOST" Form in Place? --      TOTAL TIME TAKING CARE OF THIS PATIENT: 35 minutes.    Alford Highland M.D on 03/23/2020 at 5:19 PM  Between 7am to 6pm - Pager - [phone]  After 6pm go to [email or web address] - password [email or web address]  [facility]  CC: Primary care physician; Patient, No Pcp Per

## 2020-03-29 ENCOUNTER — Telehealth: Payer: Self-pay | Admitting: Family

## 2020-04-02 NOTE — Telephone Encounter (Signed)
Unable to reach patient regarding her new patient appointment in the CHF Clinic that was scheduled while patient was in the hospital.    Joice Lofts, [place]

## 2020-04-17 ENCOUNTER — Ambulatory Visit: Payer: Medicare Other | Admitting: Family

## 2020-07-23 ENCOUNTER — Other Ambulatory Visit: Payer: Self-pay

## 2020-07-23 ENCOUNTER — Encounter: Payer: Self-pay | Admitting: Emergency Medicine

## 2020-07-23 ENCOUNTER — Emergency Department
Admission: EM | Admit: 2020-07-23 | Discharge: 2020-07-23 | Disposition: A | Discharge status: Home / Self Care | Payer: Medicare Other | Attending: Emergency Medicine | Admitting: Emergency Medicine

## 2020-07-23 DIAGNOSIS — Z79899 Other long term (current) drug therapy: Secondary | ICD-10-CM | POA: Insufficient documentation

## 2020-07-23 DIAGNOSIS — I5021 Acute systolic (congestive) heart failure: Secondary | ICD-10-CM | POA: Diagnosis not present

## 2020-07-23 DIAGNOSIS — R001 Bradycardia, unspecified: Secondary | ICD-10-CM | POA: Insufficient documentation

## 2020-07-23 DIAGNOSIS — Z7982 Long term (current) use of aspirin: Secondary | ICD-10-CM | POA: Diagnosis not present

## 2020-07-23 DIAGNOSIS — I110 Hypertensive heart disease with heart failure: Secondary | ICD-10-CM | POA: Diagnosis not present

## 2020-07-23 LAB — CBC
HCT: 37.8 % (ref 36.0–46.0)
Hemoglobin: 12.4 g/dL (ref 12.0–15.0)
MCH: 30.7 pg (ref 26.0–34.0)
MCHC: 32.8 g/dL (ref 30.0–36.0)
MCV: 93.6 fL (ref 80.0–100.0)
Platelets: 256 10*3/uL (ref 150–400)
RBC: 4.04 MIL/uL (ref 3.87–5.11)
RDW: 14.9 % (ref 11.5–15.5)
WBC: 9.1 10*3/uL (ref 4.0–10.5)
nRBC: 0 % (ref 0.0–0.2)

## 2020-07-23 LAB — BASIC METABOLIC PANEL
Anion gap: 9 (ref 5–15)
BUN: 16 mg/dL (ref 8–23)
CO2: 25 mmol/L (ref 22–32)
Calcium: 9.3 mg/dL (ref 8.9–10.3)
Chloride: 104 mmol/L (ref 98–111)
Creatinine, Ser: 0.92 mg/dL (ref 0.44–1.00)
GFR, Estimated: >60 mL/min (ref >60)
Glucose, Bld: 109 mg/dL — ABNORMAL HIGH (ref 70–99)
Potassium: 4.7 mmol/L (ref 3.5–5.1)
Sodium: 138 mmol/L (ref 135–145)

## 2020-07-23 NOTE — ED Provider Notes (Signed)
[facility] Emergency Department Provider Note ____________________________________________   First MD Initiated Contact with Patient 07/23/20 1343     (approximate)  I have reviewed the triage vital signs and the nursing notes.  HISTORY  Chief Complaint Bradycardia and Hypotension   HPI Anne Mitchell is a 86 y.o. femalewho presents to the ED for evaluation of bradycardia and hypotension at home.  Chart review indicates history of diastolic CHF, HTN, paroxysmal A. fib on Eliquis, HTN, HLD.Marland Kitchen Hospitalized 4 months ago for Takotsubo's cardiomyopathy/NSTEMI.  Left heart cath stigmata of Takotsubo's, EF of 25-30%.  75% stenosis to ostial left circumflex and 60% stenosis to RCA. Follows with Dr. Gwen Pounds.  Home health called cardiologist office today due to hypotension and bradycardia, directed to the ED.   Patient takes Metoprolol 12.5mg  as only AV nodal blocker.   Patient reports she feels fine has no complaints.  She questions why she is even in the ED for evaluation.  She denies any chest pain, syncope, palpitations, shortness of breath, trauma or falls.  With questioning, she does report one episode of weakness that occurred last night where she required the assistance of her daughter to get up the single step into their home.   Past Medical History:  Diagnosis Date  . Diastolic heart failure (HCC)   . Emphysema (subcutaneous) (surgical) resulting from a procedure    Not correct  . Emphysema lung (HCC)   . Hypertension     Patient Active Problem List   Diagnosis Date Noted  . Apical ballooning syndrome   . Acute systolic CHF (congestive heart failure) (HCC) 03/21/2020  . Essential hypertension 03/20/2020  . NSTEMI (non-ST elevated myocardial infarction) (HCC) 03/20/2020  . AF (paroxysmal atrial fibrillation) (HCC) 03/20/2020  . Acute on chronic diastolic CHF (congestive heart failure) (HCC) 03/20/2020  . Chronic anticoagulation 03/20/2020  .  Emphysema lung (HCC) 03/20/2020  . Rapid atrial fibrillation (HCC) 03/20/2020    Past Surgical History:  Procedure Laterality Date  . LEFT HEART CATH AND CORONARY ANGIOGRAPHY N/A 03/22/2020   Procedure: LEFT HEART CATH AND CORONARY ANGIOGRAPHY;  Surgeon: Lamar Blinks, MD;  Location: [facility] INVASIVE CV LAB;  Service: Cardiovascular;  Laterality: N/A;    Prior to Admission medications   Medication Sig Start Date End Date Taking? Authorizing Provider  aspirin EC 81 MG EC tablet Take 1 tablet (81 mg total) by mouth daily. Swallow whole. 03/23/20   Alford Highland, MD  atorvastatin (LIPITOR) 80 MG tablet Take 1 tablet (80 mg total) by mouth daily. 03/23/20   Alford Highland, MD  Cholecalciferol 50 MCG (2000 UT) CAPS Take 2,000 Units by mouth daily.    [provider]  ELIQUIS 2.5 MG TABS tablet Take 2.5 mg by mouth 2 (two) times daily. 01/15/20   [provider]  furosemide (LASIX) 40 MG tablet Take 40 mg by mouth daily. 02/20/20   [provider]  lisinopril (ZESTRIL) 40 MG tablet Take 40 mg by mouth daily. 02/20/20   [provider]  metoprolol tartrate (LOPRESSOR) 25 MG tablet Take 0.5 tablets (12.5 mg total) by mouth 2 (two) times daily. 03/23/20   Alford Highland, MD  Multiple Vitamins-Minerals (VITRUM SENIOR) TABS Take by mouth.    [provider]  sertraline (ZOLOFT) 50 MG tablet Take 50 mg by mouth daily. 01/12/20   [provider]  spironolactone (ALDACTONE) 25 MG tablet Take 1 tablet (25 mg total) by mouth daily. 03/23/20   Alford Highland, MD    Allergies Banana,  Penicillins, Sulfa antibiotics, and Other  Family History  Problem Relation Age of Onset  . Colon cancer Mother   . Hypertension Mother     Social History Social History   Tobacco Use  . Smoking status: Never Smoker  . Smokeless tobacco: Never Used  Substance Use Topics  . Alcohol use: Never  . Drug use: Never    Review of Systems  Constitutional: No  fever/chills Eyes: No visual changes. ENT: No sore throat. Cardiovascular: Denies chest pain. Respiratory: Denies shortness of breath. Gastrointestinal: No abdominal pain.  No nausea, no vomiting.  No diarrhea.  No constipation. Genitourinary: Negative for dysuria. Musculoskeletal: Negative for back pain. Skin: Negative for rash. Neurological: Negative for headaches, focal weakness or numbness.  ____________________________________________   PHYSICAL EXAM:  VITAL SIGNS: Vitals:   07/23/20 1430 07/23/20 1501  BP: (!) 144/55 (!) 136/48  Pulse: (!) 54 (!) 47  Resp: 15 18  Temp:  97.8 F (36.6 C)  SpO2: 97% 96%     Constitutional: Alert and oriented. Well appearing and in no acute distress. Eyes: Conjunctivae are normal. PERRL. EOMI. Head: Atraumatic. Nose: No congestion/rhinnorhea. Mouth/Throat: Mucous membranes are moist.  Oropharynx non-erythematous. Neck: No stridor. No cervical spine tenderness to palpation. Cardiovascular: Bradycardic rate, regular rhythm. Grossly normal heart sounds.  Good peripheral circulation. Respiratory: Normal respiratory effort.  No retractions. Lungs CTAB. Gastrointestinal: Soft , nondistended, nontender to palpation. No CVA tenderness. Musculoskeletal: No lower extremity tenderness nor edema.  No joint effusions. No signs of acute trauma. Neurologic:  Normal speech and language. No gross focal neurologic deficits are appreciated. No gait instability noted. Skin:  Skin is warm, dry and intact. No rash noted. Psychiatric: Mood and affect are normal. Speech and behavior are normal.  ____________________________________________   LABS (all labs ordered are listed, but only abnormal results are displayed)  Labs Reviewed  BASIC METABOLIC PANEL - Abnormal; Notable for the following components:      Result Value   Glucose, Bld 109 (*)    All other components within normal limits  CBC  URINALYSIS, COMPLETE (UACMP) WITH MICROSCOPIC  CBG  MONITORING, ED   ____________________________________________  12 Lead EKG Sinus rhythm, rate of 39 bpm.  Normal axis.  Normal intervals.  No evidence of acute ischemia.  Sinus bradycardia. ____________________________________________   PROCEDURES and INTERVENTIONS  Procedure(s) performed (including Critical Care):  .1-3 Lead EKG Interpretation Performed by: Delton Prairie, MD Authorized by: Delton Prairie, MD     Interpretation: abnormal     ECG rate:  42   ECG rate assessment: bradycardic     Rhythm: sinus bradycardia     Ectopy: none     Conduction: normal      Medications - No data to display  ____________________________________________   MDM / ED COURSE   Pleasant 86 year old woman presents to the ED asymptomatic with sinus bradycardia, amenable to outpatient management with discontinuation of her metoprolol and following up with cardiology.  Patient remains in a sinus bradycardia with rates ranging between the high 30s in the mid 50s in an asymptomatic fashion.  Blood pressure normotensive to slightly hypertensive.  Her blood work, including electrolytes, are unremarkable.  EKG is shows a nonischemic sinus bradycardia without evidence of AV block.  Discussed the case with cardiologist on-call who recommends discontinuation of metoprolol and possible outpatient management.  I discussed with the patient and her daughter, and they are eager for outpatient management, which I think is reasonable considering her asymptomatic and hemodynamically stable nature.  We  thoroughly discussed discontinuation of metoprolol and we discussed return precautions for the ED.  Patient has an appointment tomorrow morning at 9:30 AM with her cardiologist.  Medic stable for discharge home.   Clinical Course as of Jul 23 1654  Tue Jul 23, 2020  1431 Dr. Lady Gary paged   [DS]  1434 Immediate callback.  Tomorrow at 9:30am clinic visit with Dr. Gwen Pounds. Stop metop.    [DS]  1452 Reassessed.  Patient  reports that she continues to be without symptoms.  Daughter now in the room.  We discussed recommendations to discontinue metoprolol.  Shared decision-making on medical observation admission versus discharge with clinic follow-up tomorrow.  Daughter and patient agree on discharge with close clinic follow-up.  We discussed return precautions for the ED.  Answered questions.   [DS]    Clinical Course User Index [DS] Delton Prairie, MD    ____________________________________________   FINAL CLINICAL IMPRESSION(S) / ED DIAGNOSES  Final diagnoses:  Sinus bradycardia     ED Discharge Orders    None       Ernesta Trabert Katrinka Blazing   Note:  This document was prepared using Dragon voice recognition software and may include unintentional dictation errors.   Delton Prairie, MD 07/23/20 [phone]

## 2020-07-23 NOTE — ED Notes (Signed)
EKG brady 39

## 2020-07-23 NOTE — ED Triage Notes (Signed)
Pt in via EMS w/reported bradycardia - states her home health RN came out and checked VS. BP lowest at 70/40 and HR in 40's. Pt denies any dizziness or faintness. Feels her normal self today. 174/53 and HR 45 in triage

## 2020-07-23 NOTE — Discharge Instructions (Signed)
As we discussed, please stop taking metoprolol altogether.  Continue your other medications as prescribed.  You have an appointment tomorrow morning, Wednesday, at 9:30 AM in the office with Dr. Gwen Pounds for a recheck.  Please ensure you make this appointment.  In the meantime, if you develop any significantly worsening symptoms, episodes of passing out or chest pain, please return to the ED.

## 2021-08-25 IMAGING — CR DG CHEST 2V
1 series · 2 of 2 positions shown · non-contrast
Comparison: None.

CLINICAL DATA: Weakness and dizziness early [REDACTED] morning now
with pain to left chest

EXAM:
CHEST - 2 VIEW

[Series 1: dg chest 2 view · 0.14mm/px · 2 of 2 slices shown]
[im 1/2]
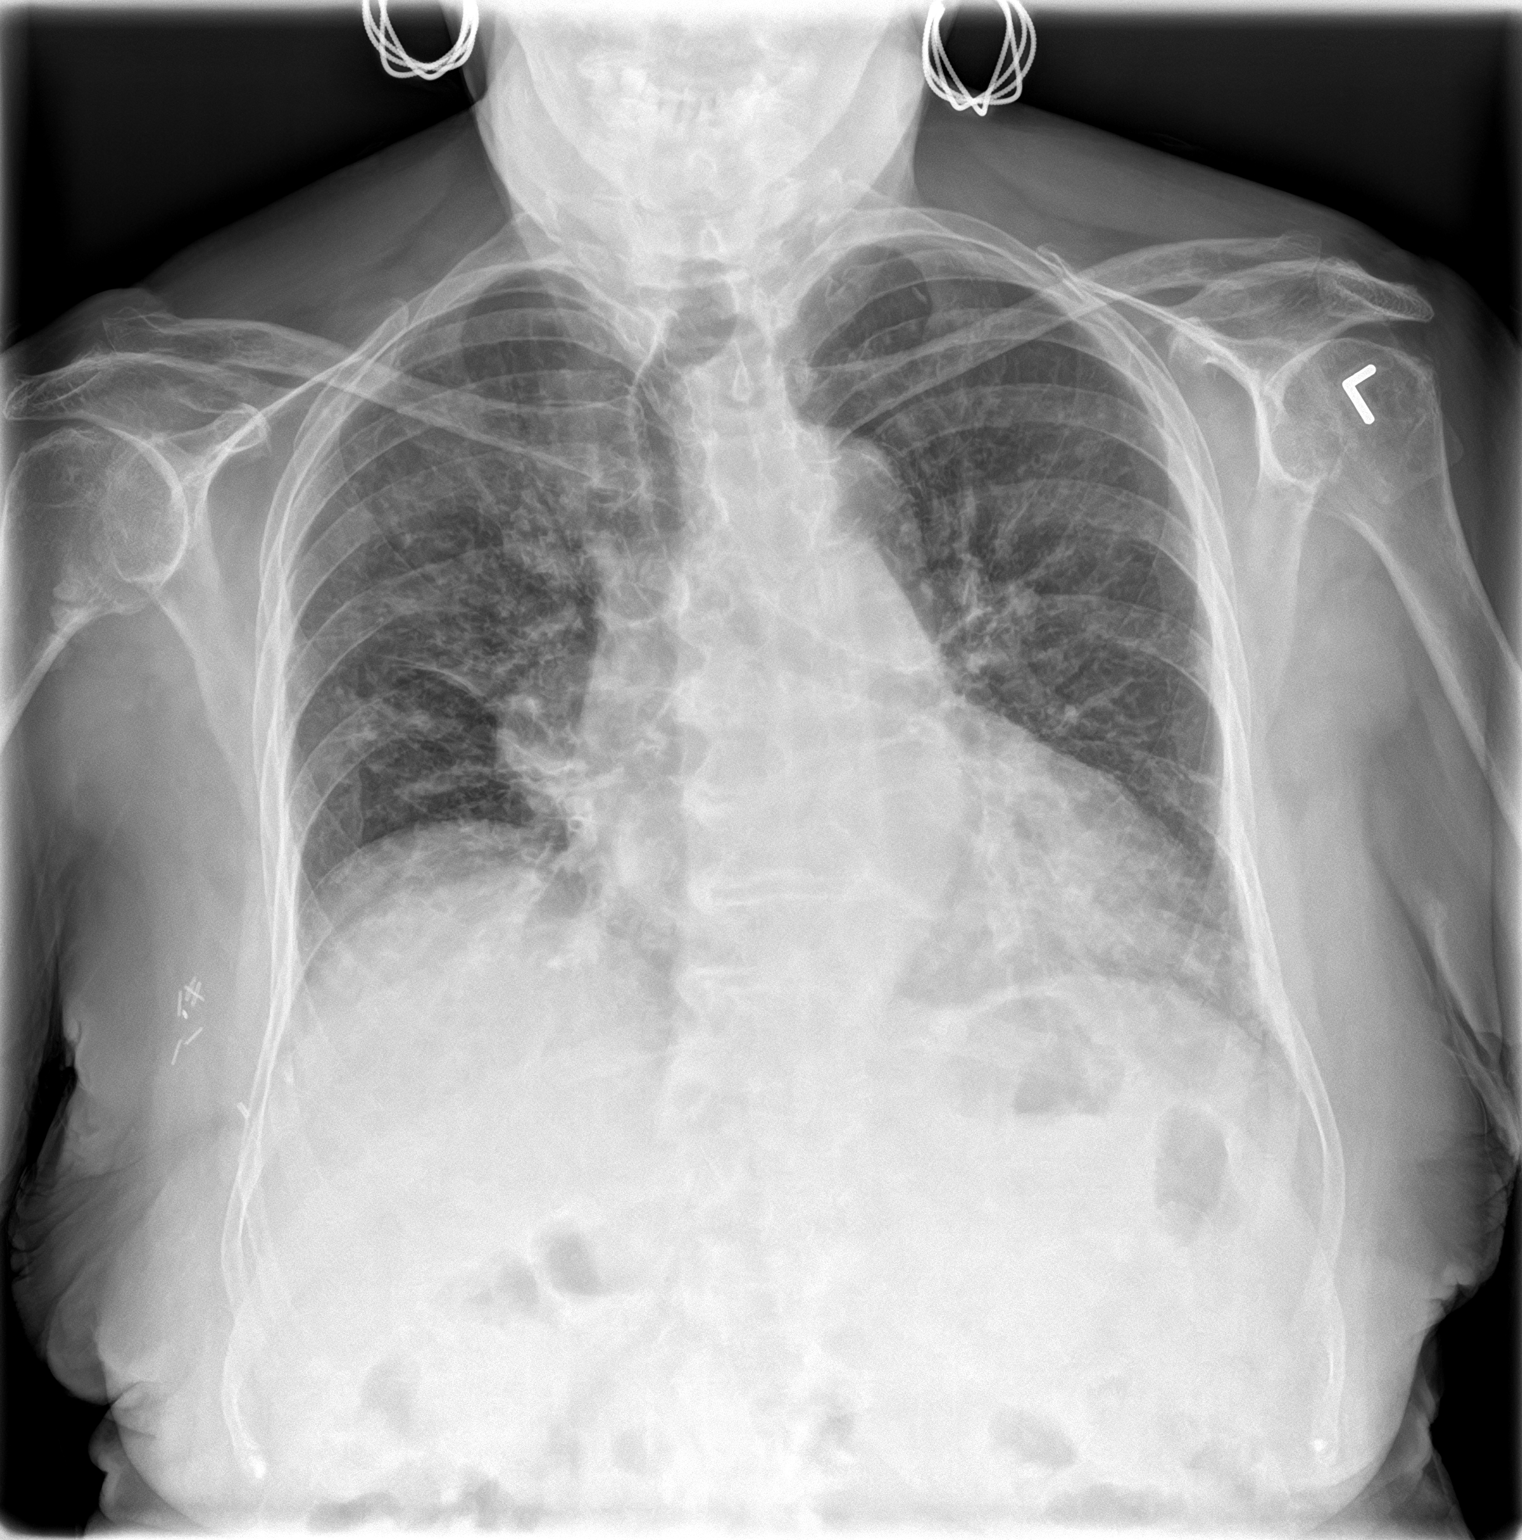
[im 2/2]
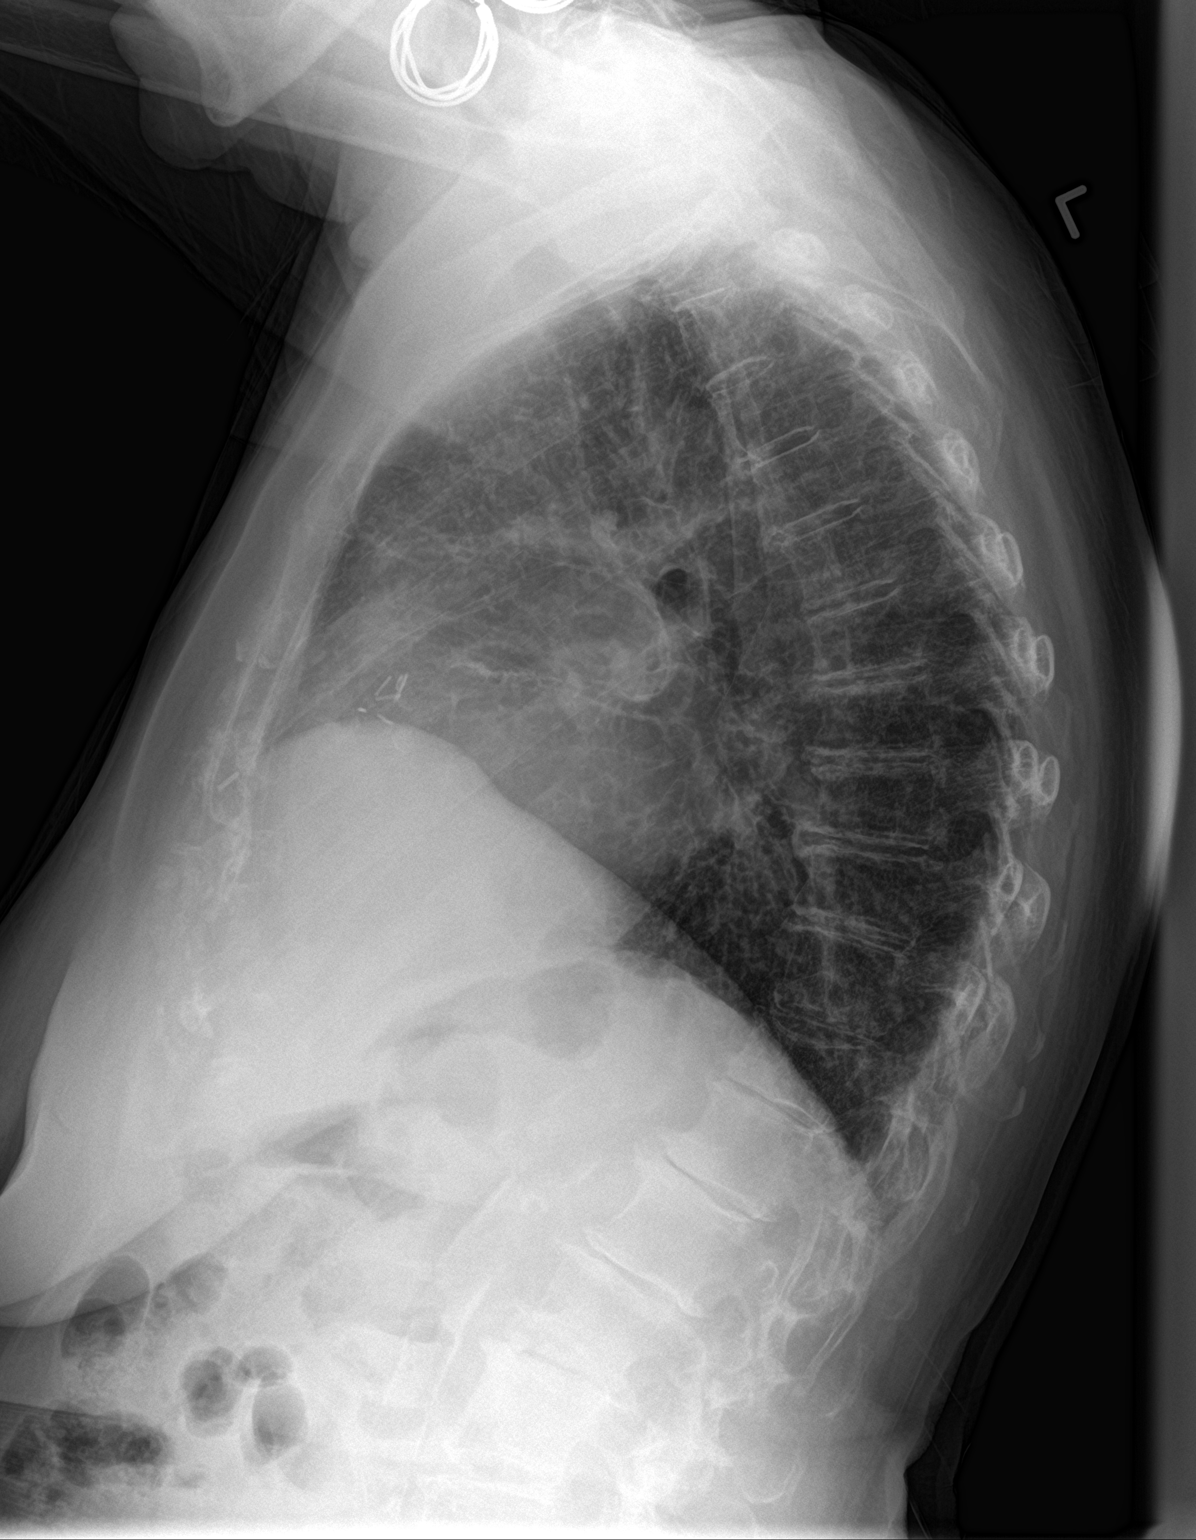

[2 of 2 positions shown; findings below may reference images not displayed]

FINDINGS: There are diffusely coarsened interstitial and bronchitic features
throughout the lungs with low volumes and atelectasis. Central
vascular congestion is present as well with an enlarged heart and a
calcified, tortuous aorta. No pneumothorax or effusion. No acute
osseous or soft tissue abnormality. Degenerative changes are present
in the imaged spine and shoulders. Surgical clips along the right
breast/axillary region.
IMPRESSION: 1. Cardiomegaly with central vascular congestion.
2. Coarsened interstitial and bronchitic features throughout the
lungs, much of which could be chronic however more hazy opacities
could also suggest mild developing edema, less likely infection in
the absence of appropriate clinical symptoms.

## 2022-09-16 ENCOUNTER — Emergency Department
Admission: EM | Admit: 2022-09-16 | Discharge: 2022-09-16 | Disposition: A | Discharge status: Home / Self Care | Payer: Medicare Other | Attending: Emergency Medicine | Admitting: Emergency Medicine

## 2022-09-16 ENCOUNTER — Emergency Department: Payer: Medicare Other

## 2022-09-16 ENCOUNTER — Other Ambulatory Visit: Payer: Self-pay

## 2022-09-16 DIAGNOSIS — J40 Bronchitis, not specified as acute or chronic: Secondary | ICD-10-CM | POA: Diagnosis not present

## 2022-09-16 DIAGNOSIS — R0602 Shortness of breath: Secondary | ICD-10-CM | POA: Diagnosis present

## 2022-09-16 DIAGNOSIS — Z1152 Encounter for screening for COVID-19: Secondary | ICD-10-CM | POA: Insufficient documentation

## 2022-09-16 LAB — RESP PANEL BY RT-PCR (RSV, FLU A&B, COVID)  RVPGX2
Influenza A by PCR: NEGATIVE
Influenza B by PCR: NEGATIVE
Resp Syncytial Virus by PCR: NEGATIVE
SARS Coronavirus 2 by RT PCR: NEGATIVE

## 2022-09-16 MED ORDER — AZITHROMYCIN 500 MG PO TABS
500.0000 mg | ORAL_TABLET | Freq: Once | ORAL | Status: AC
  Administered 2022-09-16: 500 mg via ORAL
  Filled 2022-09-16: qty 1

## 2022-09-16 MED ORDER — IPRATROPIUM-ALBUTEROL 0.5-2.5 (3) MG/3ML IN SOLN
3.0000 mL | Freq: Once | RESPIRATORY_TRACT | Status: AC
  Administered 2022-09-16: 3 mL via RESPIRATORY_TRACT
  Filled 2022-09-16: qty 3

## 2022-09-16 MED ORDER — PREDNISONE 20 MG PO TABS: 40.0000 mg | ORAL_TABLET | Freq: Every day | ORAL | 0 refills | Status: AC

## 2022-09-16 MED ORDER — PREDNISONE 20 MG PO TABS
40.0000 mg | ORAL_TABLET | Freq: Once | ORAL | Status: AC
  Administered 2022-09-16: 40 mg via ORAL
  Filled 2022-09-16: qty 2

## 2022-09-16 MED ORDER — AZITHROMYCIN 250 MG PO TABS: ORAL_TABLET | ORAL | 0 refills | Status: AC

## 2022-09-16 MED ORDER — ALBUTEROL SULFATE HFA 108 (90 BASE) MCG/ACT IN AERS: 2.0000 | INHALATION_SPRAY | Freq: Four times a day (QID) | RESPIRATORY_TRACT | 2 refills | Status: DC | PRN

## 2022-09-16 NOTE — ED Notes (Signed)
See triage note. Pt came in due to her primary being worried her RR was high (32) in the office and wanted her to come to the ED. Vitals currently stable RR normal. O2 100%.

## 2022-09-16 NOTE — ED Provider Notes (Signed)
[facility] Provider Note    Event Date/Time   First MD Initiated Contact with Patient 09/16/22 1928     (approximate)   History   Cough   HPI  Anne Mitchell is a 89 y.o. female who presents with complaints of cough, mild shortness of breath.  She has been coughing for several days but reports over the last day she has felt a little bit winded.  She denies fevers.  She does not smoke.  No chest pain.  No calf pain or swelling     Physical Exam   Triage Vital Signs: ED Triage Vitals  Enc Vitals Group     BP 09/16/22 1418 120/86     Pulse Rate 09/16/22 1418 87     Resp 09/16/22 1418 20     Temp 09/16/22 1418 97.6 F (36.4 C)     Temp Source 09/16/22 1418 Oral     SpO2 09/16/22 1418 96 %     Weight 09/16/22 1422 51.7 kg (114 lb)     Height 09/16/22 1422 1.524 m (5')     Head Circumference --      Peak Flow --      Pain Score 09/16/22 1428 0     Pain Loc --      Pain Edu? --      Excl. in [place]? --     Most recent vital signs: Vitals:   09/16/22 1844 09/16/22 2038  BP: 101/78 108/70  Pulse: 76 83  Resp: 20 19  Temp:  98 F (36.7 C)  SpO2: 100% 99%     General: Awake, no distress.  CV:  Good peripheral perfusion.  Resp:  Normal effort.  Scattered wheezes, mild, no Rales Abd:  No distention.  Other:     ED Results / Procedures / Treatments   Labs (all labs ordered are listed, but only abnormal results are displayed) Labs Reviewed  RESP PANEL BY RT-PCR (RSV, FLU A&B, COVID)  [id number]     EKG     RADIOLOGY Chest x-ray viewed interpreted by me, no pneumonia    PROCEDURES:  Critical Care performed:   Procedures   MEDICATIONS ORDERED IN ED: Medications  ipratropium-albuterol (DUONEB) 0.5-2.5 (3) MG/3ML nebulizer solution 3 mL (3 mLs Nebulization Given 09/16/22 1952)  ipratropium-albuterol (DUONEB) 0.5-2.5 (3) MG/3ML nebulizer solution 3 mL (3 mLs Nebulization Given 09/16/22 1952)  predniSONE (DELTASONE) tablet 40 mg (40  mg Oral Given 09/16/22 1952)  azithromycin (ZITHROMAX) tablet 500 mg (500 mg Oral Given 09/16/22 2037)     IMPRESSION / MDM / ASSESSMENT AND PLAN / ED COURSE  I reviewed the triage vital signs and the nursing notes. Patient's presentation is most consistent with acute complicated illness / injury requiring diagnostic workup.   Patient presents with cough, shortness of breath as above.  Differential includes upper respiratory infection, likely viral, pneumonia, bronchitis, bronchospasm  Exam she has some scattered wheezes, mild, these resolved with DuoNeb.  Respiratory panel PCR is negative, chest x-ray most consistent with bronchitis, will treat with Z-Pak, prednisone, Ventolin inhaler strict return precautions, patient agrees this plan       FINAL CLINICAL IMPRESSION(S) / ED DIAGNOSES   Final diagnoses:  Bronchitis     Rx / DC Orders   ED Discharge Orders          Ordered    azithromycin (ZITHROMAX Z-PAK) 250 MG tablet        09/16/22 2026    albuterol (VENTOLIN HFA) 108 (  90 Base) MCG/ACT inhaler  Every 6 hours PRN        09/16/22 2026    predniSONE (DELTASONE) 20 MG tablet  Daily with breakfast        09/16/22 2026             Note:  This document was prepared using Dragon voice recognition software and may include unintentional dictation errors.   Lavonia Drafts, MD 09/16/22 2239

## 2022-09-16 NOTE — ED Provider Triage Note (Signed)
Emergency Medicine Provider Triage Evaluation Note  Anne Mitchell , a 89 y.o. female  was evaluated in triage.  Pt complains of was sent to ED by doctor for tachycardia.  Patient complains of cough for 3 weeks.  Not aware of fever.   Review of Systems  Positive: cough Negative:   Physical Exam  BP 120/86 (BP Location: Left Arm)   Pulse 87   Temp 97.6 F (36.4 C) (Oral)   Resp 20   Ht 5' (1.524 m)   Wt 51.7 kg   SpO2 96%   BMI 22.26 kg/m  Gen:   Awake, no distress Resp:  Normal effort  no wheezing noted, decreased breath sounds bilaterally.   MSK:   Moves extremities without difficulty  Other:    Medical Decision Making  Medically screening exam initiated at 2:27 PM.  Appropriate orders placed.  [name] was informed that the remainder of the evaluation will be completed by another provider, this initial triage assessment does not replace that evaluation, and the importance of remaining in the ED until their evaluation is complete.     Johnn Hai, PA-C 09/16/22 1430

## 2022-09-16 NOTE — ED Triage Notes (Signed)
Productive cough x3 weeks;

## 2022-10-31 ENCOUNTER — Emergency Department: Payer: Medicare Other

## 2022-10-31 ENCOUNTER — Encounter: Payer: Self-pay | Admitting: Internal Medicine

## 2022-10-31 ENCOUNTER — Inpatient Hospital Stay
Admission: EM | Admit: 2022-10-31 | Discharge: 2022-11-07 | DRG: 228 | Disposition: A | Discharge status: Home Health | Payer: Medicare Other | Attending: Hospitalist | Admitting: Hospitalist

## 2022-10-31 ENCOUNTER — Other Ambulatory Visit: Payer: Self-pay

## 2022-10-31 DIAGNOSIS — I498 Other specified cardiac arrhythmias: Secondary | ICD-10-CM

## 2022-10-31 DIAGNOSIS — E785 Hyperlipidemia, unspecified: Secondary | ICD-10-CM | POA: Diagnosis present

## 2022-10-31 DIAGNOSIS — J439 Emphysema, unspecified: Secondary | ICD-10-CM | POA: Diagnosis present

## 2022-10-31 DIAGNOSIS — I25110 Atherosclerotic heart disease of native coronary artery with unstable angina pectoris: Secondary | ICD-10-CM | POA: Diagnosis present

## 2022-10-31 DIAGNOSIS — I959 Hypotension, unspecified: Secondary | ICD-10-CM | POA: Diagnosis present

## 2022-10-31 DIAGNOSIS — I21A1 Myocardial infarction type 2: Secondary | ICD-10-CM | POA: Diagnosis present

## 2022-10-31 DIAGNOSIS — Z7982 Long term (current) use of aspirin: Secondary | ICD-10-CM

## 2022-10-31 DIAGNOSIS — I4891 Unspecified atrial fibrillation: Secondary | ICD-10-CM | POA: Diagnosis present

## 2022-10-31 DIAGNOSIS — I10 Essential (primary) hypertension: Secondary | ICD-10-CM | POA: Diagnosis present

## 2022-10-31 DIAGNOSIS — I480 Paroxysmal atrial fibrillation: Secondary | ICD-10-CM | POA: Diagnosis present

## 2022-10-31 DIAGNOSIS — E669 Obesity, unspecified: Secondary | ICD-10-CM | POA: Diagnosis present

## 2022-10-31 DIAGNOSIS — Z800 Family history of malignant neoplasm of digestive organs: Secondary | ICD-10-CM

## 2022-10-31 DIAGNOSIS — N179 Acute kidney failure, unspecified: Secondary | ICD-10-CM | POA: Diagnosis present

## 2022-10-31 DIAGNOSIS — I252 Old myocardial infarction: Secondary | ICD-10-CM

## 2022-10-31 DIAGNOSIS — I200 Unstable angina: Secondary | ICD-10-CM

## 2022-10-31 DIAGNOSIS — I5033 Acute on chronic diastolic (congestive) heart failure: Secondary | ICD-10-CM | POA: Diagnosis not present

## 2022-10-31 DIAGNOSIS — I110 Hypertensive heart disease with heart failure: Secondary | ICD-10-CM | POA: Diagnosis not present

## 2022-10-31 DIAGNOSIS — Z7901 Long term (current) use of anticoagulants: Secondary | ICD-10-CM

## 2022-10-31 DIAGNOSIS — I2510 Atherosclerotic heart disease of native coronary artery without angina pectoris: Secondary | ICD-10-CM | POA: Diagnosis present

## 2022-10-31 DIAGNOSIS — Z79899 Other long term (current) drug therapy: Secondary | ICD-10-CM

## 2022-10-31 DIAGNOSIS — I5181 Takotsubo syndrome: Secondary | ICD-10-CM | POA: Diagnosis present

## 2022-10-31 DIAGNOSIS — R079 Chest pain, unspecified: Secondary | ICD-10-CM | POA: Diagnosis present

## 2022-10-31 DIAGNOSIS — Z8249 Family history of ischemic heart disease and other diseases of the circulatory system: Secondary | ICD-10-CM

## 2022-10-31 DIAGNOSIS — G2581 Restless legs syndrome: Secondary | ICD-10-CM | POA: Diagnosis present

## 2022-10-31 LAB — BASIC METABOLIC PANEL
Anion gap: 10 (ref 5–15)
BUN: 20 mg/dL (ref 8–23)
CO2: 28 mmol/L (ref 22–32)
Calcium: 9.5 mg/dL (ref 8.9–10.3)
Chloride: 99 mmol/L (ref 98–111)
Creatinine, Ser: 1.04 mg/dL — ABNORMAL HIGH (ref 0.44–1.00)
GFR, Estimated: 52 mL/min — ABNORMAL LOW (ref >60)
Glucose, Bld: 127 mg/dL — ABNORMAL HIGH (ref 70–99)
Potassium: 4.3 mmol/L (ref 3.5–5.1)
Sodium: 137 mmol/L (ref 135–145)

## 2022-10-31 LAB — APTT
aPTT: 32 seconds (ref 24–36)
aPTT: 73 seconds — ABNORMAL HIGH (ref 24–36)
aPTT: 85 seconds — ABNORMAL HIGH (ref 24–36)

## 2022-10-31 LAB — URINALYSIS, ROUTINE W REFLEX MICROSCOPIC
Bacteria, UA: NONE SEEN
Bilirubin Urine: NEGATIVE
Glucose, UA: NEGATIVE mg/dL
Ketones, ur: NEGATIVE mg/dL
Leukocytes,Ua: NEGATIVE
Nitrite: NEGATIVE
Protein, ur: NEGATIVE mg/dL
Specific Gravity, Urine: 1.003 — ABNORMAL LOW (ref 1.005–1.030)
pH: 7 (ref 5.0–8.0)

## 2022-10-31 LAB — CBC WITH DIFFERENTIAL/PLATELET
Abs Immature Granulocytes: 0.02 10*3/uL (ref 0.00–0.07)
Basophils Absolute: 0 10*3/uL (ref 0.0–0.1)
Basophils Relative: 0 %
Eosinophils Absolute: 0.2 10*3/uL (ref 0.0–0.5)
Eosinophils Relative: 2 %
HCT: 41.9 % (ref 36.0–46.0)
Hemoglobin: 13.6 g/dL (ref 12.0–15.0)
Immature Granulocytes: 0 %
Lymphocytes Relative: 22 %
Lymphs Abs: 2 10*3/uL (ref 0.7–4.0)
MCH: 28.9 pg (ref 26.0–34.0)
MCHC: 32.5 g/dL (ref 30.0–36.0)
MCV: 89.1 fL (ref 80.0–100.0)
Monocytes Absolute: 0.8 10*3/uL (ref 0.1–1.0)
Monocytes Relative: 9 %
Neutro Abs: 6.3 10*3/uL (ref 1.7–7.7)
Neutrophils Relative %: 67 %
Platelets: 290 10*3/uL (ref 150–400)
RBC: 4.7 MIL/uL (ref 3.87–5.11)
RDW: 15 % (ref 11.5–15.5)
WBC: 9.4 10*3/uL (ref 4.0–10.5)
nRBC: 0 % (ref 0.0–0.2)

## 2022-10-31 LAB — TROPONIN I (HIGH SENSITIVITY)
Troponin I (High Sensitivity): 11 ng/L (ref <18)
Troponin I (High Sensitivity): 31 ng/L — ABNORMAL HIGH (ref <18)
Troponin I (High Sensitivity): 39 ng/L — ABNORMAL HIGH (ref <18)

## 2022-10-31 LAB — PROTIME-INR
INR: 1.2 (ref 0.8–1.2)
Prothrombin Time: 15.5 seconds — ABNORMAL HIGH (ref 11.4–15.2)

## 2022-10-31 LAB — HEPARIN LEVEL (UNFRACTIONATED): Heparin Unfractionated: >1.1 IU/mL — ABNORMAL HIGH (ref 0.30–0.70)

## 2022-10-31 LAB — D-DIMER, QUANTITATIVE: D-Dimer, Quant: 0.67 ug/mL-FEU — ABNORMAL HIGH (ref 0.00–0.50)

## 2022-10-31 LAB — BRAIN NATRIURETIC PEPTIDE: B Natriuretic Peptide: 53.1 pg/mL (ref 0.0–100.0)

## 2022-10-31 LAB — MAGNESIUM: Magnesium: 2.4 mg/dL (ref 1.7–2.4)

## 2022-10-31 MED ORDER — NITROGLYCERIN 0.4 MG SL SUBL
0.4000 mg | SUBLINGUAL_TABLET | SUBLINGUAL | Status: DC | PRN
  Administered 2022-10-31 (×2): 0.4 mg via SUBLINGUAL
  Filled 2022-10-31 (×2): qty 1

## 2022-10-31 MED ORDER — ISOSORBIDE MONONITRATE ER 30 MG PO TB24
30.0000 mg | ORAL_TABLET | Freq: Every day | ORAL | Status: DC
  Administered 2022-10-31 – 2022-11-01 (×2): 30 mg via ORAL
  Filled 2022-10-31 (×2): qty 1

## 2022-10-31 MED ORDER — HEPARIN (PORCINE) 25000 UT/250ML-% IV SOLN
750.0000 [IU]/h | INTRAVENOUS | Status: DC
  Administered 2022-10-31 – 2022-11-03 (×3): 600 [IU]/h via INTRAVENOUS
  Administered 2022-11-05: 750 [IU]/h via INTRAVENOUS
  Filled 2022-10-31 (×4): qty 250

## 2022-10-31 MED ORDER — LISINOPRIL 20 MG PO TABS
40.0000 mg | ORAL_TABLET | Freq: Every day | ORAL | Status: DC
  Administered 2022-10-31: 40 mg via ORAL
  Filled 2022-10-31: qty 4

## 2022-10-31 MED ORDER — ONDANSETRON HCL 4 MG/2ML IJ SOLN
4.0000 mg | Freq: Four times a day (QID) | INTRAMUSCULAR | Status: DC | PRN
  Administered 2022-10-31 (×2): 4 mg via INTRAVENOUS
  Filled 2022-10-31 (×2): qty 2

## 2022-10-31 MED ORDER — KETOROLAC TROMETHAMINE 15 MG/ML IJ SOLN
15.0000 mg | Freq: Once | INTRAMUSCULAR | Status: AC
  Administered 2022-10-31: 15 mg via INTRAVENOUS
  Filled 2022-10-31: qty 1

## 2022-10-31 MED ORDER — ACETAMINOPHEN 325 MG PO TABS: 650.0000 mg | ORAL_TABLET | ORAL | Status: DC | PRN

## 2022-10-31 MED ORDER — NITROGLYCERIN 0.4 MG SL SUBL: 0.4000 mg | SUBLINGUAL_TABLET | SUBLINGUAL | Status: DC | PRN

## 2022-10-31 MED ORDER — APIXABAN 2.5 MG PO TABS: 2.5000 mg | ORAL_TABLET | Freq: Two times a day (BID) | ORAL | Status: DC

## 2022-10-31 MED ORDER — ORAL CARE MOUTH RINSE: 15.0000 mL | OROMUCOSAL | Status: DC | PRN

## 2022-10-31 MED ORDER — MORPHINE SULFATE (PF) 2 MG/ML IV SOLN
1.0000 mg | INTRAVENOUS | Status: DC | PRN
  Administered 2022-10-31: 1 mg via INTRAVENOUS
  Filled 2022-10-31: qty 1

## 2022-10-31 MED ORDER — ATORVASTATIN CALCIUM 80 MG PO TABS
80.0000 mg | ORAL_TABLET | Freq: Every day | ORAL | Status: DC
  Administered 2022-10-31 – 2022-11-07 (×7): 80 mg via ORAL
  Filled 2022-10-31: qty 4
  Filled 2022-10-31 (×2): qty 1
  Filled 2022-10-31 (×2): qty 4
  Filled 2022-10-31: qty 1
  Filled 2022-10-31: qty 4

## 2022-10-31 MED ORDER — QUETIAPINE FUMARATE 25 MG PO TABS
25.0000 mg | ORAL_TABLET | Freq: Every day | ORAL | Status: DC
  Administered 2022-10-31 – 2022-11-06 (×7): 25 mg via ORAL
  Filled 2022-10-31 (×8): qty 1

## 2022-10-31 MED ORDER — ACETAMINOPHEN 500 MG PO TABS
1000.0000 mg | ORAL_TABLET | Freq: Once | ORAL | Status: AC
  Administered 2022-10-31: 1000 mg via ORAL
  Filled 2022-10-31: qty 2

## 2022-10-31 MED ORDER — SPIRONOLACTONE 25 MG PO TABS
25.0000 mg | ORAL_TABLET | Freq: Every day | ORAL | Status: DC
  Administered 2022-10-31: 25 mg via ORAL
  Filled 2022-10-31: qty 1

## 2022-10-31 MED ORDER — DILTIAZEM HCL-DEXTROSE 125-5 MG/125ML-% IV SOLN (PREMIX)
5.0000 mg/h | INTRAVENOUS | Status: DC
  Administered 2022-10-31: 5 mg/h via INTRAVENOUS
  Filled 2022-10-31: qty 125

## 2022-10-31 MED ORDER — SERTRALINE HCL 50 MG PO TABS
50.0000 mg | ORAL_TABLET | Freq: Every day | ORAL | Status: DC
  Administered 2022-10-31 – 2022-11-07 (×7): 50 mg via ORAL
  Filled 2022-10-31 (×7): qty 1

## 2022-10-31 NOTE — Progress Notes (Signed)
ANTICOAGULATION CONSULT NOTE - Initial Consult  Pharmacy Consult for Heparin  Indication: chest pain/ACS  Allergies  Allergen Reactions   Other Rash and Swelling    "mycin" meds  Gin  Gin alcohol. Swelling of the genital area   Penicillin G Itching   Banana Swelling   Penicillins Swelling   Sulfa Antibiotics     Patient Measurements: Height: 5' 1"$  (154.9 cm) Weight: 51.8 kg (114 lb 3.2 oz) IBW/kg (Calculated) : 47.8 Heparin Dosing Weight: 51.8 kg   Vital Signs: Temp: 98.6 F (37 C) (02/17 0139) Temp Source: Oral (02/17 0139) BP: 123/94 (02/17 0330) Pulse Rate: 56 (02/17 0330)  Labs: Recent Labs    10/31/22 0149 10/31/22 0358  HGB 13.6  --   HCT 41.9  --   PLT 290  --   CREATININE 1.04*  --   TROPONINIHS 11 31*    Estimated Creatinine Clearance: 28.2 mL/min (A) (by C-G formula based on SCr of 1.04 mg/dL (H)).   Medical History: Past Medical History:  Diagnosis Date   Diastolic heart failure (HCC)    Emphysema (subcutaneous) (surgical) resulting from a procedure    Not correct   Emphysema lung (HCC)    Hypertension     Medications:  (Not in a hospital admission)   Assessment: Pharmacy consulted to dose heparin in this 89 year old female admitted with ACS/NSTEMI.  Pt was on Eliquis 2.5 mg PO BID, last dose on 2/16 @ 2200.  CrCl = 28.2 ml/min  Goal of Therapy:  Heparin level 0.3-0.7 units/ml Monitor platelets by anticoagulation protocol: Yes   Plan:  No bolus b/c of recent Eliquis dose (2/16 @ 2200) Will start heparin drip @ 600 units/hr.  Will use aPTT to guide dosing until HL and aPTT correlate. Will draw aPTT 8 hrs after start of drip. Will check HL on 2/18 with AM labs.   Shama Monfils D 10/31/2022,5:22 AM

## 2022-10-31 NOTE — Assessment & Plan Note (Addendum)
Junctional bradycardia s/p diltiazem Patient converted to junctional bradycardia in the 50s after diltiazem bolus and infusion Will hold off any rate control agents at this time.  Patient was not previously on rate control agents due to stable heart rate per last cardiology note Continue Eliquis Cardiology consult

## 2022-10-31 NOTE — H&P (Addendum)
History and Physical    Patient: Anne Mitchell [id number] DOB: 1932/04/18 DOA: 10/31/2022 DOS: the patient was seen and examined on 10/31/2022 PCP: Valera Castle, MD  Patient coming from: Home  Chief Complaint:  Chief Complaint  Patient presents with   Chest Pain    HPI: Beda Rabuck is a 89 y.o. 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  She had no associated nausea vomiting or diaphoresis and was in her usual state of health prior to onset of symptoms.  She has no cough fever or chills and denies leg pain or swelling.  On arrival of EMS heart rate was in the 140s and she was in A-fib.  She was administered aspirin and route. ED course and data review: Patient in rapid A-fib in the 150s and tachypneic to 40.  Labs for the most part unremarkable with first troponin 11, BNP 53.  D-dimer 0.67. Chest x-ray was nonacute. Patient was treated with a diltiazem bolus and started on an infusion and subsequently converted to a junctional rhythm at 57 at which point diltiazem infusion was discontinued. She continued to complain of chest pain at which time sublingual nitroglycerin was administered.  Hospitalist consulted for chest pain rule out on observation for rate control.   Review of Systems: As mentioned in the history of present illness. All other systems reviewed and are negative.  Past Medical History:  Diagnosis Date   Diastolic heart failure (HCC)    Emphysema (subcutaneous) (surgical) resulting from a procedure    Not correct   Emphysema lung ([place])    Hypertension    Past Surgical History:  Procedure Laterality Date   LEFT HEART CATH AND CORONARY ANGIOGRAPHY N/A 03/22/2020    Procedure: LEFT HEART CATH AND CORONARY ANGIOGRAPHY;  Surgeon: Corey Skains, MD;  Location: [facility];  Service: Cardiovascular;  Laterality: N/A;   Social History:  reports that she has never smoked. She has never used smokeless tobacco. She reports that she does not drink alcohol and does not use drugs.  Allergies  Allergen Reactions   Other Rash and Swelling    "mycin" meds  Gin  Gin alcohol. Swelling of the genital area   Penicillin G Itching   Banana Swelling   Penicillins Swelling   Sulfa Antibiotics     Family History  Problem Relation Age of Onset   Colon cancer Mother    Hypertension Mother     Prior to Admission medications   Medication Sig Start Date End Date Taking? Authorizing Provider  albuterol (VENTOLIN HFA) 108 (90 Base) MCG/ACT inhaler Inhale 2 puffs into the lungs every 6 (six) hours as needed for wheezing or shortness of breath. 09/16/22  Yes Lavonia Drafts, MD  atorvastatin (LIPITOR) 80 MG tablet Take 1 tablet (80 mg total) by mouth daily. 03/23/20  Yes Wieting, Richard, MD  Calcium Carb-Cholecalciferol (CALCIUM 500 + D PO) Take 1 tablet by mouth as directed. Take 1 tablet three times a week on Sunday, Wednesday and Friday mornings.   Yes [provider]  Cholecalciferol 50 MCG (2000 UT) CAPS Take 2,000 Units by mouth daily.   Yes [provider]  diphenhydrAMINE (BENADRYL) 25 mg capsule Take 25 mg by mouth daily.  Yes [provider]  ELIQUIS 2.5 MG TABS tablet Take 2.5 mg by mouth 2 (two) times daily. 01/15/20  Yes [provider]  furosemide (LASIX) 40 MG tablet Take 40 mg by mouth daily. 02/20/20  Yes [provider]  isosorbide mononitrate (IMDUR) 30 MG 24 hr tablet Take 30 mg by mouth daily. 09/15/22  Yes [provider]  lisinopril (ZESTRIL) 40 MG tablet Take 40 mg by mouth daily. 02/20/20  Yes [provider]  Multiple Vitamins-Minerals (VITRUM SENIOR) TABS Take by mouth.   Yes  [provider]  sertraline (ZOLOFT) 50 MG tablet Take 50 mg by mouth daily. 01/12/20  Yes [provider]  spironolactone (ALDACTONE) 25 MG tablet Take 1 tablet (25 mg total) by mouth daily. 03/23/20  Yes Wieting, Richard, MD  aspirin EC 81 MG EC tablet Take 1 tablet (81 mg total) by mouth daily. Swallow whole. Patient not taking: Reported on 10/31/2022 03/23/20   Loletha Grayer, MD  metoprolol tartrate (LOPRESSOR) 25 MG tablet Take 0.5 tablets (12.5 mg total) by mouth 2 (two) times daily. Patient not taking: Reported on 10/31/2022 03/23/20   Loletha Grayer, MD    Physical Exam: Vitals:   10/31/22 0245 10/31/22 0300 10/31/22 0315 10/31/22 0330  BP: (!) 132/100 132/64 92/70 (!) 123/94  Pulse: (!) 139 (!) 117 (!) 57 (!) 56  Resp: (!) 32 (!) 37 (!) 28 (!) 22  Temp:      TempSrc:      SpO2: 93% 92% 94% 93%  Weight:      Height:       Physical Exam Vitals and nursing note reviewed.  Constitutional:      General: She is not in acute distress. HENT:     Head: Normocephalic and atraumatic.  Cardiovascular:     Rate and Rhythm: Normal rate and regular rhythm.     Heart sounds: Normal heart sounds.  Pulmonary:     Effort: Pulmonary effort is normal.     Breath sounds: Normal breath sounds.  Abdominal:     Palpations: Abdomen is soft.     Tenderness: There is no abdominal tenderness.  Neurological:     Mental Status: Mental status is at baseline.     Labs on Admission: I have personally reviewed following labs and imaging studies  CBC: Recent Labs  Lab 10/31/22 0149  WBC 9.4  NEUTROABS 6.3  HGB 13.6  HCT 41.9  MCV 89.1  PLT [id number]   Basic Metabolic Panel: Recent Labs  Lab 10/31/22 0149  NA 137  K 4.3  CL 99  CO2 28  GLUCOSE 127*  BUN 20  CREATININE 1.04*  CALCIUM 9.5  MG 2.4   GFR: Estimated Creatinine Clearance: 28.2 mL/min (A) (by C-G formula based on SCr of 1.04 mg/dL (H)). Liver Function Tests: No results for input(s): "AST", "ALT",  "ALKPHOS", "BILITOT", "PROT", "ALBUMIN" in the last 168 hours. No results for input(s): "LIPASE", "AMYLASE" in the last 168 hours. No results for input(s): "AMMONIA" in the last 168 hours. Coagulation Profile: No results for input(s): "INR", "PROTIME" in the last 168 hours. Cardiac Enzymes: No results for input(s): "CKTOTAL", "CKMB", "CKMBINDEX", "TROPONINI" in the last 168 hours. BNP (last 3 results) No results for input(s): "PROBNP" in the last 8760 hours. HbA1C: No results for input(s): "HGBA1C" in the last 72 hours. CBG: No results for input(s): "GLUCAP" in the last 168 hours. Lipid Profile: No results for input(s): "CHOL", "HDL", "LDLCALC", "TRIG", "CHOLHDL", "LDLDIRECT" in the last 72 hours.  Thyroid Function Tests: No results for input(s): "TSH", "T4TOTAL", "FREET4", "T3FREE", "THYROIDAB" in the last 72 hours. Anemia Panel: No results for input(s): "VITAMINB12", "FOLATE", "FERRITIN", "TIBC", "IRON", "RETICCTPCT" in the last 72 hours. Urine analysis:    Component Value Date/Time   COLORURINE STRAW (A) 10/31/2022 0237   APPEARANCEUR CLEAR (A) 10/31/2022 0237   LABSPEC 1.003 (L) 10/31/2022 0237   PHURINE 7.0 10/31/2022 0237   GLUCOSEU NEGATIVE 10/31/2022 0237   HGBUR SMALL (A) 10/31/2022 0237   BILIRUBINUR NEGATIVE 10/31/2022 0237   KETONESUR NEGATIVE 10/31/2022 0237   PROTEINUR NEGATIVE 10/31/2022 0237   NITRITE NEGATIVE 10/31/2022 0237   LEUKOCYTESUR NEGATIVE 10/31/2022 0237    Radiological Exams on Admission: DG Shoulder Right  Result Date: 10/31/2022 CLINICAL DATA:  Right shoulder pain. EXAM: RIGHT SHOULDER - 2+ VIEW COMPARISON:  Chest radiograph dated 10/31/2022. FINDINGS: No acute fracture or dislocation the bones are osteopenic. Mild degenerative changes of the right shoulder. Rounded osseous density over the humeral neck may represent loose bodies. The soft tissues are unremarkable. IMPRESSION: 1. No acute fracture or dislocation. 2. Mild degenerative changes of the  right shoulder. Electronically Signed   By: Anner Crete M.D.   On: 10/31/2022 02:22   DG Chest 2 View  Result Date: 10/31/2022 CLINICAL DATA:  Chest pain. EXAM: CHEST - 2 VIEW COMPARISON:  Chest radiograph dated 09/16/2022. FINDINGS: There is diffuse chronic interstitial coarsening. Mild elevation of the right hemidiaphragm. No focal consolidation, pleural effusion or pneumothorax. The cardiac silhouette is within limits. Osteopenia with degenerative changes of the spine. No acute osseous pathology. IMPRESSION: 1. No acute cardiopulmonary process. 2. Chronic interstitial coarsening. Electronically Signed   By: Anner Crete M.D.   On: 10/31/2022 02:21     Data Reviewed: Relevant notes from primary care and specialist visits, past discharge summaries as available in EHR, including Care Everywhere. Prior diagnostic testing as pertinent to current admission diagnoses Updated medications and problem lists for reconciliation ED course, including vitals, labs, imaging, treatment and response to treatment Triage notes, nursing and pharmacy notes and ED provider's notes Notable results as noted in HPI   Assessment and Plan: Unstable angina (HCC) CAD, history of NSTEMI 2021 History of stress cardiomyopathy 2021 Patient with history of multiple vessel but nonobstructive CAD on cath 2021 First troponin 11.  Continue to trend---> second troponin 33,  and patient having persistent chest pain so heparin started Received full dose aspirin Nitroglycerin sublingual as needed chest pain Will keep n.p.o. in case of procedure Cardiology consult  Rapid atrial fibrillation ([place]) Junctional bradycardia s/p diltiazem Patient converted to junctional bradycardia in the 50s after diltiazem bolus and infusion Will hold off any rate control agents at this time.  Patient was not previously on rate control agents due to stable heart rate per last cardiology note Continue Eliquis Cardiology  consult     DVT prophylaxis: Lovenox  Consults: Cardiology, Dr. Clayborn Bigness  Advance Care Planning:   Code Status: Prior   Family Communication: Daughter Heidi Hinker at bedside  Disposition Plan: Back to previous home environment  Severity of Illness: The appropriate patient status for this patient is OBSERVATION. Observation status is judged to be reasonable and necessary in order to provide the required intensity of service to ensure the patient's safety. The patient's presenting symptoms, physical exam findings, and initial radiographic and laboratory data in the context of their medical condition is felt to place them at decreased risk for further clinical deterioration. Furthermore, it is anticipated that the patient will be  medically stable for discharge from the hospital within 2 midnights of admission.   Author: Athena Masse, MD 10/31/2022 4:03 AM  For on call review [email or web address].

## 2022-10-31 NOTE — ED Notes (Signed)
Pt complaining of nausea, given zofran via iv.

## 2022-10-31 NOTE — ED Notes (Signed)
pt's continues to have nausea. Pt has been npo, and may need to eat something. Sent message to MD Billie Ruddy requesting the pt ability to eat at this time.

## 2022-10-31 NOTE — Assessment & Plan Note (Deleted)
CAD, history of NSTEMI 2021 History of stress cardiomyopathy 2021 Patient with history of multiple vessel but nonobstructive CAD on cath 2021 First troponin 11.  Continue to trend---> second troponin 33,  and patient having persistent chest pain so heparin started Chest pain possibly related to rapid A-fib Received full dose aspirin Nitroglycerin sublingual as needed chest pain Will keep n.p.o. in case of procedure Cardiology consult

## 2022-10-31 NOTE — Progress Notes (Signed)
ANTICOAGULATION CONSULT NOTE -   Pharmacy Consult for Heparin  Indication: chest pain/ACS  Allergies  Allergen Reactions   Other Rash and Swelling    "mycin" meds  Gin  Gin alcohol. Swelling of the genital area   Penicillin G Itching   Banana Swelling   Penicillins Swelling   Sulfa Antibiotics     Patient Measurements: Height: 5' 1"$  (154.9 cm) Weight: 51.8 kg (114 lb 3.2 oz) IBW/kg (Calculated) : 47.8 Heparin Dosing Weight: 51.8 kg   Vital Signs: Temp: 98.1 F (36.7 C) (02/17 1409) Temp Source: Oral (02/17 1409) BP: 102/46 (02/17 1345) Pulse Rate: 56 (02/17 1345)  Labs: Recent Labs    10/31/22 0149 10/31/22 0358 10/31/22 0524 10/31/22 1341  HGB 13.6  --   --   --   HCT 41.9  --   --   --   PLT 290  --   --   --   APTT  --   --  32 73*  LABPROT  --   --  15.5*  --   INR  --   --  1.2  --   HEPARINUNFRC  --   --  >1.10*  --   CREATININE 1.04*  --   --   --   TROPONINIHS 11 31*  --   --      Estimated Creatinine Clearance: 28.2 mL/min (A) (by C-G formula based on SCr of 1.04 mg/dL (H)).   Medical History: Past Medical History:  Diagnosis Date   Diastolic heart failure (HCC)    Emphysema (subcutaneous) (surgical) resulting from a procedure    Not correct   Emphysema lung (HCC)    Hypertension     Medications:  (Not in a hospital admission)  Assessment: Pharmacy consulted to dose heparin in this 89 year old female admitted with ACS/NSTEMI.  Pt was on Eliquis 2.5 mg PO BID, last dose on 2/16 @ 2200.  CrCl = 28.2 ml/min  2/17 1341 aPTT=73 Therapeutic x1   Goal of Therapy:  aPTT 66-102 Heparin level 0.3-0.7 units/ml Monitor platelets by anticoagulation protocol: Yes   Plan:  2/17 1341 aPTT=73 Therapeutic x1 Will continue heparin drip @ 600 units/hr.  Will use aPTT to guide dosing until HL and aPTT correlate. Will check confirmatory aPTT in 8 hrs Will check HL on 2/18 with AM labs.   Kami Kube A 10/31/2022,2:41 PM

## 2022-10-31 NOTE — Progress Notes (Signed)
ANTICOAGULATION CONSULT NOTE -   Pharmacy Consult for Heparin  Indication: chest pain/ACS  Allergies  Allergen Reactions   Other Rash and Swelling    "mycin" meds  Gin  Gin alcohol. Swelling of the genital area   Penicillin G Itching   Banana Swelling   Penicillins Swelling   Sulfa Antibiotics     Patient Measurements: Height: 5' 1"$  (154.9 cm) Weight: 51.8 kg (114 lb 3.2 oz) IBW/kg (Calculated) : 47.8 Heparin Dosing Weight: 51.8 kg   Vital Signs: Temp: 97.5 F (36.4 C) (02/17 1953) Temp Source: Oral (02/17 1409) BP: 103/42 (02/17 1953) Pulse Rate: 50 (02/17 1953)  Labs: Recent Labs    10/31/22 0149 10/31/22 0358 10/31/22 0524 10/31/22 1341 10/31/22 2100  HGB 13.6  --   --   --   --   HCT 41.9  --   --   --   --   PLT 290  --   --   --   --   APTT  --   --  32 73* 85*  LABPROT  --   --  15.5*  --   --   INR  --   --  1.2  --   --   HEPARINUNFRC  --   --  >1.10*  --   --   CREATININE 1.04*  --   --   --   --   TROPONINIHS 11 31*  --   --   --      Estimated Creatinine Clearance: 28.2 mL/min (A) (by C-G formula based on SCr of 1.04 mg/dL (H)).   Medical History: Past Medical History:  Diagnosis Date   Diastolic heart failure (HCC)    Emphysema (subcutaneous) (surgical) resulting from a procedure    Not correct   Emphysema lung ([place])    Hypertension     Medications:  Medications Prior to Admission  Medication Sig Dispense Refill Last Dose   albuterol (VENTOLIN HFA) 108 (90 Base) MCG/ACT inhaler Inhale 2 puffs into the lungs every 6 (six) hours as needed for wheezing or shortness of breath. 8 g 2 prn at prn   atorvastatin (LIPITOR) 80 MG tablet Take 1 tablet (80 mg total) by mouth daily. 30 tablet 0 10/30/2022 at 1000   Calcium Carb-Cholecalciferol (CALCIUM 500 + D PO) Take 1 tablet by mouth as directed. Take 1 tablet three times a week on Sunday, Wednesday and Friday mornings.   10/30/2022 at 1000   Cholecalciferol 50 MCG (2000 UT) CAPS Take 2,000  Units by mouth daily.   10/30/2022 at 1000   diphenhydrAMINE (BENADRYL) 25 mg capsule Take 25 mg by mouth daily.      ELIQUIS 2.5 MG TABS tablet Take 2.5 mg by mouth 2 (two) times daily.   10/30/2022 at 2200   furosemide (LASIX) 40 MG tablet Take 40 mg by mouth daily.   10/30/2022 at 1000   isosorbide mononitrate (IMDUR) 30 MG 24 hr tablet Take 30 mg by mouth daily.   10/30/2022 at 1000   lisinopril (ZESTRIL) 40 MG tablet Take 40 mg by mouth daily.   10/30/2022 at 1000   Multiple Vitamins-Minerals (VITRUM SENIOR) TABS Take by mouth.   10/30/2022 at 1000   sertraline (ZOLOFT) 50 MG tablet Take 50 mg by mouth daily.   10/30/2022 at 1000   spironolactone (ALDACTONE) 25 MG tablet Take 1 tablet (25 mg total) by mouth daily. 30 tablet 0 10/30/2022 at 1000   aspirin EC 81 MG EC tablet Take  1 tablet (81 mg total) by mouth daily. Swallow whole. (Patient not taking: Reported on 10/31/2022) 30 tablet 0 Not Taking   metoprolol tartrate (LOPRESSOR) 25 MG tablet Take 0.5 tablets (12.5 mg total) by mouth 2 (two) times daily. (Patient not taking: Reported on 10/31/2022) 30 tablet 0 Not Taking    Assessment: Pharmacy consulted to dose heparin in this 89 year old female admitted with ACS/NSTEMI.  Pt was on Eliquis 2.5 mg PO BID, last dose on 2/16 @ 2200.  CrCl = 28.2 ml/min  2/17 1341 aPTT=73 Therapeutic x1 2/17 2100 aPTT=85 Therapeutic x 2  Goal of Therapy:  aPTT 66-102 Heparin level 0.3-0.7 units/ml Monitor platelets by anticoagulation protocol: Yes   Plan:  Will continue heparin drip @ 600 units/hr.  Will use aPTT to guide dosing until HL and aPTT correlate. Check aPTT and HL with AM labs Daily CBC while on heparin  Lorin Picket, PharmD 10/31/2022,9:43 PM

## 2022-10-31 NOTE — ED Triage Notes (Signed)
Pt from home to [facility] via [place] with c/o chest pain x2hrs. Pt was resting at home playing solitaire when the pain began. Pt has a hx of MI x59yr and a hx afib. Pt also has hx of breast cancer.

## 2022-10-31 NOTE — ED Notes (Signed)
Pt's diet order changed by MD Billie Ruddy. Called down to dietary to ensure the pt will get a breakfast tray.

## 2022-10-31 NOTE — Assessment & Plan Note (Addendum)
CAD, history of NSTEMI 2021 History of stress cardiomyopathy 2021 Patient with history of multiple vessel but nonobstructive CAD on cath 2021 First troponin 11.  Continue to trend---> second troponin 33,  and patient having persistent chest pain so heparin started Received full dose aspirin Nitroglycerin sublingual as needed chest pain Will keep n.p.o. in case of procedure Cardiology consult

## 2022-10-31 NOTE — ED Notes (Signed)
Pt given a breakfast tray.

## 2022-10-31 NOTE — Consult Note (Signed)
CARDIOLOGY CONSULT NOTE               Patient ID: Anne Mitchell MRN: [id number] DOB/AGE: 87-Jul-1933 87 y.o.  Admit date: 10/31/2022 Referring Physician Dr. Judd Gaudier hospitalist Primary Physician Dr. Guy Begin [name] primary Primary Cardiologist Nehemiah Massed Reason for Consultation chest pain possible angina  HPI: 89 year old female presents with a history of nonobstructive coronary disease stress-induced cardiomyopathy from the cath in 2021 has history of hypertension rate controlled atrial fibrillation on Eliquis who states that she just was not feeling good complains of some shoulder pain back and chest discomfort pain is mostly in the right shoulder patient reportedly had rapid atrial fibrillation by EMS she was tachypneic at the time D-dimer has been elevated but other laboratories have been basically unremarkable including EKG patient was given diltiazem as rate control agent converted to junctional subsequently sinus rhythm she is having recurrent chest pain symptoms responded to nitroglycerin complex for the syncope no nausea vomiting or diarrhea  Review of systems complete and found to be negative unless listed above     Past Medical History:  Diagnosis Date   Diastolic heart failure ([place])    Emphysema (subcutaneous) (surgical) resulting from a procedure    Not correct   Emphysema lung ([place])    Hypertension     Past Surgical History:  Procedure Laterality Date   LEFT HEART CATH AND CORONARY ANGIOGRAPHY N/A 03/22/2020   Procedure: LEFT HEART CATH AND CORONARY ANGIOGRAPHY;  Surgeon: Corey Skains, MD;  Location: [facility];  Service: Cardiovascular;  Laterality: N/A;    (Not in a hospital admission)  Social History   Socioeconomic History   Marital status: Unknown    Spouse name: Not on file   Number of children: Not on file   Years of education: Not on file   Highest education level: Not on file  Occupational History   Not on file  Tobacco Use    Smoking status: Never   Smokeless tobacco: Never  Substance and Sexual Activity   Alcohol use: Never   Drug use: Never   Sexual activity: Not on file  Other Topics Concern   Not on file  Social History Narrative   Not on file   Social Determinants of Health   Financial Resource Strain: Not on file  Food Insecurity: Not on file  Transportation Needs: Not on file  Physical Activity: Not on file  Stress: Not on file  Social Connections: Not on file  Intimate Partner Violence: Not on file    Family History  Problem Relation Age of Onset   Colon cancer Mother    Hypertension Mother       Review of systems complete and found to be negative unless listed above      PHYSICAL EXAM  General: Well developed, well nourished, in no acute distress HEENT:  Normocephalic and atramatic Neck:  No JVD.  Lungs: Clear bilaterally to auscultation and percussion. Heart: HRRR . Normal S1 and S2 without gallops or murmurs.  Abdomen: Bowel sounds are positive, abdomen soft and non-tender  Msk:  Back normal, normal gait. Normal strength and tone for age. Extremities: No clubbing, cyanosis or edema.   Neuro: Alert and oriented X 3. Psych:  Good affect, responds appropriately  Labs:   Lab Results  Component Value Date   WBC 9.4 10/31/2022   HGB 13.6 10/31/2022   HCT 41.9 10/31/2022   MCV 89.1 10/31/2022   PLT 290 10/31/2022    Recent Labs  Lab 10/31/22 0149  NA 137  K 4.3  CL 99  CO2 28  BUN 20  CREATININE 1.04*  CALCIUM 9.5  GLUCOSE 127*   No results found for: "CKTOTAL", "CKMB", "CKMBINDEX", "TROPONINI"  Lab Results  Component Value Date   CHOL 157 03/21/2020   Lab Results  Component Value Date   HDL 75 03/21/2020   Lab Results  Component Value Date   LDLCALC 74 03/21/2020   Lab Results  Component Value Date   TRIG 39 03/21/2020   Lab Results  Component Value Date   CHOLHDL 2.1 03/21/2020   No results found for: "LDLDIRECT"    Radiology: DG Shoulder  Right  Result Date: 10/31/2022 CLINICAL DATA:  Right shoulder pain. EXAM: RIGHT SHOULDER - 2+ VIEW COMPARISON:  Chest radiograph dated 10/31/2022. FINDINGS: No acute fracture or dislocation the bones are osteopenic. Mild degenerative changes of the right shoulder. Rounded osseous density over the humeral neck may represent loose bodies. The soft tissues are unremarkable. IMPRESSION: 1. No acute fracture or dislocation. 2. Mild degenerative changes of the right shoulder. Electronically Signed   By: Anner Crete M.D.   On: 10/31/2022 02:22   DG Chest 2 View  Result Date: 10/31/2022 CLINICAL DATA:  Chest pain. EXAM: CHEST - 2 VIEW COMPARISON:  Chest radiograph dated 09/16/2022. FINDINGS: There is diffuse chronic interstitial coarsening. Mild elevation of the right hemidiaphragm. No focal consolidation, pleural effusion or pneumothorax. The cardiac silhouette is within limits. Osteopenia with degenerative changes of the spine. No acute osseous pathology. IMPRESSION: 1. No acute cardiopulmonary process. 2. Chronic interstitial coarsening. Electronically Signed   By: Anner Crete M.D.   On: 10/31/2022 02:21    EKG: Normal sinus fax a copy of my provide block nonspecific ST-T wave changes rate of 70  ASSESSMENT AND PLAN:  Chest pain Possible angina History of nonobstructive coronary disease Stress-induced cardiomyopathy Hypertension Atrial fibrillation Borderline obesity Emphysema Diastolic heart failure . Plan Agree with admit to telemetry rule out myocardial infarction follow-up EKGs and troponins Echocardiogram for assessment left ventricular function Anticoagulation with heparin in place of Eliquis Continue hypertension management and control Inhalers as necessary for COPD emphysema Consider functional study versus cardiac cath for evaluation of ischemia  Signed: Yolonda Kida MD, 10/31/2022, 12:01 PM

## 2022-10-31 NOTE — ED Provider Notes (Addendum)
[facility] Provider Note    Event Date/Time   First MD Initiated Contact with Patient 10/31/22 0134     (approximate)   History   Chest Pain   HPI  Anne Mitchell is a 89 y.o. female with history of hypertension, hyperlipidemia, A-fib on Eliquis, emphysema, heart failure, CAD who presents to the emergency department with throbbing central to right chest and shoulder pain that started tonight.  No fevers, cough, shortness of breath, nausea, vomiting, diaphoresis, dizziness.  Last took Eliquis at 10 PM.  Feeling well prior to this.  Symptoms ongoing for 2 hours and started at rest.  EMS gave 324 mg of aspirin in route.  EMS report heart rate fluctuating between the 80s and 140s in A-fib.   History provided by patient, EMS.    Past Medical History:  Diagnosis Date   Diastolic heart failure (HCC)    Emphysema (subcutaneous) (surgical) resulting from a procedure    Not correct   Emphysema lung ([place])    Hypertension     Past Surgical History:  Procedure Laterality Date   LEFT HEART CATH AND CORONARY ANGIOGRAPHY N/A 03/22/2020   Procedure: LEFT HEART CATH AND CORONARY ANGIOGRAPHY;  Surgeon: Corey Skains, MD;  Location: [facility];  Service: Cardiovascular;  Laterality: N/A;    MEDICATIONS:  Prior to Admission medications   Medication Sig Start Date End Date Taking? Authorizing Provider  albuterol (VENTOLIN HFA) 108 (90 Base) MCG/ACT inhaler Inhale 2 puffs into the lungs every 6 (six) hours as needed for wheezing or shortness of breath. 09/16/22   Lavonia Drafts, MD  aspirin EC 81 MG EC tablet Take 1 tablet (81 mg total) by mouth daily. Swallow whole. 03/23/20   Loletha Grayer, MD  atorvastatin (LIPITOR) 80 MG tablet Take 1 tablet (80 mg total) by mouth daily. 03/23/20   Loletha Grayer, MD  Cholecalciferol 50 MCG (2000 UT) CAPS Take 2,000 Units by mouth daily.    [provider]  ELIQUIS 2.5 MG TABS tablet Take 2.5 mg by mouth 2  (two) times daily. 01/15/20   [provider]  furosemide (LASIX) 40 MG tablet Take 40 mg by mouth daily. 02/20/20   [provider]  lisinopril (ZESTRIL) 40 MG tablet Take 40 mg by mouth daily. 02/20/20   [provider]  metoprolol tartrate (LOPRESSOR) 25 MG tablet Take 0.5 tablets (12.5 mg total) by mouth 2 (two) times daily. 03/23/20   Loletha Grayer, MD  Multiple Vitamins-Minerals (VITRUM SENIOR) TABS Take by mouth.    [provider]  sertraline (ZOLOFT) 50 MG tablet Take 50 mg by mouth daily. 01/12/20   [provider]  spironolactone (ALDACTONE) 25 MG tablet Take 1 tablet (25 mg total) by mouth daily. 03/23/20   Loletha Grayer, MD    Physical Exam   Triage Vital Signs: ED Triage Vitals  Enc Vitals Group     BP 10/31/22 0139 (!) 159/87     Pulse Rate 10/31/22 0139 (!) 150     Resp 10/31/22 0139 (!) 40     Temp 10/31/22 0139 98.6 F (37 C)     Temp Source 10/31/22 0139 Oral     SpO2 10/31/22 0139 98 %     Weight 10/31/22 0142 114 lb 3.2 oz (51.8 kg)     Height 10/31/22 0142 5' 1"$  (1.549 m)     Head Circumference --      Peak Flow --      Pain Score 10/31/22  0141 7     Pain Loc --      Pain Edu? --      Excl. in [place]? --      Most recent vital signs: Vitals:   10/31/22 0315 10/31/22 0330  BP: 92/70 (!) 123/94  Pulse: (!) 57 (!) 56  Resp: (!) 28 (!) 22  Temp:    SpO2: 94% 93%    CONSTITUTIONAL: Alert, responds appropriately to questions.  Elderly HEAD: Normocephalic, atraumatic EYES: Conjunctivae clear, pupils appear equal, sclera nonicteric ENT: normal nose; moist mucous membranes NECK: Supple, normal ROM CARD: Irregular and tachycardic; S1 and S2 appreciated RESP: Normal chest excursion without splinting or tachypnea; breath sounds clear and equal bilaterally; no wheezes, no rhonchi, no rales, no hypoxia or respiratory distress, speaking full sentences ABD/GI: Non-distended; soft, non-tender, no rebound, no guarding, no  peritoneal signs BACK: The back appears normal EXT: Normal ROM in all joints; no deformity noted, no edema, no calf tenderness or calf swelling, patient has increased pain with movement of the right shoulder but no deformity of this joint SKIN: Normal color for age and race; warm; no rash on exposed skin NEURO: Moves all extremities equally, normal speech PSYCH: The patient's mood and manner are appropriate.   ED Results / Procedures / Treatments   LABS: (all labs ordered are listed, but only abnormal results are displayed) Labs Reviewed  BASIC METABOLIC PANEL - Abnormal; Notable for the following components:      Result Value   Glucose, Bld 127 (*)    Creatinine, Ser 1.04 (*)    GFR, Estimated 52 (*)    All other components within normal limits  D-DIMER, QUANTITATIVE - Abnormal; Notable for the following components:   D-Dimer, Quant 0.67 (*)    All other components within normal limits  URINALYSIS, ROUTINE W REFLEX MICROSCOPIC - Abnormal; Notable for the following components:   Color, Urine STRAW (*)    APPearance CLEAR (*)    Specific Gravity, Urine 1.003 (*)    Hgb urine dipstick SMALL (*)    All other components within normal limits  CBC WITH DIFFERENTIAL/PLATELET  BRAIN NATRIURETIC PEPTIDE  MAGNESIUM  TROPONIN I (HIGH SENSITIVITY)  TROPONIN I (HIGH SENSITIVITY)     EKG:  EKG Interpretation  Date/Time:  Saturday October 31 2022 01:37:11 EST Ventricular Rate:  163 PR Interval:    QRS Duration: 119 QT Interval:  285 QTC Calculation: 470 R Axis:   137 Text Interpretation: Atrial fibrillation with rapid V-rate Nonspecific intraventricular conduction delay Probable anteroseptal infarct, recent Baseline wander in lead(s) V1 Confirmed by Pryor Curia [phone]) on 10/31/2022 1:39:49 AM         EKG Interpretation  Date/Time:  Saturday October 31 2022 03:16:47 EST Ventricular Rate:  57 PR Interval:    QRS Duration: 118 QT Interval:  442 QTC Calculation: 431 R  Axis:   128 Text Interpretation: Junctional rhythm Nonspecific intraventricular conduction delay Anteroseptal infarct, old Confirmed by Pryor Curia [phone]) on 10/31/2022 3:27:13 AM         RADIOLOGY: My personal review and interpretation of imaging: X-rays show no acute abnormality.  I have personally reviewed all radiology reports.   DG Shoulder Right  Result Date: 10/31/2022 CLINICAL DATA:  Right shoulder pain. EXAM: RIGHT SHOULDER - 2+ VIEW COMPARISON:  Chest radiograph dated 10/31/2022. FINDINGS: No acute fracture or dislocation the bones are osteopenic. Mild degenerative changes of the right shoulder. Rounded osseous density over the humeral neck may represent loose bodies. The soft tissues  are unremarkable. IMPRESSION: 1. No acute fracture or dislocation. 2. Mild degenerative changes of the right shoulder. Electronically Signed   By: Anner Crete M.D.   On: 10/31/2022 02:22   DG Chest 2 View  Result Date: 10/31/2022 CLINICAL DATA:  Chest pain. EXAM: CHEST - 2 VIEW COMPARISON:  Chest radiograph dated 09/16/2022. FINDINGS: There is diffuse chronic interstitial coarsening. Mild elevation of the right hemidiaphragm. No focal consolidation, pleural effusion or pneumothorax. The cardiac silhouette is within limits. Osteopenia with degenerative changes of the spine. No acute osseous pathology. IMPRESSION: 1. No acute cardiopulmonary process. 2. Chronic interstitial coarsening. Electronically Signed   By: Anner Crete M.D.   On: 10/31/2022 02:21     PROCEDURES:  Critical Care performed: Yes, see critical care procedure note(s)   CRITICAL CARE Performed by: Cyril Mourning Keri Tavella   Total critical care time: 40 minutes  Critical care time was exclusive of separately billable procedures and treating other patients.  Critical care was necessary to treat or prevent imminent or life-threatening deterioration.  Critical care was time spent personally by me on the following activities:  development of treatment plan with patient and/or surrogate as well as nursing, discussions with consultants, evaluation of patient's response to treatment, examination of patient, obtaining history from patient or surrogate, ordering and performing treatments and interventions, ordering and review of laboratory studies, ordering and review of radiographic studies, pulse oximetry and re-evaluation of patient's condition.   Marland Kitchen1-3 Lead EKG Interpretation  Performed by: Carlester Kasparek, Delice Bison, DO Authorized by: Kortni Hasten, Delice Bison, DO     Interpretation: abnormal     ECG rate:  163   ECG rate assessment: tachycardic     Rhythm: atrial fibrillation     Ectopy: none     Conduction: normal       IMPRESSION / MDM / ASSESSMENT AND PLAN / ED COURSE  I reviewed the triage vital signs and the nursing notes.    Patient here with right-sided chest pain.  The patient is on the cardiac monitor to evaluate for evidence of arrhythmia and/or significant heart rate changes.   DIFFERENTIAL DIAGNOSIS (includes but not limited to):   ACS, PE, pneumonia, pneumothorax, CHF, A-fib with RVR, musculoskeletal pain   Patient's presentation is most consistent with acute presentation with potential threat to life or bodily function.   PLAN: Will obtain CBC, BMP, troponin x 2, BNP given her history of CHF with complaints of chest pain, chest x-ray, right shoulder x-ray.  Will give Tylenol for pain.  Will start diltiazem for A-fib with RVR.  Anticipate admission.   MEDICATIONS GIVEN IN ED: Medications  nitroGLYCERIN (NITROSTAT) SL tablet 0.4 mg (has no administration in time range)  acetaminophen (TYLENOL) tablet 1,000 mg (1,000 mg Oral Given 10/31/22 0229)     ED COURSE: Patient's hemoglobin is normal.  No leukocytosis.  Normal electrolytes.  First troponin negative.  BNP is normal at 53.  Age-adjusted D-dimer negative.  Urine shows no sign of infection.  Chest x-ray and right shoulder x-ray showed no acute  abnormalities.  Patient on diltiazem infusion and converted into a sinus bradycardia versus junctional rhythm without ischemic changes but still complaining of chest pain.  Will give nitroglycerin.  Will discuss with hospitalist for admission for observation.   3:50 AM  D/w plus who recommends discontinuing her IV diltiazem at this time.  Based on her previous cardiology note in April 2023 they did not have her on a calcium channel blocker or beta-blocker due to not needing it  for A-fib, rate controlled.  She also had documented history of sick sinus syndrome.  Hospitalist will determine what oral medications to place her on to prevent A-fib with RVR from reoccurring.  Currently heart rate is 56.   CONSULTS:  Consulted and discussed patient's case with hospitalist, Dr. Damita Dunnings.  I have recommended admission and consulting physician agrees and will place admission orders.  Patient (and family if present) agree with this plan.   I reviewed all nursing notes, vitals, pertinent previous records.  All labs, EKGs, imaging ordered have been independently reviewed and interpreted by myself.    OUTSIDE RECORDS REVIEWED: Reviewed last PCP note in January 2024 last cardiology note in April 2023.  Cath 03/22/20:  Prox RCA lesion is 60% stenosed. Mid RCA lesion is 30% stenosed. Ost Cx to Prox Cx lesion is 75% stenosed. Prox Cx lesion is 65% stenosed. Prox LAD lesion is 30% stenosed. Mid LAD lesion is 20% stenosed.   89 year old female with known coronary atherosclerosis hypertension hyperlipidemia paroxysmal nonvalvular atrial fibrillation having acute systolic dysfunction congestive heart failure elevated troponin with apical ballooning by echocardiogram consistent with stress-induced cardiomyopathy rather than acute coronary syndrome   Apical ballooning with ejection fraction of 25 to 30%   75% stenosis of ostial left circumflex artery 60% stenosis of right coronary artery Mild atherosclerosis of left  anterior descending artery   Assessment Apical ballooning and/or stress-induced cardiomyopathy with minimal elevation of troponin more consistent with heart failure rather than acute coronary syndrome   Plan Medical management including Lasix beta-blocker spironolactone and ACE inhibitor for cardiomyopathy and congestive heart failure No further cardiac intervention at this time Risk factor modification with high intensity cholesterol therapy Antiplatelet therapy Rehabilitation     FINAL CLINICAL IMPRESSION(S) / ED DIAGNOSES   Final diagnoses:  Atrial fibrillation with RVR ([place])  Nonspecific chest pain     Rx / DC Orders   ED Discharge Orders     None        Note:  This document was prepared using Dragon voice recognition software and may include unintentional dictation errors.   Simar Pothier, Delice Bison, DO 10/31/22 0327    Yer Olivencia, Delice Bison, DO 10/31/22 0334    Megahn Killings, Delice Bison, DO 10/31/22 [id number]

## 2022-11-01 ENCOUNTER — Inpatient Hospital Stay: Payer: Medicare Other

## 2022-11-01 ENCOUNTER — Observation Stay
Admit: 2022-11-01 | Discharge: 2022-11-01 | Disposition: A | Discharge status: Home / Self Care | Payer: Medicare Other | Attending: Hospitalist | Admitting: Hospitalist

## 2022-11-01 ENCOUNTER — Inpatient Hospital Stay: Payer: Self-pay

## 2022-11-01 DIAGNOSIS — I480 Paroxysmal atrial fibrillation: Secondary | ICD-10-CM | POA: Diagnosis present

## 2022-11-01 DIAGNOSIS — I252 Old myocardial infarction: Secondary | ICD-10-CM | POA: Diagnosis not present

## 2022-11-01 DIAGNOSIS — J439 Emphysema, unspecified: Secondary | ICD-10-CM | POA: Diagnosis present

## 2022-11-01 DIAGNOSIS — I4891 Unspecified atrial fibrillation: Secondary | ICD-10-CM | POA: Diagnosis not present

## 2022-11-01 DIAGNOSIS — I5181 Takotsubo syndrome: Secondary | ICD-10-CM | POA: Diagnosis present

## 2022-11-01 DIAGNOSIS — E785 Hyperlipidemia, unspecified: Secondary | ICD-10-CM | POA: Diagnosis present

## 2022-11-01 DIAGNOSIS — I959 Hypotension, unspecified: Secondary | ICD-10-CM | POA: Diagnosis present

## 2022-11-01 DIAGNOSIS — Z8249 Family history of ischemic heart disease and other diseases of the circulatory system: Secondary | ICD-10-CM | POA: Diagnosis not present

## 2022-11-01 DIAGNOSIS — I200 Unstable angina: Secondary | ICD-10-CM

## 2022-11-01 DIAGNOSIS — I5033 Acute on chronic diastolic (congestive) heart failure: Secondary | ICD-10-CM | POA: Diagnosis not present

## 2022-11-01 DIAGNOSIS — Z7982 Long term (current) use of aspirin: Secondary | ICD-10-CM | POA: Diagnosis not present

## 2022-11-01 DIAGNOSIS — I25110 Atherosclerotic heart disease of native coronary artery with unstable angina pectoris: Secondary | ICD-10-CM | POA: Diagnosis present

## 2022-11-01 DIAGNOSIS — I209 Angina pectoris, unspecified: Secondary | ICD-10-CM | POA: Diagnosis not present

## 2022-11-01 DIAGNOSIS — R079 Chest pain, unspecified: Secondary | ICD-10-CM | POA: Diagnosis not present

## 2022-11-01 DIAGNOSIS — Z800 Family history of malignant neoplasm of digestive organs: Secondary | ICD-10-CM | POA: Diagnosis not present

## 2022-11-01 DIAGNOSIS — E669 Obesity, unspecified: Secondary | ICD-10-CM | POA: Diagnosis present

## 2022-11-01 DIAGNOSIS — G2581 Restless legs syndrome: Secondary | ICD-10-CM | POA: Diagnosis present

## 2022-11-01 DIAGNOSIS — I498 Other specified cardiac arrhythmias: Secondary | ICD-10-CM | POA: Diagnosis present

## 2022-11-01 DIAGNOSIS — Z7901 Long term (current) use of anticoagulants: Secondary | ICD-10-CM | POA: Diagnosis not present

## 2022-11-01 DIAGNOSIS — I21A1 Myocardial infarction type 2: Secondary | ICD-10-CM | POA: Diagnosis present

## 2022-11-01 DIAGNOSIS — I2585 Chronic coronary microvascular dysfunction: Secondary | ICD-10-CM | POA: Diagnosis not present

## 2022-11-01 DIAGNOSIS — N179 Acute kidney failure, unspecified: Secondary | ICD-10-CM | POA: Diagnosis present

## 2022-11-01 DIAGNOSIS — I110 Hypertensive heart disease with heart failure: Secondary | ICD-10-CM | POA: Diagnosis present

## 2022-11-01 DIAGNOSIS — Z79899 Other long term (current) drug therapy: Secondary | ICD-10-CM | POA: Diagnosis not present

## 2022-11-01 DIAGNOSIS — R001 Bradycardia, unspecified: Secondary | ICD-10-CM | POA: Diagnosis not present

## 2022-11-01 LAB — ECHOCARDIOGRAM COMPLETE
AR max vel: 1.87 cm2
AV Area VTI: 1.77 cm2
AV Area mean vel: 1.75 cm2
AV Mean grad: 4 mmHg
AV Peak grad: 6.2 mmHg
Ao pk vel: 1.24 m/s
Area-P 1/2: 11.49 cm2
Calc EF: 61.9 %
Height: 61 in
S' Lateral: 2.3 cm
Single Plane A2C EF: 54.5 %
Single Plane A4C EF: 70.1 %
Weight: 1827.2 oz

## 2022-11-01 LAB — BASIC METABOLIC PANEL
Anion gap: 9 (ref 5–15)
Anion gap: 8 (ref 5–15)
BUN: 29 mg/dL — ABNORMAL HIGH (ref 8–23)
BUN: 30 mg/dL — ABNORMAL HIGH (ref 8–23)
CO2: 21 mmol/L — ABNORMAL LOW (ref 22–32)
CO2: 25 mmol/L (ref 22–32)
Calcium: 8.5 mg/dL — ABNORMAL LOW (ref 8.9–10.3)
Calcium: 8.4 mg/dL — ABNORMAL LOW (ref 8.9–10.3)
Chloride: 103 mmol/L (ref 98–111)
Chloride: 102 mmol/L (ref 98–111)
Creatinine, Ser: 1.53 mg/dL — ABNORMAL HIGH (ref 0.44–1.00)
Creatinine, Ser: 1.7 mg/dL — ABNORMAL HIGH (ref 0.44–1.00)
GFR, Estimated: 33 mL/min — ABNORMAL LOW (ref >60)
GFR, Estimated: 29 mL/min — ABNORMAL LOW (ref >60)
Glucose, Bld: 118 mg/dL — ABNORMAL HIGH (ref 70–99)
Glucose, Bld: 111 mg/dL — ABNORMAL HIGH (ref 70–99)
Potassium: 4.1 mmol/L (ref 3.5–5.1)
Potassium: 4 mmol/L (ref 3.5–5.1)
Sodium: 133 mmol/L — ABNORMAL LOW (ref 135–145)
Sodium: 135 mmol/L (ref 135–145)

## 2022-11-01 LAB — CBC
HCT: 32.3 % — ABNORMAL LOW (ref 36.0–46.0)
HCT: 32.5 % — ABNORMAL LOW (ref 36.0–46.0)
Hemoglobin: 10.5 g/dL — ABNORMAL LOW (ref 12.0–15.0)
Hemoglobin: 10.7 g/dL — ABNORMAL LOW (ref 12.0–15.0)
MCH: 29.2 pg (ref 26.0–34.0)
MCH: 29.2 pg (ref 26.0–34.0)
MCHC: 32.5 g/dL (ref 30.0–36.0)
MCHC: 32.9 g/dL (ref 30.0–36.0)
MCV: 89.7 fL (ref 80.0–100.0)
MCV: 88.8 fL (ref 80.0–100.0)
Platelets: 214 10*3/uL (ref 150–400)
Platelets: 206 10*3/uL (ref 150–400)
RBC: 3.6 MIL/uL — ABNORMAL LOW (ref 3.87–5.11)
RBC: 3.66 MIL/uL — ABNORMAL LOW (ref 3.87–5.11)
RDW: 15 % (ref 11.5–15.5)
RDW: 15 % (ref 11.5–15.5)
WBC: 8.9 10*3/uL (ref 4.0–10.5)
WBC: 10.6 10*3/uL — ABNORMAL HIGH (ref 4.0–10.5)
nRBC: 0 % (ref 0.0–0.2)
nRBC: 0 % (ref 0.0–0.2)

## 2022-11-01 LAB — APTT: aPTT: 78 seconds — ABNORMAL HIGH (ref 24–36)

## 2022-11-01 LAB — LACTIC ACID, PLASMA
Lactic Acid, Venous: 1.6 mmol/L (ref 0.5–1.9)
Lactic Acid, Venous: 1.5 mmol/L (ref 0.5–1.9)

## 2022-11-01 LAB — MAGNESIUM: Magnesium: 1.9 mg/dL (ref 1.7–2.4)

## 2022-11-01 LAB — HEPARIN LEVEL (UNFRACTIONATED): Heparin Unfractionated: 0.84 IU/mL — ABNORMAL HIGH (ref 0.30–0.70)

## 2022-11-01 LAB — GLUCOSE, CAPILLARY: Glucose-Capillary: 96 mg/dL (ref 70–99)

## 2022-11-01 MED ORDER — AMIODARONE HCL IN DEXTROSE 360-4.14 MG/200ML-% IV SOLN
INTRAVENOUS | Status: AC
  Administered 2022-11-01: 60 mg/h via INTRAVENOUS
  Filled 2022-11-01: qty 200

## 2022-11-01 MED ORDER — CHLORHEXIDINE GLUCONATE CLOTH 2 % EX PADS
6.0000 | MEDICATED_PAD | Freq: Every day | CUTANEOUS | Status: DC
  Administered 2022-11-01 – 2022-11-04 (×4): 6 via TOPICAL

## 2022-11-01 MED ORDER — NOREPINEPHRINE 4 MG/250ML-% IV SOLN: 0.0000 ug/min | INTRAVENOUS | Status: DC

## 2022-11-01 MED ORDER — AMIODARONE IV BOLUS ONLY 150 MG/100ML
INTRAVENOUS | Status: AC
  Administered 2022-11-01: 150 mg via INTRAVENOUS
  Filled 2022-11-01: qty 100

## 2022-11-01 MED ORDER — LACTATED RINGERS IV BOLUS
500.0000 mL | Freq: Once | INTRAVENOUS | Status: AC
  Administered 2022-11-01: 500 mL via INTRAVENOUS

## 2022-11-01 MED ORDER — AMIODARONE IV BOLUS ONLY 150 MG/100ML: 150.0000 mg | Freq: Once | INTRAVENOUS | Status: AC

## 2022-11-01 MED ORDER — NOREPINEPHRINE 4 MG/250ML-% IV SOLN
INTRAVENOUS | Status: AC
  Administered 2022-11-01: 2 ug/min via INTRAVENOUS
  Filled 2022-11-01: qty 250

## 2022-11-01 MED ORDER — SODIUM CHLORIDE 0.9% FLUSH
3.0000 mL | Freq: Two times a day (BID) | INTRAVENOUS | Status: DC
  Administered 2022-11-02 – 2022-11-07 (×10): 3 mL via INTRAVENOUS

## 2022-11-01 MED ORDER — SODIUM CHLORIDE 0.9 % IV BOLUS
500.0000 mL | Freq: Once | INTRAVENOUS | Status: AC
  Administered 2022-11-01: 500 mL via INTRAVENOUS

## 2022-11-01 MED ORDER — AMIODARONE HCL IN DEXTROSE 360-4.14 MG/200ML-% IV SOLN: 30.0000 mg/h | INTRAVENOUS | Status: DC

## 2022-11-01 MED ORDER — KETOROLAC TROMETHAMINE 15 MG/ML IJ SOLN: 15.0000 mg | Freq: Three times a day (TID) | INTRAMUSCULAR | Status: DC | PRN

## 2022-11-01 MED ORDER — PERFLUTREN LIPID MICROSPHERE
1.0000 mL | INTRAVENOUS | Status: AC | PRN
  Administered 2022-11-01: 3 mL via INTRAVENOUS

## 2022-11-01 MED ORDER — AMIODARONE HCL 200 MG PO TABS
200.0000 mg | ORAL_TABLET | Freq: Two times a day (BID) | ORAL | Status: AC
  Administered 2022-11-01 – 2022-11-05 (×8): 200 mg via ORAL
  Filled 2022-11-01 (×8): qty 1

## 2022-11-01 MED ORDER — AMIODARONE HCL 200 MG PO TABS: 200.0000 mg | ORAL_TABLET | Freq: Two times a day (BID) | ORAL | Status: DC

## 2022-11-01 MED ORDER — AMIODARONE HCL IN DEXTROSE 360-4.14 MG/200ML-% IV SOLN
60.0000 mg/h | INTRAVENOUS | Status: DC
  Administered 2022-11-01: 60 mg/h via INTRAVENOUS

## 2022-11-01 NOTE — Consult Note (Signed)
NAME:  Anne Mitchell, MRN:  [id number], DOB:  [birth year], LOS: 0 ADMISSION DATE:  10/31/2022, CONSULTATION DATE:  11/01/2022 REFERRING MD:  Dr. Billie Ruddy, CHIEF COMPLAINT:  Hypotension  Brief Pt Description / Synopsis:    History of Present Illness:  Per Hospitalist HPI:  Anne Mitchell is a 89 y.o. 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     On arrival of EMS heart rate was in the 140s and she was in A-fib.   Interval history: completed amio bolus and gtt and when into a junctional bradycardia abruptly at 1436. She has been completely asymptomatic. She became hypotensive. Amio gtt was stopped. Per cards recs, she was started on a levophed gtt. On my examination, she is completely asymptomatic. She denies any fevers or chills. Daughter is at bedside. They are watching tv together. She confirms full code status.   Pertinent  Medical History  Chest pain Possible angina History of nonobstructive coronary disease Stress-induced cardiomyopathy Hypertension Atrial fibrillation Borderline obesity Emphysema Diastolic heart failure   Objective   Blood pressure (!) 97/45, pulse (!) 50, temperature (!) 97.3 F (36.3 C), temperature source Oral, resp. rate (!) 21, height 5' 1"$  (1.549 m), weight 51.8 kg, SpO2 100 %.        Intake/Output Summary (Last 24 hours) at 11/01/2022 1653 Last data filed at 11/01/2022 1400 Gross per 24 hour  Intake 733.3 ml  Output 3 ml  Net 730.3 ml   Filed Weights   10/31/22 0142  Weight: 51.8 kg    Examination: General: elderly white female HENT: Normocephalic Lungs: CTAB Cardiovascular: regular rhythm, bradycardia Abdomen: soft Extremities: no peripheral  edema Neuro: intact GU: deferred  Assessment & Plan:   #Chest pain - possible unstable angina - on therapeutic heparin - appreciate cards eval and recs - troponinemia has peaked - awaiting formal echo read - toradol for atypical chest pain and associated shoulder pain  #CAD - continue statin  #Diastolic heart failure - judiciously give fluids - await echo read  #Afib with RVR and now junctional bradycardia - s/p amio bolus and gtt (drip paused at 1400 after bradycardia and associated hypotension) - unclear what caused this - right now, sinus brady with associated hypotension - gently gave 1L fluid (hospitalist also gave 1L fluid) - levophed running at low rate - continue to monitor on telemetry - holding po amio as well - holding imdur  #AKI - likely in setting CKD - Cr rose to 1.7 - avoid nephrotoxins - follow along with daily BMPs  Best Practice (right click and "Reselect all SmartList Selections" daily)   Diet/type: Regular consistency (see orders) DVT prophylaxis: systemic heparin GI prophylaxis: N/A Lines: PICC consult placed Foley:  N/A Code Status:  full code Last date of multidisciplinary goals of care discussion none  Labs   CBC: Recent Labs  Lab 10/31/22 0149 11/01/22 0351 11/01/22 0548  WBC 9.4 8.9 10.6*  NEUTROABS 6.3  --   --   HGB 13.6 10.5* 10.7*  HCT 41.9 32.3* 32.5*  MCV 89.1 89.7 88.8  PLT 290 214 [id number]    Basic Metabolic Panel: Recent Labs  Lab 10/31/22 0149 11/01/22 0351 11/01/22 0548  NA 137 133* 135  K 4.3 4.1 4.0  CL 99 103 102  CO2 28 21* 25  GLUCOSE 127* 118* 111*  BUN 20 29* 30*  CREATININE 1.04* 1.53* 1.70*  CALCIUM 9.5 8.5* 8.4*  MG 2.4 1.9  --    GFR: Estimated Creatinine Clearance: 17.3 mL/min (A) (by C-G formula based on SCr of 1.7 mg/dL (H)). Recent Labs  Lab 10/31/22 0149 11/01/22 0351 11/01/22 0548 11/01/22 1550  WBC 9.4 8.9 10.6*  --   LATICACIDVEN  --   --   --  1.6    Liver Function Tests: No  results for input(s): "AST", "ALT", "ALKPHOS", "BILITOT", "PROT", "ALBUMIN" in the last 168 hours. No results for input(s): "LIPASE", "AMYLASE" in the last 168 hours. No results for input(s): "AMMONIA" in the last 168 hours.  ABG No results found for: "PHART", "PCO2ART", "PO2ART", "HCO3", "TCO2", "ACIDBASEDEF", "O2SAT"   Coagulation Profile: Recent Labs  Lab 10/31/22 0524  INR 1.2    Cardiac Enzymes: No results for input(s): "CKTOTAL", "CKMB", "CKMBINDEX", "TROPONINI" in the last 168 hours.  HbA1C: No results found for: "HGBA1C"  CBG: Recent Labs  Lab 11/01/22 1228  GLUCAP 96    Review of Systems:   Positives in BOLD: Gen: Denies fever, chills, weight change, fatigue, night sweats HEENT: Denies blurred vision, double vision, hearing loss, tinnitus, sinus congestion, rhinorrhea, sore throat, neck stiffness, dysphagia PULM: Denies shortness of breath, cough, sputum production, hemoptysis, wheezing CV: Denies chest pain, edema, orthopnea, paroxysmal nocturnal dyspnea, palpitations GI: Denies abdominal pain, nausea, vomiting, diarrhea, hematochezia, melena, constipation, change in bowel habits GU: Denies dysuria, hematuria, polyuria, oliguria, urethral discharge Endocrine: Denies hot or cold intolerance, polyuria, polyphagia or appetite change Derm: Denies rash, dry skin, scaling or peeling skin change Heme: Denies easy bruising, bleeding, bleeding gums Neuro: Denies headache, numbness, weakness, slurred speech, loss of memory or consciousness   Past Medical History:  She,  has a past medical history of Diastolic heart failure (HCC), Emphysema (subcutaneous) (surgical) resulting from a procedure, Emphysema lung ([place]), and Hypertension.   Surgical History:   Past Surgical History:  Procedure Laterality Date   LEFT HEART CATH AND CORONARY ANGIOGRAPHY N/A 03/22/2020   Procedure: LEFT HEART CATH AND CORONARY ANGIOGRAPHY;  Surgeon: Corey Skains, MD;  Location: [facility];  Service: Cardiovascular;  Laterality: N/A;     Social History:   reports that she has never smoked. She has never used smokeless tobacco. She reports that she does not drink alcohol and does not use drugs.   Family History:  Her family history includes Colon cancer in her mother; Hypertension in her mother.   Allergies Allergies  Allergen Reactions   Other Rash and Swelling    "mycin" meds  Gin  Gin alcohol. Swelling of the genital area   Penicillin G Itching   Banana Swelling   Penicillins Swelling   Sulfa Antibiotics      Home Medications  Prior to Admission medications   Medication Sig Start Date End Date Taking? Authorizing Provider  albuterol (VENTOLIN HFA) 108 (90 Base) MCG/ACT inhaler Inhale 2 puffs into the lungs every 6 (six) hours as needed for wheezing or shortness of breath. 09/16/22  Yes Lavonia Drafts, MD  atorvastatin (LIPITOR) 80 MG tablet Take 1 tablet (80 mg total) by mouth daily. 03/23/20  Yes Wieting, Richard, MD  Calcium Carb-Cholecalciferol (CALCIUM 500 + D PO) Take 1 tablet by mouth as directed. Take 1 tablet three times a week on Sunday, Wednesday and Friday mornings.   Yes [provider]  Cholecalciferol 50 MCG (2000 UT) CAPS Take 2,000 Units by mouth daily.   Yes [provider]  diphenhydrAMINE (BENADRYL) 25 mg capsule Take 25 mg by mouth daily.   Yes [provider]  ELIQUIS 2.5 MG TABS tablet Take 2.5 mg by mouth 2 (two) times daily. 01/15/20  Yes [provider]  furosemide (LASIX) 40 MG tablet Take 40 mg by mouth daily. 02/20/20  Yes [provider]  isosorbide mononitrate (IMDUR) 30 MG 24 hr tablet Take 30 mg by mouth daily. 09/15/22  Yes [provider]  lisinopril (ZESTRIL) 40 MG tablet Take 40 mg by mouth daily. 02/20/20  Yes [provider]  Multiple Vitamins-Minerals (VITRUM SENIOR) TABS Take by mouth.   Yes [provider]  sertraline (ZOLOFT) 50 MG tablet Take 50 mg by  mouth daily. 01/12/20  Yes [provider]  spironolactone (ALDACTONE) 25 MG tablet Take 1 tablet (25 mg total) by mouth daily. 03/23/20  Yes Wieting, Richard, MD  aspirin EC 81 MG EC tablet Take 1 tablet (81 mg total) by mouth daily. Swallow whole. Patient not taking: Reported on 10/31/2022 03/23/20   Loletha Grayer, MD  metoprolol tartrate (LOPRESSOR) 25 MG tablet Take 0.5 tablets (12.5 mg total) by mouth 2 (two) times daily. Patient not taking: Reported on 10/31/2022 03/23/20   Loletha Grayer, MD     Critical care time: 73 minutes    Arcelia Jew MD [place]

## 2022-11-01 NOTE — Progress Notes (Signed)
Sheleigh RN notified that the PICC will not be placed today.

## 2022-11-01 NOTE — Significant Event (Signed)
Rapid Response Event Note   Reason for Call : called RRT for afib/rvr with hypotention   Initial Focused Assessment: laying in bed, responsive, see flowsheets for VS      Interventions: Dr Billie Ruddy and [facility] at bedside, move to stepdown, amio bolus ,and drip, ns bolus.   Plan of Care: as above    Event Summary:   MD Notified: Willa Rough [address] [place] A, RN

## 2022-11-01 NOTE — Progress Notes (Signed)
       CROSS COVER NOTE  NAME: Anne Mitchell MRN: [id number] DOB : 31-Aug-1932    HPI/Events of Note   Notified by pharmacy significant drop in blood counts on am labs and patient receiving heparin infusion   Assessment and  Interventions   Assessment: Over 3 gm HGB drop  No evidence of overt bleeding  Plan: Redraw to confirm counts      Kathlene Cote NP [facility]

## 2022-11-01 NOTE — Progress Notes (Signed)
ANTICOAGULATION CONSULT NOTE -   Pharmacy Consult for Heparin  Indication: chest pain/ACS  Allergies  Allergen Reactions   Other Rash and Swelling    "mycin" meds  Gin  Gin alcohol. Swelling of the genital area   Penicillin G Itching   Banana Swelling   Penicillins Swelling   Sulfa Antibiotics     Patient Measurements: Height: 5' 1"$  (154.9 cm) Weight: 51.8 kg (114 lb 3.2 oz) IBW/kg (Calculated) : 47.8 Heparin Dosing Weight: 51.8 kg   Vital Signs: Temp: 97 F (36.1 C) (02/18 0443) Temp Source: Oral (02/17 2314) BP: 99/45 (02/18 0443) Pulse Rate: 58 (02/18 0443)  Labs: Recent Labs    10/31/22 0149 10/31/22 0358 10/31/22 0524 10/31/22 0524 10/31/22 1341 10/31/22 2100 11/01/22 0351  HGB 13.6  --   --   --   --   --  10.5*  HCT 41.9  --   --   --   --   --  32.3*  PLT 290  --   --   --   --   --  214  APTT  --   --  32   < > 73* 85* 78*  LABPROT  --   --  15.5*  --   --   --   --   INR  --   --  1.2  --   --   --   --   HEPARINUNFRC  --   --  >1.10*  --   --   --  0.84*  CREATININE 1.04*  --   --   --   --   --  1.53*  TROPONINIHS 11 31*  --   --   --  39*  --    < > = values in this interval not displayed.     Estimated Creatinine Clearance: 19.2 mL/min (A) (by C-G formula based on SCr of 1.53 mg/dL (H)).   Medical History: Past Medical History:  Diagnosis Date   Diastolic heart failure (HCC)    Emphysema (subcutaneous) (surgical) resulting from a procedure    Not correct   Emphysema lung ([place])    Hypertension     Medications:  Medications Prior to Admission  Medication Sig Dispense Refill Last Dose   albuterol (VENTOLIN HFA) 108 (90 Base) MCG/ACT inhaler Inhale 2 puffs into the lungs every 6 (six) hours as needed for wheezing or shortness of breath. 8 g 2 prn at prn   atorvastatin (LIPITOR) 80 MG tablet Take 1 tablet (80 mg total) by mouth daily. 30 tablet 0 10/30/2022 at 1000   Calcium Carb-Cholecalciferol (CALCIUM 500 + D PO) Take 1 tablet by  mouth as directed. Take 1 tablet three times a week on Sunday, Wednesday and Friday mornings.   10/30/2022 at 1000   Cholecalciferol 50 MCG (2000 UT) CAPS Take 2,000 Units by mouth daily.   10/30/2022 at 1000   diphenhydrAMINE (BENADRYL) 25 mg capsule Take 25 mg by mouth daily.      ELIQUIS 2.5 MG TABS tablet Take 2.5 mg by mouth 2 (two) times daily.   10/30/2022 at 2200   furosemide (LASIX) 40 MG tablet Take 40 mg by mouth daily.   10/30/2022 at 1000   isosorbide mononitrate (IMDUR) 30 MG 24 hr tablet Take 30 mg by mouth daily.   10/30/2022 at 1000   lisinopril (ZESTRIL) 40 MG tablet Take 40 mg by mouth daily.   10/30/2022 at 1000   Multiple Vitamins-Minerals (VITRUM SENIOR) TABS Take  by mouth.   10/30/2022 at 1000   sertraline (ZOLOFT) 50 MG tablet Take 50 mg by mouth daily.   10/30/2022 at 1000   spironolactone (ALDACTONE) 25 MG tablet Take 1 tablet (25 mg total) by mouth daily. 30 tablet 0 10/30/2022 at 1000   aspirin EC 81 MG EC tablet Take 1 tablet (81 mg total) by mouth daily. Swallow whole. (Patient not taking: Reported on 10/31/2022) 30 tablet 0 Not Taking   metoprolol tartrate (LOPRESSOR) 25 MG tablet Take 0.5 tablets (12.5 mg total) by mouth 2 (two) times daily. (Patient not taking: Reported on 10/31/2022) 30 tablet 0 Not Taking    Assessment: Pharmacy consulted to dose heparin in this 89 year old female admitted with ACS/NSTEMI.  Pt was on Eliquis 2.5 mg PO BID, last dose on 2/16 @ 2200.  CrCl = 28.2 ml/min  2/17 1341 aPTT=73 Therapeutic x1 2/17 2100 aPTT=85 Therapeutic x 2 2/18 0351 aPTT=78,  HL = 0.84  Goal of Therapy:  aPTT 66-102 Heparin level 0.3-0.7 units/ml Monitor platelets by anticoagulation protocol: Yes   Plan:  2/18 @ 0351:  aPTT = 78 ,  HL = 0.84 aPTT therapeutic X 3,  HL still elevated from PTA Eliquis Will continue heparin drip @ 600 units/hr.  Will use aPTT to guide dosing until HL and aPTT correlate. Check aPTT and HL on 2/19 with AM labs Daily CBC while on  heparin  Lisbet Busker D, PharmD 11/01/2022,4:57 AM

## 2022-11-01 NOTE — Progress Notes (Signed)
  PROGRESS NOTE    Anne Mitchell  [id number] DOB: 05-Aug-1932 DOA: 10/31/2022 PCP: Valera Castle, MD  [id number]  LOS: 0 days   Brief hospital course:   Assessment & Plan: Anne Mitchell is a 89 y.o. 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    On arrival of EMS heart rate was in the 140s and she was in A-fib.   Afib w RVR Junctional bradycardia  Patient converted to junctional bradycardia in the 50s after diltiazem bolus and infusion.  Rate control agents held. --cardiology consulted Plan: --start amiodarone bolus+gtt --cont heparin gtt  Hypotension --developed hypotension this morning with BP down in 70's, likely due to Afib RVR. --transferred to stepdown --NS 500 ml bolus x2 --consider starting pressor levo if BP doesn't improve with IVF  Chest pain --seems atypical, right-sided, seemed to improve with IV toradol --started on heparin gtt on presentation Plan: --cont heparin gtt pending cardio rec --cont Imdur  --IV toradol PRN for pain  Trop elevation 2/2 demand ischemia --trop 11 then 30's, flat.  Likely demand from RVR. --further workup per cardio  CAD, history of NSTEMI 2021 History of stress cardiomyopathy 2021 Patient with history of multiple vessel but nonobstructive CAD on cath 2021 --cont statin --further workup per cardio  AKI --Cr 1.04 on presentation, up to 1.7 this morning --NS 500 ml bolus x2   DVT prophylaxis: [id number] gtt Code Status: Full code  Family Communication: daughter updated at bedside today Level of care: Stepdown Dispo:   The patient is from: home Anticipated d/c is to: home Anticipated d/c date is: 2-3  days   Subjective and Interval History:  Chest pain improved, however, HR labile.    Pt was brady this morning, then went into Afib RVR with BP dropping.  Rapid response called and pt was transferred to stepdown.   Objective: Vitals:   11/01/22 1329 11/01/22 1330 11/01/22 1345 11/01/22 1400  BP: (!) 112/91   (!) 97/47  Pulse: (!) 128 (!) 124 (!) 126 (!) 126  Resp: 20 (!) 25 20 20  $ Temp:      TempSrc:      SpO2: 97% 97% 97% 98%  Weight:      Height:        Intake/Output Summary (Last 24 hours) at 11/01/2022 1439 Last data filed at 11/01/2022 1400 Gross per 24 hour  Intake 733.3 ml  Output 3 ml  Net 730.3 ml   Filed Weights   10/31/22 0142  Weight: 51.8 kg    Examination:   Constitutional: NAD, AAOx3 HEENT: conjunctivae and lids normal, EOMI CV: No cyanosis.   RESP: normal respiratory effort Neuro: II - XII grossly intact.   Psych: subdued mood and affect.     Data Reviewed: I have personally review ed labs and imaging studies  Time spent: 90 minutes, including critical care time 30 minutes with rapid response call.   Enzo Bi, MD [facility] If 7PM-7AM, please contact night-coverage 11/01/2022, 2:39 PM

## 2022-11-01 NOTE — Progress Notes (Signed)
[facility] Cardiology    SUBJECTIVE: Patient with paroxysmal atrial fibrillation and tachycardia with atypical chest pain symptoms denies any significant palpitations episodes of hypotension unclear etiology feels somewhat improved after transfer from telemetry to ICU no fever no nausea vomiting or diarrhea   Vitals:   11/01/22 1222 11/01/22 1230 11/01/22 1244 11/01/22 1245  BP: (!) 86/49 (!) 78/47 (!) 81/40 (!) 73/53  Pulse: (!) 127  (!) 131 90  Resp:  18 (!) 26 (!) 25  Temp:  97.9 F (36.6 C)    TempSrc:  Oral    SpO2: 98% 94% 96% 98%  Weight:      Height:         Intake/Output Summary (Last 24 hours) at 11/01/2022 1315 Last data filed at 11/01/2022 0900 Gross per 24 hour  Intake 149.53 ml  Output 3 ml  Net 146.53 ml      PHYSICAL EXAM  General: Well developed, well nourished, in no acute distress HEENT:  Normocephalic and atramatic Neck:  No JVD.  Lungs: Clear bilaterally to auscultation and percussion. Heart: HRRR . Normal S1 and S2 without gallops or murmurs.  Abdomen: Bowel sounds are positive, abdomen soft and non-tender  Msk:  Back normal, normal gait. Normal strength and tone for age. Extremities: No clubbing, cyanosis or edema.   Neuro: Alert and oriented X 3. Psych:  Good affect, responds appropriately   LABS: Basic Metabolic Panel: Recent Labs    10/31/22 0149 11/01/22 0351 11/01/22 0548  NA 137 133* 135  K 4.3 4.1 4.0  CL 99 103 102  CO2 28 21* 25  GLUCOSE 127* 118* 111*  BUN 20 29* 30*  CREATININE 1.04* 1.53* 1.70*  CALCIUM 9.5 8.5* 8.4*  MG 2.4 1.9  --    Liver Function Tests: No results for input(s): "AST", "ALT", "ALKPHOS", "BILITOT", "PROT", "ALBUMIN" in the last 72 hours. No results for input(s): "LIPASE", "AMYLASE" in the last 72 hours. CBC: Recent Labs    10/31/22 0149 11/01/22 0351 11/01/22 0548  WBC 9.4 8.9 10.6*  NEUTROABS 6.3  --   --   HGB 13.6 10.5* 10.7*  HCT 41.9 32.3* 32.5*  MCV 89.1 89.7 88.8  PLT 290 214 206   Cardiac  Enzymes: No results for input(s): "CKTOTAL", "CKMB", "CKMBINDEX", "TROPONINI" in the last 72 hours. BNP: Invalid input(s): "POCBNP" D-Dimer: Recent Labs    10/31/22 0149  DDIMER 0.67*   Hemoglobin A1C: No results for input(s): "HGBA1C" in the last 72 hours. Fasting Lipid Panel: No results for input(s): "CHOL", "HDL", "LDLCALC", "TRIG", "CHOLHDL", "LDLDIRECT" in the last 72 hours. Thyroid Function Tests: No results for input(s): "TSH", "T4TOTAL", "T3FREE", "THYROIDAB" in the last 72 hours.  Invalid input(s): "FREET3" Anemia Panel: No results for input(s): "VITAMINB12", "FOLATE", "FERRITIN", "TIBC", "IRON", "RETICCTPCT" in the last 72 hours.  DG Shoulder Right  Result Date: 10/31/2022 CLINICAL DATA:  Right shoulder pain. EXAM: RIGHT SHOULDER - 2+ VIEW COMPARISON:  Chest radiograph dated 10/31/2022. FINDINGS: No acute fracture or dislocation the bones are osteopenic. Mild degenerative changes of the right shoulder. Rounded osseous density over the humeral neck may represent loose bodies. The soft tissues are unremarkable. IMPRESSION: 1. No acute fracture or dislocation. 2. Mild degenerative changes of the right shoulder. Electronically Signed   By: Anner Crete M.D.   On: 10/31/2022 02:22   DG Chest 2 View  Result Date: 10/31/2022 CLINICAL DATA:  Chest pain. EXAM: CHEST - 2 VIEW COMPARISON:  Chest radiograph dated 09/16/2022. FINDINGS: There is diffuse chronic interstitial  coarsening. Mild elevation of the right hemidiaphragm. No focal consolidation, pleural effusion or pneumothorax. The cardiac silhouette is within limits. Osteopenia with degenerative changes of the spine. No acute osseous pathology. IMPRESSION: 1. No acute cardiopulmonary process. 2. Chronic interstitial coarsening. Electronically Signed   By: Anner Crete M.D.   On: 10/31/2022 02:21     Echo preserved overall left ventricular function 55% no significant valve abnormalities  TELEMETRY: Sinus bradycardia rate of  50 nonspecific EKG changes Episodes of rapid atrial fibrillation as high as 150:  ASSESSMENT AND PLAN:  Principal Problem:   Unstable angina (HCC) Active Problems:   Essential hypertension   Chronic anticoagulation   Rapid atrial fibrillation (HCC)   Chest pain   Coronary artery disease   Bradyarrhythmia   Atrial fibrillation with rapid ventricular response (HCC)    Plan Recurrent hypotension unclear etiology patient currently receiving fluid resuscitation and low-dose pressor therapy with significant improvement we will continue current management Recommend CT of the chest and abdomen without contrast for further evaluation for possible etiology of hypotension Paroxysmal atrial fibrillation on amiodarone transition from IV to p.o. now with significant bradycardia no clear indication for temporary or permanent pacemaker at this point Maintain IV anticoagulation with heparin for now for atrial fibrillation as well as possible ACS Atypical chest pain symptoms may represent anginal equivalent negative enzymes nondiagnostic EKG will consider ischemia workup either with a functional study or cardiac cath Continue ICU level care because of hypotension and need for pressor therapy for blood pressure support Continue to monitor patient consider further workup   Yolonda Kida, MD 11/01/2022 1:15 PM

## 2022-11-02 ENCOUNTER — Encounter
Admission: EM | Disposition: A | Discharge status: Home Health | Payer: Self-pay | Source: Home / Self Care | Attending: Hospitalist

## 2022-11-02 DIAGNOSIS — I2585 Chronic coronary microvascular dysfunction: Secondary | ICD-10-CM | POA: Diagnosis not present

## 2022-11-02 DIAGNOSIS — I4891 Unspecified atrial fibrillation: Secondary | ICD-10-CM | POA: Diagnosis not present

## 2022-11-02 DIAGNOSIS — I200 Unstable angina: Secondary | ICD-10-CM | POA: Diagnosis not present

## 2022-11-02 DIAGNOSIS — I498 Other specified cardiac arrhythmias: Secondary | ICD-10-CM | POA: Diagnosis not present

## 2022-11-02 LAB — CBC
HCT: 31.5 % — ABNORMAL LOW (ref 36.0–46.0)
Hemoglobin: 10.2 g/dL — ABNORMAL LOW (ref 12.0–15.0)
MCH: 29.1 pg (ref 26.0–34.0)
MCHC: 32.4 g/dL (ref 30.0–36.0)
MCV: 90 fL (ref 80.0–100.0)
Platelets: 212 10*3/uL (ref 150–400)
RBC: 3.5 MIL/uL — ABNORMAL LOW (ref 3.87–5.11)
RDW: 14.9 % (ref 11.5–15.5)
WBC: 8.7 10*3/uL (ref 4.0–10.5)
nRBC: 0 % (ref 0.0–0.2)

## 2022-11-02 LAB — BASIC METABOLIC PANEL
Anion gap: 6 (ref 5–15)
BUN: 29 mg/dL — ABNORMAL HIGH (ref 8–23)
CO2: 23 mmol/L (ref 22–32)
Calcium: 8.1 mg/dL — ABNORMAL LOW (ref 8.9–10.3)
Chloride: 104 mmol/L (ref 98–111)
Creatinine, Ser: 1.31 mg/dL — ABNORMAL HIGH (ref 0.44–1.00)
GFR, Estimated: 39 mL/min — ABNORMAL LOW (ref >60)
Glucose, Bld: 108 mg/dL — ABNORMAL HIGH (ref 70–99)
Potassium: 4.4 mmol/L (ref 3.5–5.1)
Sodium: 133 mmol/L — ABNORMAL LOW (ref 135–145)

## 2022-11-02 LAB — HEPARIN LEVEL (UNFRACTIONATED)
Heparin Unfractionated: 0.92 IU/mL — ABNORMAL HIGH (ref 0.30–0.70)
Heparin Unfractionated: 0.37 IU/mL (ref 0.30–0.70)

## 2022-11-02 LAB — MAGNESIUM: Magnesium: 1.7 mg/dL (ref 1.7–2.4)

## 2022-11-02 LAB — BRAIN NATRIURETIC PEPTIDE: B Natriuretic Peptide: 771.6 pg/mL — ABNORMAL HIGH (ref 0.0–100.0)

## 2022-11-02 LAB — TROPONIN I (HIGH SENSITIVITY)
Troponin I (High Sensitivity): 127 ng/L (ref <18)
Troponin I (High Sensitivity): 103 ng/L (ref <18)

## 2022-11-02 LAB — APTT
aPTT: >200 seconds (ref 24–36)
aPTT: 91 seconds — ABNORMAL HIGH (ref 24–36)

## 2022-11-02 SURGERY — LEFT HEART CATH AND CORONARY ANGIOGRAPHY
Anesthesia: Moderate Sedation

## 2022-11-02 MED ORDER — ASPIRIN 81 MG PO TBEC
81.0000 mg | DELAYED_RELEASE_TABLET | Freq: Every day | ORAL | Status: DC
  Administered 2022-11-02 – 2022-11-07 (×5): 81 mg via ORAL
  Filled 2022-11-02 (×5): qty 1

## 2022-11-02 MED ORDER — SIMETHICONE 80 MG PO CHEW
80.0000 mg | CHEWABLE_TABLET | Freq: Four times a day (QID) | ORAL | Status: DC
  Administered 2022-11-02 – 2022-11-07 (×16): 80 mg via ORAL
  Filled 2022-11-02 (×21): qty 1

## 2022-11-02 MED ORDER — ENSURE ENLIVE PO LIQD
237.0000 mL | Freq: Two times a day (BID) | ORAL | Status: DC
  Administered 2022-11-02 – 2022-11-05 (×4): 237 mL via ORAL

## 2022-11-02 MED ORDER — LACTATED RINGERS IV BOLUS
250.0000 mL | Freq: Once | INTRAVENOUS | Status: AC
  Administered 2022-11-02: 250 mL via INTRAVENOUS

## 2022-11-02 MED ORDER — SODIUM CHLORIDE 0.9 % IV SOLN: INTRAVENOUS | Status: DC

## 2022-11-02 MED ORDER — SODIUM CHLORIDE 0.9 % IV SOLN: 250.0000 mL | INTRAVENOUS | Status: DC | PRN

## 2022-11-02 MED ORDER — ADULT MULTIVITAMIN W/MINERALS CH
1.0000 | ORAL_TABLET | Freq: Every day | ORAL | Status: DC
  Administered 2022-11-03 – 2022-11-07 (×4): 1 via ORAL
  Filled 2022-11-02 (×4): qty 1

## 2022-11-02 MED ORDER — SODIUM CHLORIDE 0.9% FLUSH: 3.0000 mL | INTRAVENOUS | Status: DC | PRN

## 2022-11-02 MED ORDER — ASPIRIN 81 MG PO CHEW: 81.0000 mg | CHEWABLE_TABLET | ORAL | Status: AC

## 2022-11-02 NOTE — Progress Notes (Signed)
Secure chat Primary RN if PICC still needed. Per RN, "md d/c'd order for now unless cardiology wants it later."

## 2022-11-02 NOTE — Progress Notes (Signed)
ANTICOAGULATION CONSULT NOTE -   Pharmacy Consult for Heparin  Indication: chest pain/ACS  Allergies  Allergen Reactions   Other Rash and Swelling    "mycin" meds  Gin  Gin alcohol. Swelling of the genital area   Penicillin G Itching   Banana Swelling   Penicillins Swelling   Sulfa Antibiotics     Patient Measurements: Height: 5' 1"$  (154.9 cm) Weight: 51.8 kg (114 lb 3.2 oz) IBW/kg (Calculated) : 47.8 Heparin Dosing Weight: 51.8 kg   Vital Signs: Temp: 98.4 F (36.9 C) (02/19 0500) Temp Source: Oral (02/19 0500) BP: 115/47 (02/19 0600) Pulse Rate: 52 (02/19 0600)  Labs: Recent Labs     0000 10/31/22 0149 10/31/22 0358 10/31/22 0524 10/31/22 1341 10/31/22 2100 11/01/22 0351 11/01/22 0548 11/02/22 0302 11/02/22 0620  HGB  --  13.6  --   --   --   --  10.5* 10.7* 10.2*  --   HCT  --  41.9  --   --   --   --  32.3* 32.5* 31.5*  --   PLT  --  290  --   --   --   --  214 206 212  --   APTT  --   --   --  32   < > 85* 78*  --  >200* 91*  LABPROT  --   --   --  15.5*  --   --   --   --   --   --   INR  --   --   --  1.2  --   --   --   --   --   --   HEPARINUNFRC   < >  --   --  >1.10*  --   --  0.84*  --  0.92* 0.37  CREATININE  --  1.04*  --   --   --   --  1.53* 1.70* 1.31*  --   TROPONINIHS  --  11 31*  --   --  39*  --   --   --   --    < > = values in this interval not displayed.     Estimated Creatinine Clearance: 22.4 mL/min (A) (by C-G formula based on SCr of 1.31 mg/dL (H)).   Medical History: Past Medical History:  Diagnosis Date   Diastolic heart failure (HCC)    Emphysema (subcutaneous) (surgical) resulting from a procedure    Not correct   Emphysema lung ([place])    Hypertension     Medications:  Medications Prior to Admission  Medication Sig Dispense Refill Last Dose   albuterol (VENTOLIN HFA) 108 (90 Base) MCG/ACT inhaler Inhale 2 puffs into the lungs every 6 (six) hours as needed for wheezing or shortness of breath. 8 g 2 prn at prn    atorvastatin (LIPITOR) 80 MG tablet Take 1 tablet (80 mg total) by mouth daily. 30 tablet 0 10/30/2022 at 1000   Calcium Carb-Cholecalciferol (CALCIUM 500 + D PO) Take 1 tablet by mouth as directed. Take 1 tablet three times a week on Sunday, Wednesday and Friday mornings.   10/30/2022 at 1000   Cholecalciferol 50 MCG (2000 UT) CAPS Take 2,000 Units by mouth daily.   10/30/2022 at 1000   diphenhydrAMINE (BENADRYL) 25 mg capsule Take 25 mg by mouth daily.      ELIQUIS 2.5 MG TABS tablet Take 2.5 mg by mouth 2 (two) times daily.   10/30/2022 at  2200   furosemide (LASIX) 40 MG tablet Take 40 mg by mouth daily.   10/30/2022 at 1000   isosorbide mononitrate (IMDUR) 30 MG 24 hr tablet Take 30 mg by mouth daily.   10/30/2022 at 1000   lisinopril (ZESTRIL) 40 MG tablet Take 40 mg by mouth daily.   10/30/2022 at 1000   Multiple Vitamins-Minerals (VITRUM SENIOR) TABS Take by mouth.   10/30/2022 at 1000   sertraline (ZOLOFT) 50 MG tablet Take 50 mg by mouth daily.   10/30/2022 at 1000   spironolactone (ALDACTONE) 25 MG tablet Take 1 tablet (25 mg total) by mouth daily. 30 tablet 0 10/30/2022 at 1000   aspirin EC 81 MG EC tablet Take 1 tablet (81 mg total) by mouth daily. Swallow whole. (Patient not taking: Reported on 10/31/2022) 30 tablet 0 Not Taking   metoprolol tartrate (LOPRESSOR) 25 MG tablet Take 0.5 tablets (12.5 mg total) by mouth 2 (two) times daily. (Patient not taking: Reported on 10/31/2022) 30 tablet 0 Not Taking    Assessment: Pharmacy consulted to dose heparin in this 89 year old female admitted with ACS/NSTEMI.  Pt was on Eliquis 2.5 mg PO BID, last dose on 2/16 @ 2200.  CrCl = 28.2 ml/min  2/17 1341 aPTT=73 Therapeutic x1 2/17 2100 aPTT=85 Therapeutic x 2 2/18 0351 aPTT=78,  HL = 0.84 2/19 0302 aPTT=> 200,  HL = 0.91 (suspect lab error) 2/19 0620 aPTT=91, HL = 0.37, therapeutic X 4   Goal of Therapy:  aPTT 66-102 Heparin level 0.3-0.7 units/ml Monitor platelets by anticoagulation protocol:  Yes   Plan:  2/19 @ 0302:  aPTT = > 200,  HL = 0.91 - suspect lab error b/c prior 3 aPTT have been therapeutic, will order STAT repeat aPTT and HL  2/19 @ 0620:  aPTT = 91,  HL = 0.37 - aPTT therapeutic X 4 , now correlating with HL  - will use HL to guide dosing from now on - will continue pt on current rate and recheck HL on 2/20 with     AM labs.   Daily CBC while on heparin  Shebra Muldrow D, PharmD 11/02/2022,6:54 AM

## 2022-11-02 NOTE — Progress Notes (Addendum)
NAME:  Anne Mitchell, MRN:  [id number], DOB:  09/01/1932, LOS: 1 ADMISSION DATE:  10/31/2022, CHIEF COMPLAINT:  afib with RVR   History of Present Illness:   Per Hospitalist HPI:  Anne Mitchell is a 89 y.o. 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     On arrival of EMS heart rate was in the 140s and she was in A-fib.    Interval history: completed amio bolus and gtt and when into a junctional bradycardia abruptly at 1436. She has been completely asymptomatic. She became hypotensive. Amio gtt was stopped. Per cards recs, she was started on a levophed gtt. She was completely asymptomatic. She denies any fevers or chills.  Pertinent  Medical History  Chest pain Possible angina History of nonobstructive coronary disease Stress-induced cardiomyopathy Hypertension Atrial fibrillation Borderline obesity Emphysema Diastolic heart failure  Significant Hospital Events: Including procedures, antibiotic start and stop dates in addition to other pertinent events   11/01/2022: admit to the ICU, on amiodarone 11/02/2022: off pressors  Interim History / Subjective:  Feels well today. No chest pain, no palpitations, no complaints  Objective   Blood pressure (!) 128/37, pulse (!) 56, temperature 98 F (36.7 C), temperature source Oral, resp. rate 18, height 5' 1"$  (1.549 m), weight 51.8 kg, SpO2 100 %.        Intake/Output Summary (Last 24 hours) at 11/02/2022 0954 Last data filed at 11/02/2022 0800 Gross per 24 hour  Intake 2424.67 ml  Output 250 ml  Net 2174.67 ml   Filed Weights   10/31/22 0142 11/02/22 0136  Weight: 51.8 kg 51.8 kg    Examination: Physical Exam Constitutional:       Appearance: Normal appearance. She is not ill-appearing.  HENT:     Head: Normocephalic.     Nose: Nose normal.  Cardiovascular:     Rate and Rhythm: Regular rhythm. Bradycardia present.     Pulses: Normal pulses.     Heart sounds: Normal heart sounds.  Pulmonary:     Effort: Pulmonary effort is normal.     Breath sounds: Normal breath sounds.  Abdominal:     Palpations: Abdomen is soft.  Neurological:     General: No focal deficit present.     Mental Status: She is alert and oriented to person, place, and time.     Assessment & Plan:   Neurology No active issues, on sertraline and quetiapine for anxiety.  Cardiovascular #CAD #HFpEF #Afib with RVR #Chest Pain  Concern for unstable angina, troponin mildly elevated and will continue to trend. Cardiology consulted and recs appreciated, she will get a nuclear stress test, holding on LHC for the time being. Will continue IV heparin for ACS. Does have a history of heart failure so will be judicious with fluids, already received two liters. Afib with RVR treated with amiodarone, now on 200 mg bid, rhythm is currently sinus bradycardia.  -TTE -Continue statin, Aspirin -cards consult appreciated -goal SPB > 90 mmHg  Pulmonary No active issues, on room air  Gastrointestinal No active issues. Cardiac diet  Renal #AKI on CKD  Kidney function mildly improved. Avoid nephrotoxins  Endocrine Monitor  Hem/Onc Heparin gtt, monitor platelets and H/H  ID No active issues  Best Practice (right click and "Reselect all SmartList Selections" daily)   Diet/type: Regular consistency (see orders) DVT prophylaxis: systemic heparin GI prophylaxis: N/A Lines: N/A Foley:  N/A Code Status:  full code Last date of multidisciplinary goals of care discussion [11/02/2022]  Labs   CBC: Recent Labs  Lab 10/31/22 0149 11/01/22 0351 11/01/22 0548 11/02/22 0302  WBC 9.4 8.9 10.6* 8.7  NEUTROABS 6.3  --   --   --   HGB 13.6 10.5*  10.7* 10.2*  HCT 41.9 32.3* 32.5* 31.5*  MCV 89.1 89.7 88.8 90.0  PLT 290 214 206 [id number]    Basic Metabolic Panel: Recent Labs  Lab 10/31/22 0149 11/01/22 0351 11/01/22 0548 11/02/22 0302  NA 137 133* 135 133*  K 4.3 4.1 4.0 4.4  CL 99 103 102 104  CO2 28 21* 25 23  GLUCOSE 127* 118* 111* 108*  BUN 20 29* 30* 29*  CREATININE 1.04* 1.53* 1.70* 1.31*  CALCIUM 9.5 8.5* 8.4* 8.1*  MG 2.4 1.9  --  1.7   GFR: Estimated Creatinine Clearance: 22.4 mL/min (A) (by C-G formula based on SCr of 1.31 mg/dL (H)). Recent Labs  Lab 10/31/22 0149 11/01/22 0351 11/01/22 0548 11/01/22 1550 11/01/22 1842 11/02/22 0302  WBC 9.4 8.9 10.6*  --   --  8.7  LATICACIDVEN  --   --   --  1.6 1.5  --     Liver Function Tests: No results for input(s): "AST", "ALT", "ALKPHOS", "BILITOT", "PROT", "ALBUMIN" in the last 168 hours. No results for input(s): "LIPASE", "AMYLASE" in the last 168 hours. No results for input(s): "AMMONIA" in the last 168 hours.  ABG No results found for: "PHART", "PCO2ART", "PO2ART", "HCO3", "TCO2", "ACIDBASEDEF", "O2SAT"   Coagulation Profile: Recent Labs  Lab 10/31/22 0524  INR 1.2    Cardiac Enzymes: No results for input(s): "CKTOTAL", "CKMB", "CKMBINDEX", "TROPONINI" in the last 168 hours.  HbA1C: No results found for: "HGBA1C"  CBG: Recent Labs  Lab 11/01/22 1228  GLUCAP 96     Past Medical History:  She,  has a past medical history of Diastolic heart failure ([place]), Emphysema (subcutaneous) (surgical) resulting from a procedure, Emphysema lung ([place]), and Hypertension.   Surgical History:   Past Surgical History:  Procedure Laterality Date   LEFT HEART CATH AND CORONARY ANGIOGRAPHY N/A 03/22/2020   Procedure: LEFT HEART CATH AND CORONARY ANGIOGRAPHY;  Surgeon: Corey Skains, MD;  Location: [facility];  Service: Cardiovascular;  Laterality: N/A;     Social History:   reports that she has never smoked. She has never used smokeless  tobacco. She reports that she does not drink alcohol and does not use drugs.   Family History:  Her family history includes Colon cancer in her mother; Hypertension in her mother.   Allergies Allergies  Allergen Reactions   Other Rash and Swelling    "mycin" meds  Gin  Gin alcohol. Swelling of the genital area   Penicillin G Itching   Banana Swelling   Penicillins Swelling   Sulfa Antibiotics      Home Medications  Prior to Admission medications   Medication Sig Start Date End Date Taking? Authorizing Provider  albuterol (VENTOLIN HFA) 108 (90 Base) MCG/ACT inhaler Inhale 2 puffs into the lungs every 6 (six) hours as needed for wheezing or shortness of breath. 09/16/22  Yes Lavonia Drafts, MD  atorvastatin (LIPITOR) 80 MG tablet Take 1 tablet (80 mg total) by mouth daily. 03/23/20  Yes Loletha Grayer, MD  Calcium Carb-Cholecalciferol (CALCIUM 500 + D PO) Take 1 tablet by mouth as directed. Take 1 tablet three times a week on Sunday, Wednesday and Friday mornings.   Yes [provider]  Cholecalciferol 50 MCG (2000 UT) CAPS Take 2,000 Units by mouth daily.   Yes [provider]  diphenhydrAMINE (BENADRYL) 25 mg capsule Take 25 mg by mouth daily.   Yes [provider]  ELIQUIS 2.5 MG TABS tablet Take 2.5 mg by mouth 2 (two) times daily. 01/15/20  Yes [provider]  furosemide (LASIX) 40 MG tablet Take 40 mg by mouth daily. 02/20/20  Yes [provider]  isosorbide mononitrate (IMDUR) 30 MG 24 hr tablet Take 30 mg by mouth daily. 09/15/22  Yes [provider]  lisinopril (ZESTRIL) 40 MG tablet Take 40 mg by mouth daily. 02/20/20  Yes [provider]  Multiple Vitamins-Minerals (VITRUM SENIOR) TABS Take by mouth.   Yes [provider]  sertraline (ZOLOFT) 50 MG tablet Take 50 mg by mouth daily. 01/12/20  Yes [provider]  spironolactone (ALDACTONE) 25 MG tablet Take 1 tablet (25 mg total) by mouth daily. 03/23/20   Yes Wieting, Richard, MD  aspirin EC 81 MG EC tablet Take 1 tablet (81 mg total) by mouth daily. Swallow whole. Patient not taking: Reported on 10/31/2022 03/23/20   Loletha Grayer, MD  metoprolol tartrate (LOPRESSOR) 25 MG tablet Take 0.5 tablets (12.5 mg total) by mouth 2 (two) times daily. Patient not taking: Reported on 10/31/2022 03/23/20   Loletha Grayer, MD      I spent 50 minutes caring for this patient today, including preparing to see the patient, obtaining a medical history , reviewing a separately obtained history, performing a medically appropriate examination and/or evaluation, counseling and educating the patient/family/caregiver, ordering medications, tests, or procedures, and documenting clinical information in the electronic health record.    Armando Reichert, MD [facility] 11/02/2022 11:06 AM

## 2022-11-02 NOTE — Progress Notes (Signed)
Initial Nutrition Assessment  DOCUMENTATION CODES:   Not applicable  INTERVENTION:   Ensure Enlive po BID, each supplement provides 350 kcal and 20 grams of protein.  MVI po daily   Liberalize diet   Daily weights   Pt at mild refeed risk; recommend monitor potassium, magnesium and phosphorus labs daily until stable  NUTRITION DIAGNOSIS:   Increased nutrient needs related to catabolic illness (Emphysema, CHF) as evidenced by estimated needs.  GOAL:   Patient will meet greater than or equal to 90% of their needs  MONITOR:   PO intake, Supplement acceptance, Labs, Weight trends, I & O's, Skin  REASON FOR ASSESSMENT:   Malnutrition Screening Tool    ASSESSMENT:   89 y/o female with h/o breast cancer, HTN, PAF, CHF, emphysema and NSTEMI who is admitted with unstable angina.  Met with pt in room today. Pt reports fairly good appetite and oral intake at baseline; pt reports eating oatmeal with berries, prune juice and orange juice for breakfast every morning. Pt's breakfast tray was sitting on her side table and was 80% eaten this morning. Pt reports that she does not drink supplements at home but she is willing to drink strawberry Ensure in hospital. RD will add supplements and MVI to help pt meet her estimated needs. Pt is at refeed risk. Per chart, pt appears weight stable at baseline; pt reports her UBW is ~114-119lbs.   Medications reviewed and include: aspirin, heparin  Labs reviewed: Na 133(L), K 4.4 wnl, BUN 29(H), creat 1.31(H), Mg 1.7 wnl BNP- 771.6(H) Hgb 10.2(L), Hct 31.5(L)  NUTRITION - FOCUSED PHYSICAL EXAM:  Flowsheet Row Most Recent Value  Orbital Region No depletion  Upper Arm Region No depletion  Thoracic and Lumbar Region No depletion  Buccal Region No depletion  Temple Region No depletion  Clavicle Bone Region Mild depletion  Clavicle and Acromion Bone Region Mild depletion  Scapular Bone Region No depletion  Dorsal Hand Moderate depletion   Patellar Region Mild depletion  Anterior Thigh Region Mild depletion  Posterior Calf Region Mild depletion  Edema (RD Assessment) None  Hair Reviewed  Eyes Reviewed  Mouth Reviewed  Skin Reviewed  Nails Reviewed   Diet Order:   Diet Order             Diet regular Room service appropriate? Yes with Assist; Fluid consistency: Thin; Fluid restriction: 1500 mL Fluid  Diet effective now                  EDUCATION NEEDS:   Education needs have been addressed  Skin:  Skin Assessment: Reviewed RN Assessment  Last BM:  2/19- type 2  Height:   Ht Readings from Last 1 Encounters:  10/31/22 5' 1"$  (1.549 m)    Weight:   Wt Readings from Last 1 Encounters:  11/02/22 51.8 kg    Ideal Body Weight:  47.7 kg  BMI:  Body mass index is 21.58 kg/m.  Estimated Nutritional Needs:   Kcal:  1300-1500kcal/day  Protein:  65-75g/day  Fluid:  1.3-1.5L/day  Koleen Distance MS, RD, LDN Please refer to [facility] for RD and/or RD on-call/weekend/after hours pager

## 2022-11-02 NOTE — Progress Notes (Signed)
[facility] NOTE       Patient ID: Anne Mitchell MRN: [id number] DOB/AGE: 87/11/1931 87 y.o.  Admit date: 10/31/2022 Referring Physician Dr. Judd Gaudier Primary Physician Dr. Kym Groom Primary Cardiologist Dr. Nehemiah Massed Reason for Consultation chest pain, bradycardia  HPI: Anne Mitchell is an 57yoF with a PMH of HF w/ recovered EF (55-60%, g1DD 10/2022, prev 25-30%, multiple rWMAs 2021), hx takotsubo CM, paroxysmal AF (eliquis), CKD 3 who presented to [facility] ED 10/31/22 with right sided chest pain that radiated to her shoulder. She was in AF RVR on presentation, converted to a junctional bradycardia on amiodarone + a 3 second sinus pause with conversion. She had hypotension requiring levophed the PM of 2/18 and intermittent right sided chest discomfort, responsive to NSAIDS.   Interval History: - feels ok today, no chest discomfort right now. She describes it to me as a feeling of a "book sitting on her chest" and "pulsations" with some exertional component but also initiated while she was sitting in bed playing solitaire at home. - no shortness of breath, peripheral edema, lightheadedness or dizziness - in sinus brady on tele rates high 40s-mid 50s. Normotensive.   Review of systems complete and found to be negative unless listed above     Past Medical History:  Diagnosis Date   Diastolic heart failure (HCC)    Emphysema (subcutaneous) (surgical) resulting from a procedure    Not correct   Emphysema lung ([place])    Hypertension     Past Surgical History:  Procedure Laterality Date   LEFT HEART CATH AND CORONARY ANGIOGRAPHY N/A 03/22/2020   Procedure: LEFT HEART CATH AND CORONARY ANGIOGRAPHY;  Surgeon: Corey Skains, MD;  Location: [facility];  Service: Cardiovascular;  Laterality: N/A;    Medications Prior to Admission  Medication Sig Dispense Refill Last Dose   albuterol (VENTOLIN HFA) 108 (90 Base) MCG/ACT inhaler Inhale 2 puffs into the lungs  every 6 (six) hours as needed for wheezing or shortness of breath. 8 g 2 prn at prn   atorvastatin (LIPITOR) 80 MG tablet Take 1 tablet (80 mg total) by mouth daily. 30 tablet 0 10/30/2022 at 1000   Calcium Carb-Cholecalciferol (CALCIUM 500 + D PO) Take 1 tablet by mouth as directed. Take 1 tablet three times a week on Sunday, Wednesday and Friday mornings.   10/30/2022 at 1000   Cholecalciferol 50 MCG (2000 UT) CAPS Take 2,000 Units by mouth daily.   10/30/2022 at 1000   diphenhydrAMINE (BENADRYL) 25 mg capsule Take 25 mg by mouth daily.      ELIQUIS 2.5 MG TABS tablet Take 2.5 mg by mouth 2 (two) times daily.   10/30/2022 at 2200   furosemide (LASIX) 40 MG tablet Take 40 mg by mouth daily.   10/30/2022 at 1000   isosorbide mononitrate (IMDUR) 30 MG 24 hr tablet Take 30 mg by mouth daily.   10/30/2022 at 1000   lisinopril (ZESTRIL) 40 MG tablet Take 40 mg by mouth daily.   10/30/2022 at 1000   Multiple Vitamins-Minerals (VITRUM SENIOR) TABS Take by mouth.   10/30/2022 at 1000   sertraline (ZOLOFT) 50 MG tablet Take 50 mg by mouth daily.   10/30/2022 at 1000   spironolactone (ALDACTONE) 25 MG tablet Take 1 tablet (25 mg total) by mouth daily. 30 tablet 0 10/30/2022 at 1000   aspirin EC 81 MG EC tablet Take 1 tablet (81 mg total) by mouth daily. Swallow whole. (Patient not taking: Reported on 10/31/2022) 30  tablet 0 Not Taking   metoprolol tartrate (LOPRESSOR) 25 MG tablet Take 0.5 tablets (12.5 mg total) by mouth 2 (two) times daily. (Patient not taking: Reported on 10/31/2022) 30 tablet 0 Not Taking   Social History   Socioeconomic History   Marital status: Unknown    Spouse name: Not on file   Number of children: Not on file   Years of education: Not on file   Highest education level: Not on file  Occupational History   Not on file  Tobacco Use   Smoking status: Never   Smokeless tobacco: Never  Substance and Sexual Activity   Alcohol use: Never   Drug use: Never   Sexual activity: Not on file   Other Topics Concern   Not on file  Social History Narrative   Not on file   Social Determinants of Health   Financial Resource Strain: Not on file  Food Insecurity: No Food Insecurity (10/31/2022)   Hunger Vital Sign    Worried About Running Out of Food in the Last Year: Never true    Ran Out of Food in the Last Year: Never true  Transportation Needs: No Transportation Needs (10/31/2022)   PRAPARE - Hydrologist (Medical): No    Lack of Transportation (Non-Medical): No  Physical Activity: Not on file  Stress: Not on file  Social Connections: Not on file  Intimate Partner Violence: Not At Risk (10/31/2022)   Humiliation, Afraid, Rape, and Kick questionnaire    Fear of Current or Ex-Partner: No    Emotionally Abused: No    Physically Abused: No    Sexually Abused: No    Family History  Problem Relation Age of Onset   Colon cancer Mother    Hypertension Mother       Intake/Output Summary (Last 24 hours) at 11/02/2022 0926 Last data filed at 11/02/2022 0800 Gross per 24 hour  Intake 2424.67 ml  Output 250 ml  Net 2174.67 ml    Vitals:   11/02/22 0500 11/02/22 0600 11/02/22 0700 11/02/22 0800  BP: (!) 116/48 (!) 115/47 (!) 120/44 (!) 129/47  Pulse: (!) 49 (!) 52 (!) 56 (!) 54  Resp: (!) 22 (!) 24 (!) 23 (!) 23  Temp: 98.4 F (36.9 C)   98 F (36.7 C)  TempSrc: Oral   Oral  SpO2: 99% 98% 99% 98%  Weight:      Height:        PHYSICAL EXAM General: Pleasant elderly Caucasian female, well nourished, in no acute distress.  Sitting upright in ICU bed HEENT:  Normocephalic and atraumatic.  Somewhat hard of hearing. Neck:  No JVD.  Lungs: Normal respiratory effort on oxygen by nasal cannula.  Decreased breath sounds with crackles bilaterally  heart: Bradycardic but regular. Normal S1 and S2 without gallops or murmurs.  Abdomen: Non-distended appearing.  Msk: Normal strength and tone for age. Extremities: Warm and well perfused. No clubbing,  cyanosis.  No peripheral edema.  Neuro: Alert and oriented X 3. Psych:  Answers questions appropriately.   Labs: Basic Metabolic Panel: Recent Labs    11/01/22 0351 11/01/22 0548 11/02/22 0302  NA 133* 135 133*  K 4.1 4.0 4.4  CL 103 102 104  CO2 21* 25 23  GLUCOSE 118* 111* 108*  BUN 29* 30* 29*  CREATININE 1.53* 1.70* 1.31*  CALCIUM 8.5* 8.4* 8.1*  MG 1.9  --  1.7   Liver Function Tests: No results for input(s): "AST", "ALT", "ALKPHOS", "  BILITOT", "PROT", "ALBUMIN" in the last 72 hours. No results for input(s): "LIPASE", "AMYLASE" in the last 72 hours. CBC: Recent Labs    10/31/22 0149 11/01/22 0351 11/01/22 0548 11/02/22 0302  WBC 9.4   < > 10.6* 8.7  NEUTROABS 6.3  --   --   --   HGB 13.6   < > 10.7* 10.2*  HCT 41.9   < > 32.5* 31.5*  MCV 89.1   < > 88.8 90.0  PLT 290   < > 206 212   < > = values in this interval not displayed.   Cardiac Enzymes: Recent Labs    10/31/22 0358 10/31/22 2100 11/02/22 0752  TROPONINIHS 31* 39* 127*   BNP: Recent Labs    10/31/22 0149  BNP 53.1   D-Dimer: Recent Labs    10/31/22 0149  DDIMER 0.67*   Hemoglobin A1C: No results for input(s): "HGBA1C" in the last 72 hours. Fasting Lipid Panel: No results for input(s): "CHOL", "HDL", "LDLCALC", "TRIG", "CHOLHDL", "LDLDIRECT" in the last 72 hours. Thyroid Function Tests: No results for input(s): "TSH", "T4TOTAL", "T3FREE", "THYROIDAB" in the last 72 hours.  Invalid input(s): "FREET3" Anemia Panel: No results for input(s): "VITAMINB12", "FOLATE", "FERRITIN", "TIBC", "IRON", "RETICCTPCT" in the last 72 hours.   Radiology: CT CHEST ABDOMEN PELVIS WO CONTRAST  Result Date: 11/01/2022 CLINICAL DATA:  Hypotension.  Evaluate for possible aneurysm EXAM: CT CHEST, ABDOMEN AND PELVIS WITHOUT CONTRAST TECHNIQUE: Multidetector CT imaging of the chest, abdomen and pelvis was performed following the standard protocol without IV contrast. RADIATION DOSE REDUCTION: This exam was  performed according to the departmental dose-optimization program which includes automated exposure control, adjustment of the mA and/or kV according to patient size and/or use of iterative reconstruction technique. COMPARISON:  Chest radiograph dated 11/01/2022. FINDINGS: Evaluation of this exam is limited in the absence of intravenous contrast. CT CHEST FINDINGS Cardiovascular: There is no cardiomegaly or pericardial effusion. There is advanced 3 vessel coronary vascular calcification. Moderate atherosclerotic calcification of the thoracic aorta. No aneurysmal dilatation. The central pulmonary arteries are grossly unremarkable on this noncontrast CT. Mediastinum/Nodes: No hilar or mediastinal adenopathy. The esophagus is grossly unremarkable. No mediastinal fluid collection. Lungs/Pleura: Scattered pulmonary nodules measure up to 5 mm in the left upper lobe. Findings may be related to underlying granulomatous disease such as sarcoid or other inflammatory processes. Clinical correlation is recommended. There is a focal area of pleural thickening with associated reticulation in the anterior right middle lobe, likely scarring. There is trace bilateral pleural effusions. No focal consolidation or pneumothorax. The central airways are patent. Musculoskeletal: Osteopenia with degenerative changes of the spine. No acute osseous pathology. CT ABDOMEN PELVIS FINDINGS No intra-abdominal free air or free fluid. Hepatobiliary: The liver is unremarkable. No biliary dilatation. The gallbladder is unremarkable. Pancreas: Mild haziness of the fat adjacent to the head and uncinate process of the pancreas. Correlation with pancreatic enzymes recommended to exclude pancreatitis. No fluid collection or abscess. Spleen: Normal in size without focal abnormality. Adrenals/Urinary Tract: The adrenal glands are unremarkable. Mild bilateral renal parenchyma atrophy. There is no hydronephrosis or nephrolithiasis on either side. There is a 5  cm right renal inferior pole cyst. The visualized ureters and urinary bladder appear unremarkable. Stomach/Bowel: There is severe sigmoid diverticulosis without active inflammatory changes. There is no bowel obstruction or active inflammation. The appendix is not visualized with certainty. No inflammatory changes identified in the right lower quadrant. Vascular/Lymphatic: Advanced aortoiliac atherosclerotic disease. No aneurysmal dilatation. The IVC is unremarkable. No  portal venous gas. There is no adenopathy. Reproductive: Hysterectomy. The right ovary is not identified with certainty. Left ovarian cyst measure up to 2.5 cm. No follow-up imaging recommended. Note: This recommendation does not apply to premenarchal patients and to those with increased risk (genetic, family history, elevated tumor markers or other high-risk factors) of ovarian cancer. Reference: JACR 2020 Feb; 17(2):248-254 Other: None Musculoskeletal: Osteopenia with multilevel degenerative changes of the spine. No acute osseous pathology. IMPRESSION: 1. No aortic aneurysm. 2. Trace bilateral pleural effusions. 3. Scattered pulmonary nodules measure up to 5 mm in the left upper lobe. Findings may be related to underlying granulomatous disease such as sarcoid or other inflammatory processes. Follow-up with chest CT in 6 months recommended. 4. Mild haziness of the fat adjacent to the head and uncinate process of the pancreas. Correlation with pancreatic enzymes recommended to exclude pancreatitis. No fluid collection or abscess. 5. Severe sigmoid diverticulosis without active inflammatory changes. No bowel obstruction. 6.  Aortic Atherosclerosis (ICD10-I70.0). Electronically Signed   By: Anner Crete M.D.   On: 11/01/2022 22:22   DG Chest Port 1 View  Result Date: 11/01/2022 CLINICAL DATA:  Dyspnea EXAM: PORTABLE CHEST 1 VIEW COMPARISON:  10/31/2022 FINDINGS: Cardiac shadow is prominent but stable from the prior exam. Diffuse interstitial  changes are again identified although increased central vascular congestion is now noted. No edema is seen. No bony abnormality is noted. IMPRESSION: New superimposed vascular congestion is now seen overlying the previously seen chronic interstitial changes. Electronically Signed   By: Inez Catalina M.D.   On: 11/01/2022 18:46   [place] EKG SITE RITE  Result Date: 11/01/2022 If Site Rite image not attached, placement could not be confirmed due to current cardiac rhythm.  ECHOCARDIOGRAM COMPLETE  Result Date: 11/01/2022    ECHOCARDIOGRAM REPORT   Patient Name:   KAMBREA SCHEERER Date of Exam: 11/01/2022 Medical Rec #:  [id number]      Height:       61.0 in Accession #:    [id number]     Weight:       114.2 lb Date of Birth:  Jan 17, 1932      BSA:          1.488 m Patient Age:    56 years       BP:           99/45 mmHg Patient Gender: F              HR:           139 bpm. Exam Location:  [facility] Procedure: 2D Echo, Color Doppler, Cardiac Doppler and Intracardiac            Opacification Agent Indications:     Chest Pain R07.9  History:         Patient has prior history of Echocardiogram examinations. DHF,                  Emphysema; Risk Factors:Hypertension.  Sonographer:     L. Thornton-Maynard Referring Phys:  [id number] TINA LAI Diagnosing Phys: Anne Kida MD IMPRESSIONS  1. AFIB during study.  2. Left ventricular ejection fraction, by estimation, is 55 to 60%. The left ventricle has normal function. The left ventricle has no regional wall motion abnormalities. Left ventricular diastolic parameters are consistent with Grade I diastolic dysfunction (impaired relaxation).  3. Right ventricular systolic function is normal. The right ventricular size is normal. There is normal pulmonary artery systolic pressure.  4. The mitral valve is  normal in structure. Trivial mitral valve regurgitation.  5. The aortic valve is normal in structure. Aortic valve regurgitation is not visualized. FINDINGS  Left Ventricle: Left  ventricular ejection fraction, by estimation, is 55 to 60%. The left ventricle has normal function. The left ventricle has no regional wall motion abnormalities. Definity contrast agent was given IV to delineate the left ventricular  endocardial borders. The left ventricular internal cavity size was normal in size. There is borderline concentric left ventricular hypertrophy. Left ventricular diastolic parameters are consistent with Grade I diastolic dysfunction (impaired relaxation). Right Ventricle: The right ventricular size is normal. No increase in right ventricular wall thickness. Right ventricular systolic function is normal. There is normal pulmonary artery systolic pressure. The tricuspid regurgitant velocity is 2.44 m/s, and  with an assumed right atrial pressure of 3 mmHg, the estimated right ventricular systolic pressure is 0000000 mmHg. Left Atrium: Left atrial size was normal in size. Right Atrium: Right atrial size was normal in size. Pericardium: There is no evidence of pericardial effusion. Mitral Valve: The mitral valve is normal in structure. Trivial mitral valve regurgitation. Tricuspid Valve: The tricuspid valve is normal in structure. Tricuspid valve regurgitation is trivial. Aortic Valve: The aortic valve is normal in structure. Aortic valve regurgitation is not visualized. Aortic valve mean gradient measures 4.0 mmHg. Aortic valve peak gradient measures 6.2 mmHg. Aortic valve area, by VTI measures 1.77 cm. Pulmonic Valve: The pulmonic valve was normal in structure. Pulmonic valve regurgitation is not visualized. Aorta: The ascending aorta was not well visualized. IAS/Shunts: No atrial level shunt detected by color flow Doppler. Additional Comments: AFIB during study.  LEFT VENTRICLE PLAX 2D LVIDd:         3.20 cm     Diastology LVIDs:         2.30 cm     LV e' medial:    4.68 cm/s LV PW:         1.00 cm     LV E/e' medial:  14.4 LV IVS:        1.60 cm     LV e' lateral:   5.22 cm/s LVOT diam:      1.70 cm     LV E/e' lateral: 13.0 LV SV:         28 LV SV Index:   19 LVOT Area:     2.27 cm  LV Volumes (MOD) LV vol d, MOD A2C: 21.2 ml LV vol d, MOD A4C: 32.3 ml LV vol s, MOD A2C: 9.6 ml LV vol s, MOD A4C: 9.6 ml LV SV MOD A2C:     11.6 ml LV SV MOD A4C:     32.3 ml LV SV MOD BP:      16.2 ml RIGHT VENTRICLE RV S prime:     9.03 cm/s LEFT ATRIUM             Index        RIGHT ATRIUM          Index LA diam:        3.70 cm 2.49 cm/m   RA Area:     8.51 cm LA Vol (A2C):   32.0 ml 21.50 ml/m  RA Volume:   13.30 ml 8.94 ml/m LA Vol (A4C):   36.6 ml 24.59 ml/m LA Biplane Vol: 34.5 ml 23.18 ml/m  AORTIC VALVE                    PULMONIC VALVE AV Area (Vmax):  1.87 cm     PV Vmax:       1.05 m/s AV Area (Vmean):   1.75 cm     PV Peak grad:  4.4 mmHg AV Area (VTI):     1.77 cm AV Vmax:           124.00 cm/s AV Vmean:          97.050 cm/s AV VTI:            0.160 m AV Peak Grad:      6.2 mmHg AV Mean Grad:      4.0 mmHg LVOT Vmax:         102.20 cm/s LVOT Vmean:        74.750 cm/s LVOT VTI:          0.125 m LVOT/AV VTI ratio: 0.78  AORTA Ao Root diam: 2.90 cm Ao Asc diam:  3.50 cm MITRAL VALVE               TRICUSPID VALVE MV Area (PHT): 11.49 cm   TR Peak grad:   23.8 mmHg MV Decel Time: 66 msec     TR Vmax:        244.00 cm/s MV E velocity: 67.60 cm/s                            SHUNTS                            Systemic VTI:  0.12 m                            Systemic Diam: 1.70 cm Anne Kida MD Electronically signed by Anne Kida MD Signature Date/Time: 11/01/2022/2:30:23 PM    Final    DG Shoulder Right  Result Date: 10/31/2022 CLINICAL DATA:  Right shoulder pain. EXAM: RIGHT SHOULDER - 2+ VIEW COMPARISON:  Chest radiograph dated 10/31/2022. FINDINGS: No acute fracture or dislocation the bones are osteopenic. Mild degenerative changes of the right shoulder. Rounded osseous density over the humeral neck may represent loose bodies. The soft tissues are unremarkable. IMPRESSION: 1. No acute  fracture or dislocation. 2. Mild degenerative changes of the right shoulder. Electronically Signed   By: Anner Crete M.D.   On: 10/31/2022 02:22   DG Chest 2 View  Result Date: 10/31/2022 CLINICAL DATA:  Chest pain. EXAM: CHEST - 2 VIEW COMPARISON:  Chest radiograph dated 09/16/2022. FINDINGS: There is diffuse chronic interstitial coarsening. Mild elevation of the right hemidiaphragm. No focal consolidation, pleural effusion or pneumothorax. The cardiac silhouette is within limits. Osteopenia with degenerative changes of the spine. No acute osseous pathology. IMPRESSION: 1. No acute cardiopulmonary process. 2. Chronic interstitial coarsening. Electronically Signed   By: Anner Crete M.D.   On: 10/31/2022 02:21    [place] 2021 Prox RCA lesion is 60% stenosed. Mid RCA lesion is 30% stenosed. Ost Cx to Prox Cx lesion is 75% stenosed. Prox Cx lesion is 65% stenosed. Prox LAD lesion is 30% stenosed. Mid LAD lesion is 20% stenosed.   89 year old female with known coronary atherosclerosis hypertension hyperlipidemia paroxysmal nonvalvular atrial fibrillation having acute systolic dysfunction congestive heart failure elevated troponin with apical ballooning by echocardiogram consistent with stress-induced cardiomyopathy rather than acute coronary syndrome   Apical ballooning with ejection fraction of 25 to 30%   75% stenosis of ostial  left circumflex artery 60% stenosis of right coronary artery Mild atherosclerosis of left anterior descending artery   Assessment Apical ballooning and/or stress-induced cardiomyopathy with minimal elevation of troponin more consistent with heart failure rather than acute coronary syndrome   Plan Medical management including Lasix beta-blocker spironolactone and ACE inhibitor for cardiomyopathy and congestive heart failure No further cardiac intervention at this time Risk factor modification with high intensity cholesterol therapy Antiplatelet  therapy Rehabilitation  ECHO EF (55-60%, g1DD 10/2022, prev 25-30%, multiple rWMAs 2021)  TELEMETRY reviewed by me (LT) 11/02/2022 : sinus brady rate 48-50s  EKG reviewed by me: AF RVR rate 160s  Data reviewed by me (LT) 11/02/2022: last cardiology clinic note, ED note, admission H&P, critical care progress note, last 24h labs, imaging, vitals tele    Principal Problem:   Unstable angina ([place]) Active Problems:   Essential hypertension   Chronic anticoagulation   Rapid atrial fibrillation (HCC)   Chest pain   Coronary artery disease   Bradyarrhythmia   Atrial fibrillation with rapid ventricular response ([place])    ASSESSMENT AND PLAN:  Anne Mitchell is an 53yoF with a PMH of HF w/ recovered EF (55-60%, g1DD 10/2022, prev 25-30%, multiple rWMAs 2021), hx takotsubo CM, paroxysmal AF (eliquis), CKD 3 who presented to [facility] ED 10/31/22 with right sided chest pain that radiated to her shoulder. She was in AF RVR on presentation, converted to a junctional bradycardia on amiodarone + a 3 second sinus pause with conversion. She had hypotension requiring levophed the PM of 2/18 and intermittent right sided chest discomfort, responsive to NSAIDS.   # paroxysmal AF RVR  # sinus bradycardia Presented with atrial fibrillation with RVR with rates as high as 160s, initially on diltiazem and amiodarone infusions for rate and rhythm control.  Eventually converted to a junctional bradycardia with a ~3 second conversion pause.  Remains in sinus bradycardia with rate high 40s to low 50s on amiodarone 200 mg p.o. twice daily.  No further long pauses or high-grade AV block on telemetry today. -Continue p.o. amiodarone 200 mg twice daily for now, likely discharge on 200 mg once daily -No indication for temporary transvenous pacemaker, or permanent pacemaker at this time -Monitor and replete electrolytes for a K >4, mag >2 -Continue heparin for anticoagulation while inpatient, on dose reduced Eliquis 2.'5mg'$  BID at  home (wt <60kg & age >12)   # non-obstructive CAD + chest pain with typical and atypical features # elevated troponin, likely demand ischemia  Reported chest pressure and "pulsations" on the right side of her chest radiating to her neck and down her shoulder that occurred at rest initially prompting her presentation, in AF RVR on admission as above.  Troponins elevated and trending 31, 39, 127, 103, likely demand/supply mismatch and not ACS without ischemic EKG changes and echo is without RWMA's and actually shows recovered EF compared to 2021.  Recommend Lexiscan Myoview for further evaluation to be performed tomorrow morning, 2/20.  Will make n.p.o. after midnight tonight.  Further recommendations based on results.  # acute on chronic HF  Recovered EF at 55-60% with g1dd, BNP elevated at 700, suspect decompensation in the setting of AF RVR. Remains on supplemental O2.  - consider starting diuresis tomorrow as BP allows - no BB with baseline bradycardia. GDMT reinitiation limited by hypotension. Off levophed entirely 2/19. S/p 2L IVF for hypotension - previously prescribed lisinopril 20 mg every other day, Imdur 30 mg daily, spironolactone 25 mg once daily, and Lasix 40 mg  once daily  # CKD 3  Current renal function with BUN/Cr 29/1.31 and GFR 39, improving after IVF yesterday.   This patient's plan of care was discussed and created with Dr. Saralyn Pilar and he is in agreement.  Signed: Tristan Schroeder , PA-C 11/02/2022, 9:26 AM [facility]

## 2022-11-03 ENCOUNTER — Inpatient Hospital Stay: Payer: Medicare Other

## 2022-11-03 DIAGNOSIS — I209 Angina pectoris, unspecified: Secondary | ICD-10-CM

## 2022-11-03 DIAGNOSIS — I200 Unstable angina: Secondary | ICD-10-CM | POA: Diagnosis not present

## 2022-11-03 DIAGNOSIS — R001 Bradycardia, unspecified: Secondary | ICD-10-CM

## 2022-11-03 LAB — CBC
HCT: 32.4 % — ABNORMAL LOW (ref 36.0–46.0)
Hemoglobin: 10.3 g/dL — ABNORMAL LOW (ref 12.0–15.0)
MCH: 28.9 pg (ref 26.0–34.0)
MCHC: 31.8 g/dL (ref 30.0–36.0)
MCV: 91 fL (ref 80.0–100.0)
Platelets: 225 10*3/uL (ref 150–400)
RBC: 3.56 MIL/uL — ABNORMAL LOW (ref 3.87–5.11)
RDW: 15.1 % (ref 11.5–15.5)
WBC: 7.2 10*3/uL (ref 4.0–10.5)
nRBC: 0 % (ref 0.0–0.2)

## 2022-11-03 LAB — MAGNESIUM: Magnesium: 1.8 mg/dL (ref 1.7–2.4)

## 2022-11-03 LAB — BASIC METABOLIC PANEL
Anion gap: 8 (ref 5–15)
BUN: 19 mg/dL (ref 8–23)
CO2: 24 mmol/L (ref 22–32)
Calcium: 8.6 mg/dL — ABNORMAL LOW (ref 8.9–10.3)
Chloride: 106 mmol/L (ref 98–111)
Creatinine, Ser: 0.89 mg/dL (ref 0.44–1.00)
GFR, Estimated: >60 mL/min (ref >60)
Glucose, Bld: 99 mg/dL (ref 70–99)
Potassium: 4.3 mmol/L (ref 3.5–5.1)
Sodium: 138 mmol/L (ref 135–145)

## 2022-11-03 LAB — MRSA NEXT GEN BY PCR, NASAL: MRSA by PCR Next Gen: NOT DETECTED

## 2022-11-03 LAB — NM MYOCAR SINGLE W/SPECT: Rest Nuclear Isotope Dose: 10.4 mCi

## 2022-11-03 LAB — HEPARIN LEVEL (UNFRACTIONATED): Heparin Unfractionated: 0.32 IU/mL (ref 0.30–0.70)

## 2022-11-03 LAB — LIPOPROTEIN A (LPA): Lipoprotein (a): 31.1 nmol/L — ABNORMAL HIGH (ref <75.0)

## 2022-11-03 MED ORDER — TECHNETIUM TC 99M TETROFOSMIN IV KIT
10.3500 | PACK | Freq: Once | INTRAVENOUS | Status: AC | PRN
  Administered 2022-11-03: 10.35 via INTRAVENOUS

## 2022-11-03 MED ORDER — MAGNESIUM SULFATE 2 GM/50ML IV SOLN
2.0000 g | Freq: Once | INTRAVENOUS | Status: AC
  Administered 2022-11-03: 2 g via INTRAVENOUS
  Filled 2022-11-03: qty 50

## 2022-11-03 MED ORDER — SODIUM CHLORIDE 0.9 % IV SOLN: INTRAVENOUS | Status: DC

## 2022-11-03 MED ORDER — METOPROLOL TARTRATE 25 MG PO TABS
12.5000 mg | ORAL_TABLET | Freq: Four times a day (QID) | ORAL | Status: DC
  Administered 2022-11-03: 12.5 mg via ORAL
  Filled 2022-11-03: qty 1

## 2022-11-03 NOTE — Progress Notes (Signed)
[facility] for Heparin  Indication: chest pain/ACS  Allergies  Allergen Reactions   Other Rash and Swelling    "mycin" meds  Gin  Gin alcohol. Swelling of the genital area   Penicillin G Itching   Banana Swelling   Penicillins Swelling   Sulfa Antibiotics     Patient Measurements: Height: 5' 1"$  (154.9 cm) Weight: 55.3 kg (121 lb 14.6 oz) IBW/kg (Calculated) : 47.8 Heparin Dosing Weight: 51.8 kg   Vital Signs: Temp: 98.2 F (36.8 C) (02/20 0400) Temp Source: Oral (02/20 0400) BP: 117/47 (02/20 0400) Pulse Rate: 51 (02/20 0405)  Labs: Recent Labs    10/31/22 0524 10/31/22 0524 10/31/22 1341 10/31/22 2100 11/01/22 0351 11/01/22 0548 11/02/22 0302 11/02/22 0620 11/02/22 0752 11/02/22 1104 11/03/22 0407  HGB  --    < >  --   --  10.5* 10.7* 10.2*  --   --   --  10.3*  HCT  --    < >  --   --  32.3* 32.5* 31.5*  --   --   --  32.4*  PLT  --    < >  --   --  214 206 212  --   --   --  225  APTT 32  --    < > 85* 78*  --  >200* 91*  --   --   --   LABPROT 15.5*  --   --   --   --   --   --   --   --   --   --   INR 1.2  --   --   --   --   --   --   --   --   --   --   HEPARINUNFRC >1.10*  --   --   --  0.84*  --  0.92* 0.37  --   --  0.32  CREATININE  --    < >  --   --  1.53* 1.70* 1.31*  --   --   --  0.89  TROPONINIHS  --   --   --  39*  --   --   --   --  127* 103*  --    < > = values in this interval not displayed.     Estimated Creatinine Clearance: 33 mL/min (by C-G formula based on SCr of 0.89 mg/dL).   Medical History: Past Medical History:  Diagnosis Date   Diastolic heart failure (HCC)    Emphysema (subcutaneous) (surgical) resulting from a procedure    Not correct   Emphysema lung ([facility])    Hypertension     Medications:  Medications Prior to Admission  Medication Sig Dispense Refill Last Dose   albuterol (VENTOLIN HFA) 108 (90 Base) MCG/ACT inhaler Inhale 2 puffs into the lungs every 6 (six) hours as  needed for wheezing or shortness of breath. 8 g 2 prn at prn   atorvastatin (LIPITOR) 80 MG tablet Take 1 tablet (80 mg total) by mouth daily. 30 tablet 0 10/30/2022 at 1000   Calcium Carb-Cholecalciferol (CALCIUM 500 + D PO) Take 1 tablet by mouth as directed. Take 1 tablet three times a week on Sunday, Wednesday and Friday mornings.   10/30/2022 at 1000   Cholecalciferol 50 MCG (2000 UT) CAPS Take 2,000 Units by mouth daily.   10/30/2022 at 1000   diphenhydrAMINE (BENADRYL) 25 mg capsule Take 25 mg  by mouth daily.      ELIQUIS 2.5 MG TABS tablet Take 2.5 mg by mouth 2 (two) times daily.   10/30/2022 at 2200   furosemide (LASIX) 40 MG tablet Take 40 mg by mouth daily.   10/30/2022 at 1000   isosorbide mononitrate (IMDUR) 30 MG 24 hr tablet Take 30 mg by mouth daily.   10/30/2022 at 1000   lisinopril (ZESTRIL) 40 MG tablet Take 40 mg by mouth daily.   10/30/2022 at 1000   Multiple Vitamins-Minerals (VITRUM SENIOR) TABS Take by mouth.   10/30/2022 at 1000   sertraline (ZOLOFT) 50 MG tablet Take 50 mg by mouth daily.   10/30/2022 at 1000   spironolactone (ALDACTONE) 25 MG tablet Take 1 tablet (25 mg total) by mouth daily. 30 tablet 0 10/30/2022 at 1000   aspirin EC 81 MG EC tablet Take 1 tablet (81 mg total) by mouth daily. Swallow whole. (Patient not taking: Reported on 10/31/2022) 30 tablet 0 Not Taking   metoprolol tartrate (LOPRESSOR) 25 MG tablet Take 0.5 tablets (12.5 mg total) by mouth 2 (two) times daily. (Patient not taking: Reported on 10/31/2022) 30 tablet 0 Not Taking    Assessment: Pharmacy consulted to dose heparin in this 89 year old female admitted with ACS/NSTEMI.  Pt was on Eliquis 2.5 mg PO BID, last dose on 2/16 @ 2200.  CrCl = 28.2 ml/min  2/17 1341 aPTT=73 Therapeutic x1 2/17 2100 aPTT=85 Therapeutic x 2 2/18 0351 aPTT=78,  HL = 0.84 2/19 0302 aPTT=> 200,  HL = 0.91 (suspect lab error) 2/19 0620 aPTT=91, HL = 0.37, therapeutic X 4 2/20 0407 HL 0.32, therapeutic x 5  Goal of  Therapy:  aPTT 66-102 Heparin level 0.3-0.7 units/ml Monitor platelets by anticoagulation protocol: Yes   Plan:  - Continue heparin infusion at 600 units/hr  - HL daily w/ AM labs while therapeutic - CBC daily while on heparin  Renda Rolls, PharmD, [facility] 11/03/2022 5:23 AM

## 2022-11-03 NOTE — Progress Notes (Addendum)
Anne Mitchell NOTE       Patient ID: Anne Mitchell MRN: [id number] DOB/AGE: 1932/06/19 89 y.o.  Admit date: 10/31/2022 Referring Physician Dr. Judd Gaudier Primary Physician Dr. Kym Groom Primary Cardiologist Dr. Nehemiah Massed Reason for Consultation chest pain, bradycardia  HPI: Anne Mitchell is an 56yoF with a PMH of HF w/ recovered EF (55-60%, g1DD 10/2022, prev 25-30%, multiple rWMAs 2021), hx takotsubo CM, paroxysmal AF (eliquis), CKD 3 who presented to [facility] ED 10/31/22 with right sided chest pain that radiated to her shoulder. She was in AF RVR on presentation, converted to a junctional bradycardia on amiodarone + a 3 second sinus pause with conversion. She had hypotension requiring levophed the PM of 2/18 and intermittent right sided chest discomfort, responsive to NSAIDS. Continued paroxysms of AF RVR + 2-3 second conversion pauses that she is symptomatic from. Plan for Micra PPM on Thursday 2/22.   Interval History: - seen early AM in ICU - feels "almost normal" today - no chest pain, shortness of breath, palpitations, or dizziness - converted to AF RVR this am, rates 110-130s.   Seen and examined again prior to stress test in cardiopulmonary - converted from AF RVR to junctional bradycardia with HR 35-42bpm with multiple 2-3 second pauses after conversion. Symptomatic with lightheadedness & "wooziness." BP low normal 100/53. Stress test cancelled.   Review of systems complete and found to be negative unless listed above     Past Medical History:  Diagnosis Date   Diastolic heart failure (HCC)    Emphysema (subcutaneous) (surgical) resulting from a procedure    Not correct   Emphysema lung ([place])    Hypertension     Past Surgical History:  Procedure Laterality Date   LEFT HEART CATH AND CORONARY ANGIOGRAPHY N/A 03/22/2020   Procedure: LEFT HEART CATH AND CORONARY ANGIOGRAPHY;  Surgeon: Corey Skains, MD;  Location: [place] [facility];  Service:  Cardiovascular;  Laterality: N/A;    Medications Prior to Admission  Medication Sig Dispense Refill Last Dose   albuterol (VENTOLIN HFA) 108 (90 Base) MCG/ACT inhaler Inhale 2 puffs into the lungs every 6 (six) hours as needed for wheezing or shortness of breath. 8 g 2 prn at prn   atorvastatin (LIPITOR) 80 MG tablet Take 1 tablet (80 mg total) by mouth daily. 30 tablet 0 10/30/2022 at 1000   Calcium Carb-Cholecalciferol (CALCIUM 500 + D PO) Take 1 tablet by mouth as directed. Take 1 tablet three times a week on Sunday, Wednesday and Friday mornings.   10/30/2022 at 1000   Cholecalciferol 50 MCG (2000 UT) CAPS Take 2,000 Units by mouth daily.   10/30/2022 at 1000   diphenhydrAMINE (BENADRYL) 25 mg capsule Take 25 mg by mouth daily.      ELIQUIS 2.5 MG TABS tablet Take 2.5 mg by mouth 2 (two) times daily.   10/30/2022 at 2200   furosemide (LASIX) 40 MG tablet Take 40 mg by mouth daily.   10/30/2022 at 1000   isosorbide mononitrate (IMDUR) 30 MG 24 hr tablet Take 30 mg by mouth daily.   10/30/2022 at 1000   lisinopril (ZESTRIL) 40 MG tablet Take 40 mg by mouth daily.   10/30/2022 at 1000   Multiple Vitamins-Minerals (VITRUM SENIOR) TABS Take by mouth.   10/30/2022 at 1000   sertraline (ZOLOFT) 50 MG tablet Take 50 mg by mouth daily.   10/30/2022 at 1000   spironolactone (ALDACTONE) 25 MG tablet Take 1 tablet (25 mg total) by mouth daily. 30 tablet  0 10/30/2022 at 1000   aspirin EC 81 MG EC tablet Take 1 tablet (81 mg total) by mouth daily. Swallow whole. (Patient not taking: Reported on 10/31/2022) 30 tablet 0 Not Taking   metoprolol tartrate (LOPRESSOR) 25 MG tablet Take 0.5 tablets (12.5 mg total) by mouth 2 (two) times daily. (Patient not taking: Reported on 10/31/2022) 30 tablet 0 Not Taking   Social History   Socioeconomic History   Marital status: Unknown    Spouse name: Not on file   Number of children: Not on file   Years of education: Not on file   Highest education level: Not on file   Occupational History   Not on file  Tobacco Use   Smoking status: Never   Smokeless tobacco: Never  Substance and Sexual Activity   Alcohol use: Never   Drug use: Never   Sexual activity: Not on file  Other Topics Concern   Not on file  Social History Narrative   Not on file   Social Determinants of Health   Financial Resource Strain: Not on file  Food Insecurity: No Food Insecurity (10/31/2022)   Hunger Vital Sign    Worried About Running Out of Food in the Last Year: Never true    Ran Out of Food in the Last Year: Never true  Transportation Needs: No Transportation Needs (10/31/2022)   PRAPARE - Hydrologist (Medical): No    Lack of Transportation (Non-Medical): No  Physical Activity: Not on file  Stress: Not on file  Social Connections: Not on file  Intimate Partner Violence: Not At Risk (10/31/2022)   Humiliation, Afraid, Rape, and Kick questionnaire    Fear of Current or Ex-Partner: No    Emotionally Abused: No    Physically Abused: No    Sexually Abused: No    Family History  Problem Relation Age of Onset   Colon cancer Mother    Hypertension Mother       Intake/Output Summary (Last 24 hours) at 11/03/2022 0834 Last data filed at 11/03/2022 0600 Gross per 24 hour  Intake 269.32 ml  Output 600 ml  Net -330.68 ml     Vitals:   11/03/22 0600 11/03/22 0700 11/03/22 0800 11/03/22 0826  BP: 110/60 105/64 125/77 130/76  Pulse: (!) 47 (!) 44 (!) 28   Resp: '18 20 18   '$ Temp:   98.5 F (36.9 C)   TempSrc:      SpO2: 99% 99% 96%   Weight:      Height:        PHYSICAL EXAM General: Pleasant elderly Caucasian female, well nourished, in no acute distress. Laying nearly flat in ICU bed HEENT:  Normocephalic and atraumatic.  Somewhat hard of hearing. Neck:  No JVD.  Lungs: Normal respiratory effort on oxygen by nasal cannula.  Decreased breath sounds with crackles bilaterally  heart: tachy irregularly irregular. Normal S1 and S2  without gallops or murmurs.  Abdomen: Non-distended appearing.  Msk: Normal strength and tone for age. Extremities: Warm and well perfused. No clubbing, cyanosis.  No peripheral edema.  Neuro: Alert and oriented X 3. Psych:  Answers questions appropriately.   Labs: Basic Metabolic Panel: Recent Labs    11/02/22 0302 11/03/22 0407  NA 133* 138  K 4.4 4.3  CL 104 106  CO2 23 24  GLUCOSE 108* 99  BUN 29* 19  CREATININE 1.31* 0.89  CALCIUM 8.1* 8.6*  MG 1.7 1.8    Liver Function Tests:  No results for input(s): "AST", "ALT", "ALKPHOS", "BILITOT", "PROT", "ALBUMIN" in the last 72 hours. No results for input(s): "LIPASE", "AMYLASE" in the last 72 hours. CBC: Recent Labs    11/02/22 0302 11/03/22 0407  WBC 8.7 7.2  HGB 10.2* 10.3*  HCT 31.5* 32.4*  MCV 90.0 91.0  PLT 212 225    Cardiac Enzymes: Recent Labs    10/31/22 2100 11/02/22 0752 11/02/22 1104  TROPONINIHS 39* 127* 103*    BNP: Recent Labs    11/02/22 1104  BNP 771.6*    D-Dimer: No results for input(s): "DDIMER" in the last 72 hours.  Hemoglobin A1C: No results for input(s): "HGBA1C" in the last 72 hours. Fasting Lipid Panel: No results for input(s): "CHOL", "HDL", "LDLCALC", "TRIG", "CHOLHDL", "LDLDIRECT" in the last 72 hours. Thyroid Function Tests: No results for input(s): "TSH", "T4TOTAL", "T3FREE", "THYROIDAB" in the last 72 hours.  Invalid input(s): "FREET3" Anemia Panel: No results for input(s): "VITAMINB12", "FOLATE", "FERRITIN", "TIBC", "IRON", "RETICCTPCT" in the last 72 hours.   Radiology: CT CHEST ABDOMEN PELVIS WO CONTRAST  Result Date: 11/01/2022 CLINICAL DATA:  Hypotension.  Evaluate for possible aneurysm EXAM: CT CHEST, ABDOMEN AND PELVIS WITHOUT CONTRAST TECHNIQUE: Multidetector CT imaging of the chest, abdomen and pelvis was performed following the standard protocol without IV contrast. RADIATION DOSE REDUCTION: This exam was performed according to the departmental  dose-optimization program which includes automated exposure control, adjustment of the mA and/or kV according to patient size and/or use of iterative reconstruction technique. COMPARISON:  Chest radiograph dated 11/01/2022. FINDINGS: Evaluation of this exam is limited in the absence of intravenous contrast. CT CHEST FINDINGS Cardiovascular: There is no cardiomegaly or pericardial effusion. There is advanced 3 vessel coronary vascular calcification. Moderate atherosclerotic calcification of the thoracic aorta. No aneurysmal dilatation. The central pulmonary arteries are grossly unremarkable on this noncontrast CT. Mediastinum/Nodes: No hilar or mediastinal adenopathy. The esophagus is grossly unremarkable. No mediastinal fluid collection. Lungs/Pleura: Scattered pulmonary nodules measure up to 5 mm in the left upper lobe. Findings may be related to underlying granulomatous disease such as sarcoid or other inflammatory processes. Clinical correlation is recommended. There is a focal area of pleural thickening with associated reticulation in the anterior right middle lobe, likely scarring. There is trace bilateral pleural effusions. No focal consolidation or pneumothorax. The central airways are patent. Musculoskeletal: Osteopenia with degenerative changes of the spine. No acute osseous pathology. CT ABDOMEN PELVIS FINDINGS No intra-abdominal free air or free fluid. Hepatobiliary: The liver is unremarkable. No biliary dilatation. The gallbladder is unremarkable. Pancreas: Mild haziness of the fat adjacent to the head and uncinate process of the pancreas. Correlation with pancreatic enzymes recommended to exclude pancreatitis. No fluid collection or abscess. Spleen: Normal in size without focal abnormality. Adrenals/Urinary Tract: The adrenal glands are unremarkable. Mild bilateral renal parenchyma atrophy. There is no hydronephrosis or nephrolithiasis on either side. There is a 5 cm right renal inferior pole cyst. The  visualized ureters and urinary bladder appear unremarkable. Stomach/Bowel: There is severe sigmoid diverticulosis without active inflammatory changes. There is no bowel obstruction or active inflammation. The appendix is not visualized with certainty. No inflammatory changes identified in the right lower quadrant. Vascular/Lymphatic: Advanced aortoiliac atherosclerotic disease. No aneurysmal dilatation. The IVC is unremarkable. No portal venous gas. There is no adenopathy. Reproductive: Hysterectomy. The right ovary is not identified with certainty. Left ovarian cyst measure up to 2.5 cm. No follow-up imaging recommended. Note: This recommendation does not apply to premenarchal patients and to those with increased  risk (genetic, family history, elevated tumor markers or other high-risk factors) of ovarian cancer. Reference: JACR 2020 Feb; 17(2):248-254 Other: None Musculoskeletal: Osteopenia with multilevel degenerative changes of the spine. No acute osseous pathology. IMPRESSION: 1. No aortic aneurysm. 2. Trace bilateral pleural effusions. 3. Scattered pulmonary nodules measure up to 5 mm in the left upper lobe. Findings may be related to underlying granulomatous disease such as sarcoid or other inflammatory processes. Follow-up with chest CT in 6 months recommended. 4. Mild haziness of the fat adjacent to the head and uncinate process of the pancreas. Correlation with pancreatic enzymes recommended to exclude pancreatitis. No fluid collection or abscess. 5. Severe sigmoid diverticulosis without active inflammatory changes. No bowel obstruction. 6.  Aortic Atherosclerosis (ICD10-I70.0). Electronically Signed   By: Anner Crete M.D.   On: 11/01/2022 22:22   DG Chest Port 1 View  Result Date: 11/01/2022 CLINICAL DATA:  Dyspnea EXAM: PORTABLE CHEST 1 VIEW COMPARISON:  10/31/2022 FINDINGS: Cardiac shadow is prominent but stable from the prior exam. Diffuse interstitial changes are again identified although  increased central vascular congestion is now noted. No edema is seen. No bony abnormality is noted. IMPRESSION: New superimposed vascular congestion is now seen overlying the previously seen chronic interstitial changes. Electronically Signed   By: Inez Catalina M.D.   On: 11/01/2022 18:46   [place] EKG SITE RITE  Result Date: 11/01/2022 If Site Rite image not attached, placement could not be confirmed due to current cardiac rhythm.  ECHOCARDIOGRAM COMPLETE  Result Date: 11/01/2022    ECHOCARDIOGRAM REPORT   Patient Name:   NASHARA PIERSOL Date of Exam: 11/01/2022 Medical Rec #:  [id number]      Height:       61.0 in Accession #:    [id number]     Weight:       114.2 lb Date of Birth:  1932-07-16      BSA:          1.488 m Patient Age:    16 years       BP:           99/45 mmHg Patient Gender: F              HR:           139 bpm. Exam Location:  [facility] Procedure: 2D Echo, Color Doppler, Cardiac Doppler and Intracardiac            Opacification Agent Indications:     Chest Pain R07.9  History:         Patient has prior history of Echocardiogram examinations. DHF,                  Emphysema; Risk Factors:Hypertension.  Sonographer:     L. Thornton-Maynard Referring Phys:  [id number] TINA LAI Diagnosing Phys: Yolonda Kida MD IMPRESSIONS  1. AFIB during study.  2. Left ventricular ejection fraction, by estimation, is 55 to 60%. The left ventricle has normal function. The left ventricle has no regional wall motion abnormalities. Left ventricular diastolic parameters are consistent with Grade I diastolic dysfunction (impaired relaxation).  3. Right ventricular systolic function is normal. The right ventricular size is normal. There is normal pulmonary artery systolic pressure.  4. The mitral valve is normal in structure. Trivial mitral valve regurgitation.  5. The aortic valve is normal in structure. Aortic valve regurgitation is not visualized. FINDINGS  Left Ventricle: Left ventricular ejection fraction, by estimation,  is 55 to 60%. The left ventricle has normal function.  The left ventricle has no regional wall motion abnormalities. Definity contrast agent was given IV to delineate the left ventricular  endocardial borders. The left ventricular internal cavity size was normal in size. There is borderline concentric left ventricular hypertrophy. Left ventricular diastolic parameters are consistent with Grade I diastolic dysfunction (impaired relaxation). Right Ventricle: The right ventricular size is normal. No increase in right ventricular wall thickness. Right ventricular systolic function is normal. There is normal pulmonary artery systolic pressure. The tricuspid regurgitant velocity is 2.44 m/s, and  with an assumed right atrial pressure of 3 mmHg, the estimated right ventricular systolic pressure is 0000000 mmHg. Left Atrium: Left atrial size was normal in size. Right Atrium: Right atrial size was normal in size. Pericardium: There is no evidence of pericardial effusion. Mitral Valve: The mitral valve is normal in structure. Trivial mitral valve regurgitation. Tricuspid Valve: The tricuspid valve is normal in structure. Tricuspid valve regurgitation is trivial. Aortic Valve: The aortic valve is normal in structure. Aortic valve regurgitation is not visualized. Aortic valve mean gradient measures 4.0 mmHg. Aortic valve peak gradient measures 6.2 mmHg. Aortic valve area, by VTI measures 1.77 cm. Pulmonic Valve: The pulmonic valve was normal in structure. Pulmonic valve regurgitation is not visualized. Aorta: The ascending aorta was not well visualized. IAS/Shunts: No atrial level shunt detected by color flow Doppler. Additional Comments: AFIB during study.  LEFT VENTRICLE PLAX 2D LVIDd:         3.20 cm     Diastology LVIDs:         2.30 cm     LV e' medial:    4.68 cm/s LV PW:         1.00 cm     LV E/e' medial:  14.4 LV IVS:        1.60 cm     LV e' lateral:   5.22 cm/s LVOT diam:     1.70 cm     LV E/e' lateral: 13.0 LV SV:          28 LV SV Index:   19 LVOT Area:     2.27 cm  LV Volumes (MOD) LV vol d, MOD A2C: 21.2 ml LV vol d, MOD A4C: 32.3 ml LV vol s, MOD A2C: 9.6 ml LV vol s, MOD A4C: 9.6 ml LV SV MOD A2C:     11.6 ml LV SV MOD A4C:     32.3 ml LV SV MOD BP:      16.2 ml RIGHT VENTRICLE RV S prime:     9.03 cm/s LEFT ATRIUM             Index        RIGHT ATRIUM          Index LA diam:        3.70 cm 2.49 cm/m   RA Area:     8.51 cm LA Vol (A2C):   32.0 ml 21.50 ml/m  RA Volume:   13.30 ml 8.94 ml/m LA Vol (A4C):   36.6 ml 24.59 ml/m LA Biplane Vol: 34.5 ml 23.18 ml/m  AORTIC VALVE                    PULMONIC VALVE AV Area (Vmax):    1.87 cm     PV Vmax:       1.05 m/s AV Area (Vmean):   1.75 cm     PV Peak grad:  4.4 mmHg AV Area (VTI):  1.77 cm AV Vmax:           124.00 cm/s AV Vmean:          97.050 cm/s AV VTI:            0.160 m AV Peak Grad:      6.2 mmHg AV Mean Grad:      4.0 mmHg LVOT Vmax:         102.20 cm/s LVOT Vmean:        74.750 cm/s LVOT VTI:          0.125 m LVOT/AV VTI ratio: 0.78  AORTA Ao Root diam: 2.90 cm Ao Asc diam:  3.50 cm MITRAL VALVE               TRICUSPID VALVE MV Area (PHT): 11.49 cm   TR Peak grad:   23.8 mmHg MV Decel Time: 66 msec     TR Vmax:        244.00 cm/s MV E velocity: 67.60 cm/s                            SHUNTS                            Systemic VTI:  0.12 m                            Systemic Diam: 1.70 cm Yolonda Kida MD Electronically signed by Yolonda Kida MD Signature Date/Time: 11/01/2022/2:30:23 PM    Final    DG Shoulder Right  Result Date: 10/31/2022 CLINICAL DATA:  Right shoulder pain. EXAM: RIGHT SHOULDER - 2+ VIEW COMPARISON:  Chest radiograph dated 10/31/2022. FINDINGS: No acute fracture or dislocation the bones are osteopenic. Mild degenerative changes of the right shoulder. Rounded osseous density over the humeral neck may represent loose bodies. The soft tissues are unremarkable. IMPRESSION: 1. No acute fracture or dislocation. 2. Mild degenerative  changes of the right shoulder. Electronically Signed   By: Anner Crete M.D.   On: 10/31/2022 02:22   DG Chest 2 View  Result Date: 10/31/2022 CLINICAL DATA:  Chest pain. EXAM: CHEST - 2 VIEW COMPARISON:  Chest radiograph dated 09/16/2022. FINDINGS: There is diffuse chronic interstitial coarsening. Mild elevation of the right hemidiaphragm. No focal consolidation, pleural effusion or pneumothorax. The cardiac silhouette is within limits. Osteopenia with degenerative changes of the spine. No acute osseous pathology. IMPRESSION: 1. No acute cardiopulmonary process. 2. Chronic interstitial coarsening. Electronically Signed   By: Anner Crete M.D.   On: 10/31/2022 02:21    [place] 2021 Prox RCA lesion is 60% stenosed. Mid RCA lesion is 30% stenosed. Ost Cx to Prox Cx lesion is 75% stenosed. Prox Cx lesion is 65% stenosed. Prox LAD lesion is 30% stenosed. Mid LAD lesion is 20% stenosed.   89 year old female with known coronary atherosclerosis hypertension hyperlipidemia paroxysmal nonvalvular atrial fibrillation having acute systolic dysfunction congestive heart failure elevated troponin with apical ballooning by echocardiogram consistent with stress-induced cardiomyopathy rather than acute coronary syndrome   Apical ballooning with ejection fraction of 25 to 30%   75% stenosis of ostial left circumflex artery 60% stenosis of right coronary artery Mild atherosclerosis of left anterior descending artery   Assessment Apical ballooning and/or stress-induced cardiomyopathy with minimal elevation of troponin more consistent with heart failure rather than acute coronary syndrome  Plan Medical management including Lasix beta-blocker spironolactone and ACE inhibitor for cardiomyopathy and congestive heart failure No further cardiac intervention at this time Risk factor modification with high intensity cholesterol therapy Antiplatelet therapy Rehabilitation  ECHO EF (55-60%, g1DD 10/2022, prev  25-30%, multiple rWMAs 2021)  TELEMETRY reviewed by me (LT) 11/03/2022 : sinus brady with intermittent junctional beats, rates high 30s-mid 40s overnight, now AF RVR rate 110-130s  EKG reviewed by me: AF RVR rate 160s  Data reviewed by me (LT) 11/03/2022:  critical care progress note, last 24h labs, imaging, vitals tele    Principal Problem:   Unstable angina ([place]) Active Problems:   Essential hypertension   Chronic anticoagulation   Rapid atrial fibrillation (HCC)   Chest pain   Coronary artery disease   Bradyarrhythmia   Atrial fibrillation with rapid ventricular response ([place])    ASSESSMENT AND PLAN:  Anne Mitchell is an 9yoF with a PMH of HF w/ recovered EF (55-60%, g1DD 10/2022, prev 25-30%, multiple rWMAs 2021), hx takotsubo CM, paroxysmal AF (eliquis), CKD 3 who presented to [facility] ED 10/31/22 with right sided chest pain that radiated to her shoulder. She was in AF RVR on presentation, converted to a junctional bradycardia on amiodarone + a 3 second sinus pause with conversion. She had hypotension requiring levophed the PM of 2/18 and intermittent right sided chest discomfort, responsive to NSAIDS. Continued paroxysms of AF RVR + 2-3 second conversion pauses that she is symptomatic from. Plan for Micra PPM on Thursday 2/22.   # paroxysmal AF RVR  # symptomatic bradycardia Presented with atrial fibrillation with RVR with rates as high as 160s, initially on diltiazem and amiodarone infusions for rate and rhythm control.  Eventually converted to a junctional bradycardia with a ~3 second conversion pause.  Back to AF RVR in AM 2/20 + symptomatic following conversion to sinus / intermittent junctional brady for which PPM is recommended.  -s/p metoprolol tartrate 12.'5mg'$  x1 for rate control while in AF. Hold further doses. -Continue p.o. amiodarone 200 mg twice daily for now, likely discharge on 200 mg once daily -No indication for temporary transvenous pacemaker at this time -Monitor and  replete electrolytes for a K >4, mag >2 -Continue heparin for anticoagulation while inpatient, on dose reduced Eliquis 2.'5mg'$  BID at home (wt <60kg & age >14)  -Discussed the risks and benefits of proceeding with Med[employer]onic Micra leadless PPM vs traditional DC PPM with the patient & the patient's daughter by phone. The patient and daughter are agreeable to proceed with Micra Leadless PPM on 2/22 at 8:30am with Dr. Saralyn Pilar. Written consent will be obtained. NPO after midnight except for sips with meds on 2/22.   # non-obstructive CAD + chest pain with typical and atypical features # demand ischemia  Reported chest pressure and "pulsations" on the right side of her chest radiating to her neck and down her shoulder that occurred at rest initially prompting her presentation, in AF RVR on admission as above. Some response to NSAIDS. Troponins elevated and trending 31, 39, 127, 103, likely demand/supply mismatch and not ACS without ischemic EKG changes and echo is without RWMA's and actually shows recovered EF compared to 2021.   -cancelled Lexiscan Myoview w/ symptomatic bradycardia + hypotension after conversion from AF RVR to junctional brady as above.   # acute on chronic HF  Recovered EF at 55-60% with g1dd, BNP elevated at 700, suspect decompensation in the setting of AF RVR. Remains on supplemental O2.  - hold off on diuresis  for now.  - no BB with baseline bradycardia. GDMT reinitiation limited by hypotension. Off levophed entirely since 2/19. S/p 2L IVF for hypotension - previously prescribed lisinopril 20 mg every other day, Imdur 30 mg daily, spironolactone 25 mg once daily, and Lasix 40 mg once daily  # CKD 3  Renal function normalized following IVF.   This patient's plan of care was discussed and created with Dr. Saralyn Pilar and he is in agreement.  Signed: Tristan Schroeder , PA-C 11/03/2022, 8:34 AM [facility]

## 2022-11-03 NOTE — Progress Notes (Signed)
PROGRESS NOTE    Anne Mitchell  [id number] DOB: Dec 05, 1931 DOA: 10/31/2022 PCP: Valera Castle, MD  [id number]  LOS: 2 days   Brief hospital course:   Assessment & Plan: Anne Mitchell is a 89 y.o. 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    On arrival of EMS heart rate was in the 140s and she was in A-fib.   Afib w RVR Junctional bradycardia  Patient converted to junctional bradycardia in the 50s after diltiazem bolus and infusion.  Rate control agents held.  Pt went into Afib RVR again morning of 2/18, with BP dropping.  Pt then was started amiodarone bolus+gtt. --cardiology consulted Plan: --cont amiodarone as oral 200 mg BID --cont heparin gtt for stroke ppx --plan for pacemaker placement on 2/22.  Hypotension --developed hypotension morning of 2/18 with BP down in 70's, due to Afib RVR.  Received 2L IVF and on pressor briefly.  Chest pain --seems atypical, right-sided, seemed to improve with IV toradol --completed 72 hours of heparin gtt. --stress test was not completed due to symptomatic bradycardia --cont Imdur  --IV toradol PRN for pain  Trop elevation 2/2 demand ischemia --Likely demand from RVR. --further workup per cardio  CAD, history of NSTEMI 2021 History of stress cardiomyopathy 2021 Patient with history of multiple vessel but nonobstructive CAD on cath 2021 --cont statin --further workup per cardio  AKI CKD 3 ruled out --Cr 1.04 on presentation, up to 1.7  --AKI resolved with IVF  Acute on chronic diastolic CHF --Recovered EF at 55-60% with g1dd, BNP elevated at 700, suspect decompensation in the setting of AF RVR and also having received  2L IVF while hypotensive.  CXR showed New superimposed vascular congestion. --hold diuresis now due to hypotension   DVT prophylaxis: [id number] gtt Code Status: Full code  Family Communication:  Level of care: Progressive Dispo:   The patient is from: home Anticipated d/c is to: home Anticipated d/c date is: friday   Subjective and Interval History:  Pt reported feeling better after a good night sleep.  No significant dyspnea.   Objective: Vitals:   11/03/22 1200 11/03/22 1300 11/03/22 1400 11/03/22 1500  BP: (!) 90/43 111/63 115/68 (!) 106/48  Pulse: (!) 40 (!) 43 (!) 40 (!) 43  Resp: 16 (!) 23 (!) 23 (!) 28  Temp: 98 F (36.7 C)     TempSrc: Oral     SpO2: 100% 99% 99% 100%  Weight:      Height:        Intake/Output Summary (Last 24 hours) at 11/03/2022 1546 Last data filed at 11/03/2022 1500 Gross per 24 hour  Intake 233.33 ml  Output 1050 ml  Net -816.67 ml   Filed Weights   10/31/22 0142 11/02/22 0136 11/03/22 0405  Weight: 51.8 kg 51.8 kg 55.3 kg    Examination:   Constitutional: NAD, AAOx3 HEENT: conjunctivae and lids normal, EOMI CV: No cyanosis.   RESP: normal respiratory effort, on 2L Neuro: II - XII grossly intact.   Psych: Normal mood and affect.  Appropriate judgement and reason   Data Reviewed: I have personally review ed labs and imaging studies  Time spent: 50 minutes   Enzo Bi, MD [facility] If 7PM-7AM,  please contact night-coverage 11/03/2022, 3:46 PM

## 2022-11-04 ENCOUNTER — Inpatient Hospital Stay: Payer: Medicare Other

## 2022-11-04 DIAGNOSIS — I200 Unstable angina: Secondary | ICD-10-CM | POA: Diagnosis not present

## 2022-11-04 LAB — HEPARIN LEVEL (UNFRACTIONATED)
Heparin Unfractionated: 0.18 IU/mL — ABNORMAL LOW (ref 0.30–0.70)
Heparin Unfractionated: 0.38 IU/mL (ref 0.30–0.70)
Heparin Unfractionated: 0.39 IU/mL (ref 0.30–0.70)

## 2022-11-04 LAB — CBC
HCT: 30.9 % — ABNORMAL LOW (ref 36.0–46.0)
HCT: 33 % — ABNORMAL LOW (ref 36.0–46.0)
Hemoglobin: 9.9 g/dL — ABNORMAL LOW (ref 12.0–15.0)
Hemoglobin: 10.8 g/dL — ABNORMAL LOW (ref 12.0–15.0)
MCH: 28.8 pg (ref 26.0–34.0)
MCH: 29.1 pg (ref 26.0–34.0)
MCHC: 32 g/dL (ref 30.0–36.0)
MCHC: 32.7 g/dL (ref 30.0–36.0)
MCV: 89.8 fL (ref 80.0–100.0)
MCV: 88.9 fL (ref 80.0–100.0)
Platelets: 226 10*3/uL (ref 150–400)
Platelets: 235 10*3/uL (ref 150–400)
RBC: 3.44 MIL/uL — ABNORMAL LOW (ref 3.87–5.11)
RBC: 3.71 MIL/uL — ABNORMAL LOW (ref 3.87–5.11)
RDW: 14.9 % (ref 11.5–15.5)
RDW: 14.9 % (ref 11.5–15.5)
WBC: 8 10*3/uL (ref 4.0–10.5)
WBC: 7.1 10*3/uL (ref 4.0–10.5)
nRBC: 0 % (ref 0.0–0.2)
nRBC: 0 % (ref 0.0–0.2)

## 2022-11-04 LAB — BASIC METABOLIC PANEL
Anion gap: 7 (ref 5–15)
Anion gap: 8 (ref 5–15)
BUN: 17 mg/dL (ref 8–23)
BUN: 18 mg/dL (ref 8–23)
CO2: 22 mmol/L (ref 22–32)
CO2: 27 mmol/L (ref 22–32)
Calcium: 8.6 mg/dL — ABNORMAL LOW (ref 8.9–10.3)
Calcium: 8.8 mg/dL — ABNORMAL LOW (ref 8.9–10.3)
Chloride: 105 mmol/L (ref 98–111)
Chloride: 99 mmol/L (ref 98–111)
Creatinine, Ser: 0.94 mg/dL (ref 0.44–1.00)
Creatinine, Ser: 1.1 mg/dL — ABNORMAL HIGH (ref 0.44–1.00)
GFR, Estimated: 58 mL/min — ABNORMAL LOW (ref >60)
GFR, Estimated: 48 mL/min — ABNORMAL LOW (ref >60)
Glucose, Bld: 102 mg/dL — ABNORMAL HIGH (ref 70–99)
Glucose, Bld: 135 mg/dL — ABNORMAL HIGH (ref 70–99)
Potassium: 4.6 mmol/L (ref 3.5–5.1)
Potassium: 4.3 mmol/L (ref 3.5–5.1)
Sodium: 134 mmol/L — ABNORMAL LOW (ref 135–145)
Sodium: 134 mmol/L — ABNORMAL LOW (ref 135–145)

## 2022-11-04 LAB — MAGNESIUM
Magnesium: 2.2 mg/dL (ref 1.7–2.4)
Magnesium: 2 mg/dL (ref 1.7–2.4)

## 2022-11-04 MED ORDER — HEPARIN BOLUS VIA INFUSION
750.0000 [IU] | Freq: Once | INTRAVENOUS | Status: AC
  Administered 2022-11-04: 750 [IU] via INTRAVENOUS
  Filled 2022-11-04: qty 750

## 2022-11-04 MED ORDER — FUROSEMIDE 10 MG/ML IJ SOLN
20.0000 mg | Freq: Once | INTRAMUSCULAR | Status: AC
  Administered 2022-11-04: 20 mg via INTRAVENOUS
  Filled 2022-11-04: qty 2

## 2022-11-04 MED ORDER — FUROSEMIDE 10 MG/ML IJ SOLN
40.0000 mg | Freq: Once | INTRAMUSCULAR | Status: AC
  Administered 2022-11-04: 40 mg via INTRAVENOUS
  Filled 2022-11-04: qty 4

## 2022-11-04 MED ORDER — LEVALBUTEROL HCL 0.63 MG/3ML IN NEBU
0.6300 mg | INHALATION_SOLUTION | Freq: Once | RESPIRATORY_TRACT | Status: AC
  Administered 2022-11-04: 0.63 mg via RESPIRATORY_TRACT
  Filled 2022-11-04: qty 3

## 2022-11-04 MED ORDER — GABAPENTIN 100 MG PO CAPS
100.0000 mg | ORAL_CAPSULE | Freq: Once | ORAL | Status: AC
  Administered 2022-11-04: 100 mg via ORAL
  Filled 2022-11-04: qty 1

## 2022-11-04 NOTE — Progress Notes (Signed)
[place] NOTE       Patient ID: Anne Mitchell MRN: [id number] DOB/AGE: Jan 27, 1932 89 y.o.  Admit date: 10/31/2022 Referring Physician Dr. Judd Gaudier Primary Physician Dr. Kym Groom Primary Cardiologist Dr. Nehemiah Massed Reason for Consultation chest pain, bradycardia  HPI: Anne Mitchell is an 55yoF with a PMH of HF w/ recovered EF (55-60%, g1DD 10/2022, prev 25-30%, multiple rWMAs 2021), hx takotsubo CM, paroxysmal AF (eliquis), CKD 3 who presented to [facility] ED 10/31/22 with right sided chest pain that radiated to her shoulder. She was in AF RVR on presentation, converted to a junctional bradycardia on amiodarone + a 3 second sinus pause with conversion. She had hypotension requiring levophed the PM of 2/18 and intermittent right sided chest discomfort, responsive to NSAIDS. Continued paroxysms of AF RVR + a 9 second & several 2-3 second conversion pauses that she is symptomatic from. Plan for Micra PPM on Thursday 2/22.   Interval History: - didn't sleep well last night. Occasionally feeling lightheaded / palpitations. - overnight events noted, s/p IV lasix '20mg'$  x 1 early am due to shortness of breath. Repeat cxr with vascular congestion  - remains in sinus brady with rates low 50s  Review of systems complete and found to be negative unless listed above     Past Medical History:  Diagnosis Date   Diastolic heart failure (HCC)    Emphysema (subcutaneous) (surgical) resulting from a procedure    Not correct   Emphysema lung ([place])    Hypertension     Past Surgical History:  Procedure Laterality Date   LEFT HEART CATH AND CORONARY ANGIOGRAPHY N/A 03/22/2020   Procedure: LEFT HEART CATH AND CORONARY ANGIOGRAPHY;  Surgeon: Corey Skains, MD;  Location: [place] [facility];  Service: Cardiovascular;  Laterality: N/A;    Medications Prior to Admission  Medication Sig Dispense Refill Last Dose   albuterol (VENTOLIN HFA) 108 (90 Base) MCG/ACT inhaler Inhale 2 puffs  into the lungs every 6 (six) hours as needed for wheezing or shortness of breath. 8 g 2 prn at prn   atorvastatin (LIPITOR) 80 MG tablet Take 1 tablet (80 mg total) by mouth daily. 30 tablet 0 10/30/2022 at 1000   Calcium Carb-Cholecalciferol (CALCIUM 500 + D PO) Take 1 tablet by mouth as directed. Take 1 tablet three times a week on Sunday, Wednesday and Friday mornings.   10/30/2022 at 1000   Cholecalciferol 50 MCG (2000 UT) CAPS Take 2,000 Units by mouth daily.   10/30/2022 at 1000   diphenhydrAMINE (BENADRYL) 25 mg capsule Take 25 mg by mouth daily.      ELIQUIS 2.5 MG TABS tablet Take 2.5 mg by mouth 2 (two) times daily.   10/30/2022 at 2200   furosemide (LASIX) 40 MG tablet Take 40 mg by mouth daily.   10/30/2022 at 1000   isosorbide mononitrate (IMDUR) 30 MG 24 hr tablet Take 30 mg by mouth daily.   10/30/2022 at 1000   lisinopril (ZESTRIL) 40 MG tablet Take 40 mg by mouth daily.   10/30/2022 at 1000   Multiple Vitamins-Minerals (VITRUM SENIOR) TABS Take by mouth.   10/30/2022 at 1000   sertraline (ZOLOFT) 50 MG tablet Take 50 mg by mouth daily.   10/30/2022 at 1000   spironolactone (ALDACTONE) 25 MG tablet Take 1 tablet (25 mg total) by mouth daily. 30 tablet 0 10/30/2022 at 1000   aspirin EC 81 MG EC tablet Take 1 tablet (81 mg total) by mouth daily. Swallow whole. (Patient not  taking: Reported on 10/31/2022) 30 tablet 0 Not Taking   metoprolol tartrate (LOPRESSOR) 25 MG tablet Take 0.5 tablets (12.5 mg total) by mouth 2 (two) times daily. (Patient not taking: Reported on 10/31/2022) 30 tablet 0 Not Taking   Social History   Socioeconomic History   Marital status: Unknown    Spouse name: Not on file   Number of children: Not on file   Years of education: Not on file   Highest education level: Not on file  Occupational History   Not on file  Tobacco Use   Smoking status: Never   Smokeless tobacco: Never  Substance and Sexual Activity   Alcohol use: Never   Drug use: Never   Sexual  activity: Not on file  Other Topics Concern   Not on file  Social History Narrative   Not on file   Social Determinants of Health   Financial Resource Strain: Not on file  Food Insecurity: No Food Insecurity (10/31/2022)   Hunger Vital Sign    Worried About Running Out of Food in the Last Year: Never true    Ran Out of Food in the Last Year: Never true  Transportation Needs: No Transportation Needs (10/31/2022)   PRAPARE - Hydrologist (Medical): No    Lack of Transportation (Non-Medical): No  Physical Activity: Not on file  Stress: Not on file  Social Connections: Not on file  Intimate Partner Violence: Not At Risk (10/31/2022)   Humiliation, Afraid, Rape, and Kick questionnaire    Fear of Current or Ex-Partner: No    Emotionally Abused: No    Physically Abused: No    Sexually Abused: No    Family History  Problem Relation Age of Onset   Colon cancer Mother    Hypertension Mother       Intake/Output Summary (Last 24 hours) at 11/04/2022 0818 Last data filed at 11/04/2022 [id number] Gross per 24 hour  Intake 343.06 ml  Output 850 ml  Net -506.94 ml     Vitals:   11/04/22 0500 11/04/22 0600 11/04/22 0700 11/04/22 0800  BP: (!) 141/51 (!) 119/58 (!) 104/45 (!) 102/48  Pulse: (!) 51 (!) 55 (!) 51 (!) 51  Resp: (!) 21 (!) 26 (!) 23 (!) 26  Temp:    98.2 F (36.8 C)  TempSrc:      SpO2: 100% 100% 99% 99%  Weight: 55.6 kg     Height:        PHYSICAL EXAM General: Pleasant elderly Caucasian female, well nourished, in no acute distress. Laying nearly flat in ICU bed HEENT:  Normocephalic and atraumatic.  Somewhat hard of hearing. Neck:  No JVD.  Lungs: Normal respiratory effort on oxygen by nasal cannula.  Decreased breath sounds with crackles bilaterally  heart: bradycardic but regular. Normal S1 and S2 without gallops or murmurs.  Abdomen: Non-distended appearing.  Msk: Normal strength and tone for age. Extremities: Warm and well perfused. No  clubbing, cyanosis.  No peripheral edema.  Neuro: Alert and oriented X 3. Psych:  Answers questions appropriately.   Labs: Basic Metabolic Panel: Recent Labs    11/03/22 0407 11/04/22 0518  NA 138 134*  K 4.3 4.6  CL 106 105  CO2 24 22  GLUCOSE 99 102*  BUN 19 17  CREATININE 0.89 0.94  CALCIUM 8.6* 8.6*  MG 1.8 2.2    Liver Function Tests: No results for input(s): "AST", "ALT", "ALKPHOS", "BILITOT", "PROT", "ALBUMIN" in the last 72 hours.  No results for input(s): "LIPASE", "AMYLASE" in the last 72 hours. CBC: Recent Labs    11/03/22 0407 11/04/22 0518  WBC 7.2 8.0  HGB 10.3* 9.9*  HCT 32.4* 30.9*  MCV 91.0 89.8  PLT 225 226    Cardiac Enzymes: Recent Labs    11/02/22 0752 11/02/22 1104  TROPONINIHS 127* 103*    BNP: Recent Labs    11/02/22 1104  BNP 771.6*    D-Dimer: No results for input(s): "DDIMER" in the last 72 hours.  Hemoglobin A1C: No results for input(s): "HGBA1C" in the last 72 hours. Fasting Lipid Panel: No results for input(s): "CHOL", "HDL", "LDLCALC", "TRIG", "CHOLHDL", "LDLDIRECT" in the last 72 hours. Thyroid Function Tests: No results for input(s): "TSH", "T4TOTAL", "T3FREE", "THYROIDAB" in the last 72 hours.  Invalid input(s): "FREET3" Anemia Panel: No results for input(s): "VITAMINB12", "FOLATE", "FERRITIN", "TIBC", "IRON", "RETICCTPCT" in the last 72 hours.   Radiology: [facility] Chest Port 1 View  Result Date: 11/04/2022 CLINICAL DATA:  89 year old female with tachypnea. Planned pacemaker placement. EXAM: PORTABLE CHEST 1 VIEW COMPARISON:  CT Chest, Abdomen, and Pelvis 11/01/2022, and earlier. FINDINGS: Portable AP upright view at 0329 hours. Pacer or resuscitation pads project over the chest and upper abdomen. Stable lung volumes and mediastinal contours. Coarse bilateral pulmonary interstitial opacity corresponding to recent CT lung findings. No pneumothorax, pleural effusion or consolidation. But pulmonary vascularity remains  increased compared to portable chest 10/31/2022. Stable visualized osseous structures.  Negative visible bowel gas. IMPRESSION: 1. Possible pulmonary interstitial edema superimposed on chronic lung disease. 2. No pleural effusion or other acute cardiopulmonary abnormality. Electronically Signed   By: Genevie Ann M.D.   On: 11/04/2022 04:04   CT CHEST ABDOMEN PELVIS WO CONTRAST  Result Date: 11/01/2022 CLINICAL DATA:  Hypotension.  Evaluate for possible aneurysm EXAM: CT CHEST, ABDOMEN AND PELVIS WITHOUT CONTRAST TECHNIQUE: Multidetector CT imaging of the chest, abdomen and pelvis was performed following the standard protocol without IV contrast. RADIATION DOSE REDUCTION: This exam was performed according to the departmental dose-optimization program which includes automated exposure control, adjustment of the mA and/or kV according to patient size and/or use of iterative reconstruction technique. COMPARISON:  Chest radiograph dated 11/01/2022. FINDINGS: Evaluation of this exam is limited in the absence of intravenous contrast. CT CHEST FINDINGS Cardiovascular: There is no cardiomegaly or pericardial effusion. There is advanced 3 vessel coronary vascular calcification. Moderate atherosclerotic calcification of the thoracic aorta. No aneurysmal dilatation. The central pulmonary arteries are grossly unremarkable on this noncontrast CT. Mediastinum/Nodes: No hilar or mediastinal adenopathy. The esophagus is grossly unremarkable. No mediastinal fluid collection. Lungs/Pleura: Scattered pulmonary nodules measure up to 5 mm in the left upper lobe. Findings may be related to underlying granulomatous disease such as sarcoid or other inflammatory processes. Clinical correlation is recommended. There is a focal area of pleural thickening with associated reticulation in the anterior right middle lobe, likely scarring. There is trace bilateral pleural effusions. No focal consolidation or pneumothorax. The central airways are  patent. Musculoskeletal: Osteopenia with degenerative changes of the spine. No acute osseous pathology. CT ABDOMEN PELVIS FINDINGS No intra-abdominal free air or free fluid. Hepatobiliary: The liver is unremarkable. No biliary dilatation. The gallbladder is unremarkable. Pancreas: Mild haziness of the fat adjacent to the head and uncinate process of the pancreas. Correlation with pancreatic enzymes recommended to exclude pancreatitis. No fluid collection or abscess. Spleen: Normal in size without focal abnormality. Adrenals/Urinary Tract: The adrenal glands are unremarkable. Mild bilateral renal parenchyma atrophy. There is no hydronephrosis  or nephrolithiasis on either side. There is a 5 cm right renal inferior pole cyst. The visualized ureters and urinary bladder appear unremarkable. Stomach/Bowel: There is severe sigmoid diverticulosis without active inflammatory changes. There is no bowel obstruction or active inflammation. The appendix is not visualized with certainty. No inflammatory changes identified in the right lower quadrant. Vascular/Lymphatic: Advanced aortoiliac atherosclerotic disease. No aneurysmal dilatation. The IVC is unremarkable. No portal venous gas. There is no adenopathy. Reproductive: Hysterectomy. The right ovary is not identified with certainty. Left ovarian cyst measure up to 2.5 cm. No follow-up imaging recommended. Note: This recommendation does not apply to premenarchal patients and to those with increased risk (genetic, family history, elevated tumor markers or other high-risk factors) of ovarian cancer. Reference: JACR 2020 Feb; 17(2):248-254 Other: None Musculoskeletal: Osteopenia with multilevel degenerative changes of the spine. No acute osseous pathology. IMPRESSION: 1. No aortic aneurysm. 2. Trace bilateral pleural effusions. 3. Scattered pulmonary nodules measure up to 5 mm in the left upper lobe. Findings may be related to underlying granulomatous disease such as sarcoid or  other inflammatory processes. Follow-up with chest CT in 6 months recommended. 4. Mild haziness of the fat adjacent to the head and uncinate process of the pancreas. Correlation with pancreatic enzymes recommended to exclude pancreatitis. No fluid collection or abscess. 5. Severe sigmoid diverticulosis without active inflammatory changes. No bowel obstruction. 6.  Aortic Atherosclerosis (ICD10-I70.0). Electronically Signed   By: Anner Crete M.D.   On: 11/01/2022 22:22   DG Chest Port 1 View  Result Date: 11/01/2022 CLINICAL DATA:  Dyspnea EXAM: PORTABLE CHEST 1 VIEW COMPARISON:  10/31/2022 FINDINGS: Cardiac shadow is prominent but stable from the prior exam. Diffuse interstitial changes are again identified although increased central vascular congestion is now noted. No edema is seen. No bony abnormality is noted. IMPRESSION: New superimposed vascular congestion is now seen overlying the previously seen chronic interstitial changes. Electronically Signed   By: Inez Catalina M.D.   On: 11/01/2022 18:46   [place] EKG SITE RITE  Result Date: 11/01/2022 If Site Rite image not attached, placement could not be confirmed due to current cardiac rhythm.  ECHOCARDIOGRAM COMPLETE  Result Date: 11/01/2022    ECHOCARDIOGRAM REPORT   Patient Name:   Anne Mitchell Date of Exam: 11/01/2022 Medical Rec #:  [id number]      Height:       61.0 in Accession #:    [id number]     Weight:       114.2 lb Date of Birth:  [birth year]      BSA:          1.488 m Patient Age:    65 years       BP:           99/45 mmHg Patient Gender: F              HR:           139 bpm. Exam Location:  [facility] Procedure: 2D Echo, Color Doppler, Cardiac Doppler and Intracardiac            Opacification Agent Indications:     Chest Pain R07.9  History:         Patient has prior history of Echocardiogram examinations. DHF,                  Emphysema; Risk Factors:Hypertension.  Sonographer:     L. Thornton-Maynard Referring Phys:  [id number] Otila Kluver LAI Diagnosing  Phys: Yolonda Kida MD IMPRESSIONS  1. AFIB during study.  2. Left ventricular ejection fraction, by estimation, is 55 to 60%. The left ventricle has normal function. The left ventricle has no regional wall motion abnormalities. Left ventricular diastolic parameters are consistent with Grade I diastolic dysfunction (impaired relaxation).  3. Right ventricular systolic function is normal. The right ventricular size is normal. There is normal pulmonary artery systolic pressure.  4. The mitral valve is normal in structure. Trivial mitral valve regurgitation.  5. The aortic valve is normal in structure. Aortic valve regurgitation is not visualized. FINDINGS  Left Ventricle: Left ventricular ejection fraction, by estimation, is 55 to 60%. The left ventricle has normal function. The left ventricle has no regional wall motion abnormalities. Definity contrast agent was given IV to delineate the left ventricular  endocardial borders. The left ventricular internal cavity size was normal in size. There is borderline concentric left ventricular hypertrophy. Left ventricular diastolic parameters are consistent with Grade I diastolic dysfunction (impaired relaxation). Right Ventricle: The right ventricular size is normal. No increase in right ventricular wall thickness. Right ventricular systolic function is normal. There is normal pulmonary artery systolic pressure. The tricuspid regurgitant velocity is 2.44 m/s, and  with an assumed right atrial pressure of 3 mmHg, the estimated right ventricular systolic pressure is 0000000 mmHg. Left Atrium: Left atrial size was normal in size. Right Atrium: Right atrial size was normal in size. Pericardium: There is no evidence of pericardial effusion. Mitral Valve: The mitral valve is normal in structure. Trivial mitral valve regurgitation. Tricuspid Valve: The tricuspid valve is normal in structure. Tricuspid valve regurgitation is trivial. Aortic Valve: The aortic valve is normal in  structure. Aortic valve regurgitation is not visualized. Aortic valve mean gradient measures 4.0 mmHg. Aortic valve peak gradient measures 6.2 mmHg. Aortic valve area, by VTI measures 1.77 cm. Pulmonic Valve: The pulmonic valve was normal in structure. Pulmonic valve regurgitation is not visualized. Aorta: The ascending aorta was not well visualized. IAS/Shunts: No atrial level shunt detected by color flow Doppler. Additional Comments: AFIB during study.  LEFT VENTRICLE PLAX 2D LVIDd:         3.20 cm     Diastology LVIDs:         2.30 cm     LV e' medial:    4.68 cm/s LV PW:         1.00 cm     LV E/e' medial:  14.4 LV IVS:        1.60 cm     LV e' lateral:   5.22 cm/s LVOT diam:     1.70 cm     LV E/e' lateral: 13.0 LV SV:         28 LV SV Index:   19 LVOT Area:     2.27 cm  LV Volumes (MOD) LV vol d, MOD A2C: 21.2 ml LV vol d, MOD A4C: 32.3 ml LV vol s, MOD A2C: 9.6 ml LV vol s, MOD A4C: 9.6 ml LV SV MOD A2C:     11.6 ml LV SV MOD A4C:     32.3 ml LV SV MOD BP:      16.2 ml RIGHT VENTRICLE RV S prime:     9.03 cm/s LEFT ATRIUM             Index        RIGHT ATRIUM          Index LA diam:        3.70 cm 2.49 cm/m  RA Area:     8.51 cm LA Vol (A2C):   32.0 ml 21.50 ml/m  RA Volume:   13.30 ml 8.94 ml/m LA Vol (A4C):   36.6 ml 24.59 ml/m LA Biplane Vol: 34.5 ml 23.18 ml/m  AORTIC VALVE                    PULMONIC VALVE AV Area (Vmax):    1.87 cm     PV Vmax:       1.05 m/s AV Area (Vmean):   1.75 cm     PV Peak grad:  4.4 mmHg AV Area (VTI):     1.77 cm AV Vmax:           124.00 cm/s AV Vmean:          97.050 cm/s AV VTI:            0.160 m AV Peak Grad:      6.2 mmHg AV Mean Grad:      4.0 mmHg LVOT Vmax:         102.20 cm/s LVOT Vmean:        74.750 cm/s LVOT VTI:          0.125 m LVOT/AV VTI ratio: 0.78  AORTA Ao Root diam: 2.90 cm Ao Asc diam:  3.50 cm MITRAL VALVE               TRICUSPID VALVE MV Area (PHT): 11.49 cm   TR Peak grad:   23.8 mmHg MV Decel Time: 66 msec     TR Vmax:        244.00 cm/s  MV E velocity: 67.60 cm/s                            SHUNTS                            Systemic VTI:  0.12 m                            Systemic Diam: 1.70 cm Yolonda Kida MD Electronically signed by Yolonda Kida MD Signature Date/Time: 11/01/2022/2:30:23 PM    Final    DG Shoulder Right  Result Date: 10/31/2022 CLINICAL DATA:  Right shoulder pain. EXAM: RIGHT SHOULDER - 2+ VIEW COMPARISON:  Chest radiograph dated 10/31/2022. FINDINGS: No acute fracture or dislocation the bones are osteopenic. Mild degenerative changes of the right shoulder. Rounded osseous density over the humeral neck may represent loose bodies. The soft tissues are unremarkable. IMPRESSION: 1. No acute fracture or dislocation. 2. Mild degenerative changes of the right shoulder. Electronically Signed   By: Anner Crete M.D.   On: 10/31/2022 02:22   DG Chest 2 View  Result Date: 10/31/2022 CLINICAL DATA:  Chest pain. EXAM: CHEST - 2 VIEW COMPARISON:  Chest radiograph dated 09/16/2022. FINDINGS: There is diffuse chronic interstitial coarsening. Mild elevation of the right hemidiaphragm. No focal consolidation, pleural effusion or pneumothorax. The cardiac silhouette is within limits. Osteopenia with degenerative changes of the spine. No acute osseous pathology. IMPRESSION: 1. No acute cardiopulmonary process. 2. Chronic interstitial coarsening. Electronically Signed   By: Anner Crete M.D.   On: 10/31/2022 02:21    [place] 2021 Prox RCA lesion is 60% stenosed. Mid RCA lesion is 30% stenosed. Ost Cx to Prox Cx lesion is 75% stenosed.  Prox Cx lesion is 65% stenosed. Prox LAD lesion is 30% stenosed. Mid LAD lesion is 20% stenosed.   89 year old female with known coronary atherosclerosis hypertension hyperlipidemia paroxysmal nonvalvular atrial fibrillation having acute systolic dysfunction congestive heart failure elevated troponin with apical ballooning by echocardiogram consistent with stress-induced cardiomyopathy  rather than acute coronary syndrome   Apical ballooning with ejection fraction of 25 to 30%   75% stenosis of ostial left circumflex artery 60% stenosis of right coronary artery Mild atherosclerosis of left anterior descending artery   Assessment Apical ballooning and/or stress-induced cardiomyopathy with minimal elevation of troponin more consistent with heart failure rather than acute coronary syndrome   Plan Medical management including Lasix beta-blocker spironolactone and ACE inhibitor for cardiomyopathy and congestive heart failure No further cardiac intervention at this time Risk factor modification with high intensity cholesterol therapy Antiplatelet therapy Rehabilitation  ECHO EF (55-60%, g1DD 10/2022, prev 25-30%, multiple rWMAs 2021)  TELEMETRY reviewed by me (LT) 11/04/2022 : sinus brady with intermittent junctional beats, rates high 30s-mid 40s overnight, now AF RVR rate 110-130s  EKG reviewed by me: AF RVR rate 160s  Data reviewed by me (LT) 11/04/2022:  critical care progress note, last 24h labs, imaging, vitals tele    Principal Problem:   Unstable angina ([place]) Active Problems:   Essential hypertension   Chronic anticoagulation   Rapid atrial fibrillation (HCC)   Chest pain   Coronary artery disease   Bradyarrhythmia   Atrial fibrillation with rapid ventricular response ([place])    ASSESSMENT AND PLAN:  Anne Mitchell is an 57yoF with a PMH of HF w/ recovered EF (55-60%, g1DD 10/2022, prev 25-30%, multiple rWMAs 2021), hx takotsubo CM, paroxysmal AF (eliquis), CKD 3 who presented to [facility] ED 10/31/22 with right sided chest pain that radiated to her shoulder. She was in AF RVR on presentation, converted to a junctional bradycardia on amiodarone + a 3 second sinus pause with conversion. She had hypotension requiring levophed the PM of 2/18 and intermittent right sided chest discomfort, responsive to NSAIDS. Continued paroxysms of AF RVR + a 9 second & several 2-3 second  conversion pauses that she is symptomatic from. Plan for Micra PPM on Thursday 2/22.   # paroxysmal AF RVR  # symptomatic bradycardia Presented with atrial fibrillation with RVR with rates as high as 160s, initially on diltiazem and amiodarone infusions for rate and rhythm control.  Eventually converted to a junctional bradycardia with a ~3 second conversion pause.  Back to AF RVR in AM 2/20 + symptomatic following conversion to sinus / intermittent junctional brady for which PPM is recommended.  -s/p metoprolol tartrate 12.'5mg'$  x1 for rate control while in AF. Hold further doses. -Continue p.o. amiodarone 200 mg twice daily for now, likely discharge on 200 mg once daily -No indication for temporary transvenous pacemaker at this time -Monitor and replete electrolytes for a K >4, mag >2 -Continue heparin for anticoagulation while inpatient, on dose reduced Eliquis 2.'5mg'$  BID at home (wt <60kg & age >66)  - The patient and daughter (by phone) are agreeable to proceed with Micra Leadless PPM on 2/22 at 8:30am with Dr. Saralyn Pilar. Written consent will be obtained. NPO after midnight except for sips with meds on 2/22.   # non-obstructive CAD + chest pain with typical and atypical features # demand ischemia  Reported chest pressure and "pulsations" on the right side of her chest radiating to her neck and down her shoulder that occurred at rest initially prompting her presentation, in AF  RVR on admission as above. Some response to NSAIDS. Troponins elevated and trending 31, 39, 127, 103, likely demand/supply mismatch and not ACS without ischemic EKG changes and echo is without RWMA's and actually shows recovered EF compared to 2021.   -cancelled Lexiscan Myoview w/ symptomatic bradycardia + hypotension after conversion from AF RVR to junctional brady as above.   # acute on chronic HF  Recovered EF at 55-60% with g1dd, BNP elevated at 700, suspect decompensation in the setting of AF RVR. Remains on  supplemental O2. Repeat CXR 2/21 with vascular congestion - s/p IV lasix '20mg'$  x1, give another '40mg'$  IV around 12p today. - no BB with baseline bradycardia. GDMT reinitiation limited by hypotension. Off levophed entirely since 2/19. S/p 2L IVF for hypotension - previously prescribed lisinopril 20 mg every other day, Imdur 30 mg daily, spironolactone 25 mg once daily, and Lasix 40 mg once daily  # CKD 3  Renal function normalized following IVF.   This patient's plan of care was discussed and created with Dr. Saralyn Pilar and he is in agreement.  Signed: Tristan Schroeder , PA-C 11/04/2022, 8:18 AM [facility] Cardiology

## 2022-11-04 NOTE — Progress Notes (Signed)
       CROSS COVER NOTE  NAME: Catharina Steeples MRN: [id number] DOB : Jan 22, 1932 ATTENDING PHYSICIAN: Enzo Bi, MD    Date of Service   11/04/2022   HPI/Events of Note   Message received from RN reporting tachypnea with RR in 60s. Patient is not dyspneic and there has been no change in O2 requirements. At bedside M(r)s Rokes describes mild chest tightness that has been present since arrival. ON auscultation she has diminished breath sounds in (R) upper lobe and fine crackles in bilateral bases. Breathing treatment given with mild improvement in symptoms.  She also reports poor sleep tonight secondary to her legs being "jumpy". K 4.3 Mg 1.8 Ca 8.6 yesterday on AM labs.  Interventions   Assessment/Plan:  Acute on Chronic Diastolic CHF CXR ---> pulmonary vascular congestion still present  20 mg IV Furosemide  Restless Legs Gabapentin 100 mg x1      To reach the provider On-Call:   7AM- 7PM see care teams to locate the attending and reach out to them via [email or web address] Password: [email or web address] 7PM-7AM contact night-coverage If you still have difficulty reaching the appropriate provider, please page the [facility] (Director on Call) for [facility] on amion for assistance  This document was prepared using Systems analyst and may include unintentional dictation errors.  Neomia Glass DNP, MBA, FNP-BC, PMHNP-BC Nurse Practitioner [facility] Pager [phone]

## 2022-11-04 NOTE — Progress Notes (Signed)
  PROGRESS NOTE    Anne Mitchell  [id number] DOB: [birth year] DOA: 10/31/2022 PCP: Valera Castle, MD  [id number]  LOS: 3 days   Brief hospital course:   Assessment & Plan: Anne Mitchell is a 89 y.o. 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    On arrival of EMS heart rate was in the 140s and she was in A-fib.   Afib w RVR Junctional bradycardia  Patient converted to junctional bradycardia in the 50s after diltiazem bolus and infusion.  Rate control agents held.  Pt went into Afib RVR again morning of 2/18, with BP dropping.  Pt then was started amiodarone bolus+gtt. --cardiology consulted Plan: --cont amiodarone as oral 200 mg BID --cont heparin gtt for stroke ppx --plan for pacemaker placement on 2/22.  Hypotension --developed hypotension morning of 2/18 with BP down in 70's, due to Afib RVR.  Received 2L IVF and on pressor briefly. --BP currently stable  Chest pain --seems atypical, right-sided, seemed to improve with IV toradol --completed 72 hours of heparin gtt. --stress test was not completed due to symptomatic bradycardia --cont Imdur  --IV toradol PRN for pain  Trop elevation 2/2 demand ischemia --Likely demand from RVR. --further workup per cardio  CAD, history of NSTEMI 2021 History of stress cardiomyopathy 2021 Patient with history of multiple vessel but nonobstructive CAD on cath 2021 --cont statin --further workup per cardio  AKI CKD 3 ruled out --Cr 1.04 on presentation, up to 1.7  --AKI resolved with IVF  Acute on chronic diastolic CHF --Recovered EF at 55-60% with g1dd, BNP elevated at 700, suspect decompensation in the setting of AF RVR and  also having received 2L IVF while hypotensive.  CXR showed New superimposed vascular congestion. --IV lasix 40 today   DVT prophylaxis: [id number] gtt Code Status: Full code  Family Communication:  Level of care: Progressive Dispo:   The patient is from: home Anticipated d/c is to: home Anticipated d/c date is: friday   Subjective and Interval History:  Pt had no new complaints.  Started on IV lasix for tachypnea.   Objective: Vitals:   11/04/22 1200 11/04/22 1300 11/04/22 1400 11/04/22 1500  BP: (!) 106/50 (!) 108/44 (!) 131/50 123/73  Pulse: (!) 47 (!) 44 (!) 53 (!) 58  Resp: (!) 24 (!) 23 (!) 25 (!) 28  Temp: 97.8 F (36.6 C)     TempSrc:      SpO2: 94% 95% (!) 89% 99%  Weight:      Height:        Intake/Output Summary (Last 24 hours) at 11/04/2022 1546 Last data filed at 11/04/2022 1500 Gross per 24 hour  Intake 405.06 ml  Output 1150 ml  Net -744.94 ml   Filed Weights   11/02/22 0136 11/03/22 0405 11/04/22 0500  Weight: 51.8 kg 55.3 kg 55.6 kg    Examination:   Constitutional: NAD CV: No cyanosis.   RESP: normal respiratory effort, on RA   Data Reviewed: I have personally review ed labs and imaging studies  Time spent: 25 minutes   Enzo Bi, MD [facility] If 7PM-7AM, please contact night-coverage 11/04/2022, 3:46 PM

## 2022-11-04 NOTE — Progress Notes (Signed)
[place] for Heparin  Indication: chest pain/ACS  Allergies  Allergen Reactions   Other Rash and Swelling    "mycin" meds  Gin  Gin alcohol. Swelling of the genital area   Penicillin G Itching   Banana Swelling   Penicillins Swelling   Sulfa Antibiotics     Patient Measurements: Height: 5' 1"$  (154.9 cm) Weight: 55.6 kg (122 lb 9.2 oz) IBW/kg (Calculated) : 47.8 Heparin Dosing Weight: 51.8 kg   Vital Signs: Temp: 98.2 F (36.8 C) (02/20 2352) Temp Source: Oral (02/20 2352) BP: 119/58 (02/21 0600) Pulse Rate: 55 (02/21 0600)  Labs: Recent Labs    11/02/22 0302 11/02/22 0620 11/02/22 0752 11/02/22 1104 11/03/22 0407 11/04/22 0518  HGB 10.2*  --   --   --  10.3* 9.9*  HCT 31.5*  --   --   --  32.4* 30.9*  PLT 212  --   --   --  225 226  APTT >200* 91*  --   --   --   --   HEPARINUNFRC 0.92* 0.37  --   --  0.32 0.18*  CREATININE 1.31*  --   --   --  0.89 0.94  TROPONINIHS  --   --  127* 103*  --   --      Estimated Creatinine Clearance: 31.2 mL/min (by C-G formula based on SCr of 0.94 mg/dL).   Medical History: Past Medical History:  Diagnosis Date   Diastolic heart failure (HCC)    Emphysema (subcutaneous) (surgical) resulting from a procedure    Not correct   Emphysema lung ([place])    Hypertension     Medications:  Medications Prior to Admission  Medication Sig Dispense Refill Last Dose   albuterol (VENTOLIN HFA) 108 (90 Base) MCG/ACT inhaler Inhale 2 puffs into the lungs every 6 (six) hours as needed for wheezing or shortness of breath. 8 g 2 prn at prn   atorvastatin (LIPITOR) 80 MG tablet Take 1 tablet (80 mg total) by mouth daily. 30 tablet 0 10/30/2022 at 1000   Calcium Carb-Cholecalciferol (CALCIUM 500 + D PO) Take 1 tablet by mouth as directed. Take 1 tablet three times a week on Sunday, Wednesday and Friday mornings.   10/30/2022 at 1000   Cholecalciferol 50 MCG (2000 UT) CAPS Take 2,000 Units by mouth daily.    10/30/2022 at 1000   diphenhydrAMINE (BENADRYL) 25 mg capsule Take 25 mg by mouth daily.      ELIQUIS 2.5 MG TABS tablet Take 2.5 mg by mouth 2 (two) times daily.   10/30/2022 at 2200   furosemide (LASIX) 40 MG tablet Take 40 mg by mouth daily.   10/30/2022 at 1000   isosorbide mononitrate (IMDUR) 30 MG 24 hr tablet Take 30 mg by mouth daily.   10/30/2022 at 1000   lisinopril (ZESTRIL) 40 MG tablet Take 40 mg by mouth daily.   10/30/2022 at 1000   Multiple Vitamins-Minerals (VITRUM SENIOR) TABS Take by mouth.   10/30/2022 at 1000   sertraline (ZOLOFT) 50 MG tablet Take 50 mg by mouth daily.   10/30/2022 at 1000   spironolactone (ALDACTONE) 25 MG tablet Take 1 tablet (25 mg total) by mouth daily. 30 tablet 0 10/30/2022 at 1000   aspirin EC 81 MG EC tablet Take 1 tablet (81 mg total) by mouth daily. Swallow whole. (Patient not taking: Reported on 10/31/2022) 30 tablet 0 Not Taking   metoprolol tartrate (LOPRESSOR) 25 MG tablet Take 0.5  tablets (12.5 mg total) by mouth 2 (two) times daily. (Patient not taking: Reported on 10/31/2022) 30 tablet 0 Not Taking    Assessment: Pharmacy consulted to dose heparin in this 89 year old female admitted with ACS/NSTEMI.  Pt was on Eliquis 2.5 mg PO BID, last dose on 2/16 @ 2200.  CrCl = 28.2 ml/min  2/17 1341 aPTT=73 Therapeutic x1 2/17 2100 aPTT=85 Therapeutic x 2 2/18 0351 aPTT=78,  HL = 0.84 2/19 0302 aPTT=> 200,  HL = 0.91 (suspect lab error) 2/19 0620 aPTT=91, HL = 0.37, therapeutic X 4 2/20 0407 HL 0.32, therapeutic x 5 2/21 0518 HL 0.18, subtherapeutic  Goal of Therapy:  aPTT 66-102 Heparin level 0.3-0.7 units/ml Monitor platelets by anticoagulation protocol: Yes   Plan:  -Bolus 750 units x 1 -Increase heparin infusion to 750 units/hr  -Recheck HL in 8 hrs after rate change - CBC daily while on heparin  Renda Rolls, PharmD, [facility] 11/04/2022 6:27 AM

## 2022-11-04 NOTE — Evaluation (Signed)
Physical Therapy Evaluation Patient Details Name: Anne Mitchell MRN: [id number] DOB: [birth year] Today's Date: 11/04/2022  History of Present Illness  89yoF with a PMH of HF w/ recovered EF (55-60%, g1DD 10/2022, prev 25-30%, multiple rWMAs 2021), hx takotsubo CM, paroxysmal AF (eliquis), CKD 3 who presented to [facility] ED 10/31/22 with right sided chest pain that radiated to her shoulder. She was in AF RVR on presentation, converted to a junctional bradycardia on amiodarone + a 3 second sinus pause with conversion.  Clinical Impression  Pt pleasant and eager to work with PT.  On 2L on arrival with SpO2 at 100%, discussed with RN and down to room air t/o the session, did drop to high 80s after 60 ft of ambulation, but generally mid to high 90s most of the time, HR 50s on arrival but to ~70 with activity.  Pt did well with bed mobility, reports not feeling too far off from baseline but quicker to fatigue.  Pt to have pacer placed tomorrow, per today's performance expect she should be able to return home safely with her current set up.  Continue PT to address functional limitations and further assess activity tolerance after procedure.       Recommendations for follow up therapy are one component of a multi-disciplinary discharge planning process, led by the attending physician.  Recommendations may be updated based on patient status, additional functional criteria and insurance authorization.  Follow Up Recommendations Home health PT      Assistance Recommended at Discharge Intermittent Supervision/Assistance  Patient can return home with the following  Assistance with cooking/housework;Assist for transportation    Equipment Recommendations None recommended by PT  Recommendations for Other Services       Functional Status Assessment Patient has had a recent decline in their functional status and demonstrates the ability to make significant improvements in function in a reasonable and predictable  amount of time.     Precautions / Restrictions Precautions Precautions: Fall Restrictions Weight Bearing Restrictions: No      Mobility  Bed Mobility Overal bed mobility: Modified Independent             General bed mobility comments: easily gets to sitting EOB    Transfers Overall transfer level: Needs assistance Equipment used: Rolling walker (2 wheels) Transfers: Sit to/from Stand Sit to Stand: Supervision           General transfer comment: light cuing to insure appriate set up    Ambulation/Gait Ambulation/Gait assistance: Supervision Gait Distance (Feet): 60 Feet Assistive device: Rolling walker (2 wheels)         General Gait Details: Pt with safe, consistent, slow bout of ambulation into the hallway and back.  She was not overly reliant on the walker, O2 on room air dropped high 90s to high 80s, but quickly returned to mid 90s on sitting.  HR in 50s to start and up to ~70 with ambulation.  Stairs            Wheelchair Mobility    Modified Rankin (Stroke Patients Only)       Balance Overall balance assessment: Mild deficits observed, not formally tested                                           Pertinent Vitals/Pain Pain Assessment Pain Assessment: No/denies pain    Home Living Family/patient expects to be discharged to::  Private residence Living Arrangements: Children Available Help at Discharge: Family;Available PRN/intermittently (daughter works (<10 minutes away), stops home at lunch) Type of Home: Apartment Home Access: Elevator       Home Layout: One level Home Equipment: Conservation officer, nature (2 wheels);Cane - single point      Prior Function Prior Level of Function : Independent/Modified Independent             Mobility Comments: Pt reports she does not use AD in the home, doesn't get out a lot other than MD apts       Hand Dominance        Extremity/Trunk Assessment   Upper Extremity  Assessment Upper Extremity Assessment: Generalized weakness;Overall WFL for tasks assessed (age appropriate limitaitons)    Lower Extremity Assessment Lower Extremity Assessment: Generalized weakness;Overall WFL for tasks assessed (age appropraite limitations)       Communication   Communication: No difficulties  Cognition Arousal/Alertness: Awake/alert Behavior During Therapy: WFL for tasks assessed/performed Overall Cognitive Status: Within Functional Limits for tasks assessed                                          General Comments General comments (skin integrity, edema, etc.): Pt was eager to get up and work with PT despite feeling tired from not sleeping much    Exercises     Assessment/Plan    PT Assessment Patient needs continued PT services  PT Problem List Decreased strength;Decreased activity tolerance;Decreased balance;Decreased mobility;Decreased safety awareness;Decreased knowledge of use of DME;Cardiopulmonary status limiting activity       PT Treatment Interventions DME instruction;Gait training;Functional mobility training;Therapeutic activities;Therapeutic exercise;Balance training;Patient/family education    PT Goals (Current goals can be found in the Care Plan section)  Acute Rehab PT Goals Patient Stated Goal: Go home PT Goal Formulation: With patient Time For Goal Achievement: 11/17/22 Potential to Achieve Goals: Good    Frequency Min 2X/week     Co-evaluation               AM-PAC PT "6 Clicks" Mobility  Outcome Measure Help needed turning from your back to your side while in a flat bed without using bedrails?: None Help needed moving from lying on your back to sitting on the side of a flat bed without using bedrails?: None Help needed moving to and from a bed to a chair (including a wheelchair)?: None Help needed standing up from a chair using your arms (e.g., wheelchair or bedside chair)?: None Help needed to walk in  hospital room?: A Little Help needed climbing 3-5 steps with a railing? : A Little 6 Click Score: 22    End of Session Equipment Utilized During Treatment: Gait belt Activity Tolerance: Patient tolerated treatment well Patient left: in chair;with call bell/phone within reach Nurse Communication: Mobility status PT Visit Diagnosis: Muscle weakness (generalized) (M62.81);Difficulty in walking, not elsewhere classified (R26.2)    Time: [id number] PT Time Calculation (min) (ACUTE ONLY): 33 min   Charges:   PT Evaluation $PT Eval Low Complexity: 1 Low PT Treatments $Gait Training: 8-22 mins        Kreg Shropshire, DPT 11/04/2022, 10:13 AM

## 2022-11-04 NOTE — Progress Notes (Signed)
[place] for Heparin  Indication: chest pain/ACS  Allergies  Allergen Reactions   Other Rash and Swelling    "mycin" meds  Gin  Gin alcohol. Swelling of the genital area   Penicillin G Itching   Banana Swelling   Penicillins Swelling   Sulfa Antibiotics     Patient Measurements: Height: 5' 1"$  (154.9 cm) Weight: 55.6 kg (122 lb 9.2 oz) IBW/kg (Calculated) : 47.8 Heparin Dosing Weight: 51.8 kg   Vital Signs: Temp: 97.8 F (36.6 C) (02/21 1200) BP: 123/73 (02/21 1500) Pulse Rate: 58 (02/21 1500)  Labs: Recent Labs    11/02/22 0302 11/02/22 0620 11/02/22 0752 11/02/22 1104 11/03/22 0407 11/04/22 0518 11/04/22 1457  HGB 10.2*  --   --   --  10.3* 9.9*  --   HCT 31.5*  --   --   --  32.4* 30.9*  --   PLT 212  --   --   --  225 226  --   APTT >200* 91*  --   --   --   --   --   HEPARINUNFRC 0.92* 0.37  --   --  0.32 0.18* 0.38  CREATININE 1.31*  --   --   --  0.89 0.94  --   TROPONINIHS  --   --  127* 103*  --   --   --      Estimated Creatinine Clearance: 31.2 mL/min (by C-G formula based on SCr of 0.94 mg/dL).   Medical History: Past Medical History:  Diagnosis Date   Diastolic heart failure (HCC)    Emphysema (subcutaneous) (surgical) resulting from a procedure    Not correct   Emphysema lung ([place])    Hypertension     Medications:  Medications Prior to Admission  Medication Sig Dispense Refill Last Dose   albuterol (VENTOLIN HFA) 108 (90 Base) MCG/ACT inhaler Inhale 2 puffs into the lungs every 6 (six) hours as needed for wheezing or shortness of breath. 8 g 2 prn at prn   atorvastatin (LIPITOR) 80 MG tablet Take 1 tablet (80 mg total) by mouth daily. 30 tablet 0 10/30/2022 at 1000   Calcium Carb-Cholecalciferol (CALCIUM 500 + D PO) Take 1 tablet by mouth as directed. Take 1 tablet three times a week on Sunday, Wednesday and Friday mornings.   10/30/2022 at 1000   Cholecalciferol 50 MCG (2000 UT) CAPS Take 2,000  Units by mouth daily.   10/30/2022 at 1000   diphenhydrAMINE (BENADRYL) 25 mg capsule Take 25 mg by mouth daily.      ELIQUIS 2.5 MG TABS tablet Take 2.5 mg by mouth 2 (two) times daily.   10/30/2022 at 2200   furosemide (LASIX) 40 MG tablet Take 40 mg by mouth daily.   10/30/2022 at 1000   isosorbide mononitrate (IMDUR) 30 MG 24 hr tablet Take 30 mg by mouth daily.   10/30/2022 at 1000   lisinopril (ZESTRIL) 40 MG tablet Take 40 mg by mouth daily.   10/30/2022 at 1000   Multiple Vitamins-Minerals (VITRUM SENIOR) TABS Take by mouth.   10/30/2022 at 1000   sertraline (ZOLOFT) 50 MG tablet Take 50 mg by mouth daily.   10/30/2022 at 1000   spironolactone (ALDACTONE) 25 MG tablet Take 1 tablet (25 mg total) by mouth daily. 30 tablet 0 10/30/2022 at 1000   aspirin EC 81 MG EC tablet Take 1 tablet (81 mg total) by mouth daily. Swallow whole. (Patient not taking: Reported on  10/31/2022) 30 tablet 0 Not Taking   metoprolol tartrate (LOPRESSOR) 25 MG tablet Take 0.5 tablets (12.5 mg total) by mouth 2 (two) times daily. (Patient not taking: Reported on 10/31/2022) 30 tablet 0 Not Taking    Assessment: Pharmacy consulted to dose heparin in this 89 year old female admitted with ACS/NSTEMI.  Pt was on Eliquis 2.5 mg PO BID, last dose on 2/16 @ 2200.  CrCl = 28.2 ml/min  2/17 1341 aPTT=73 Therapeutic x1 2/17 2100 aPTT=85 Therapeutic x 2 2/18 0351 aPTT=78,  HL = 0.84 2/19 0302 aPTT=> 200,  HL = 0.91 (suspect lab error) 2/19 0620 aPTT=91, HL = 0.37, therapeutic X 4 2/20 0407 HL 0.32, therapeutic x 5 2/21 0518 HL 0.18, subtherapeutic 2/21 1457 HL 0.38  Goal of Therapy:  aPTT 66-102 Heparin level 0.3-0.7 units/ml Monitor platelets by anticoagulation protocol: Yes   Plan:   Continue heparin infusion at 750 units/hr  Recheck HL in 8 hrs to confirm CBC daily while on heparin  Dorothe Pea, PharmD, BCPS Clinical Pharmacist   11/04/2022 3:33 PM

## 2022-11-04 NOTE — Progress Notes (Signed)
[place] for Heparin  Indication: chest pain/ACS  Allergies  Allergen Reactions   Other Rash and Swelling    "mycin" meds  Gin  Gin alcohol. Swelling of the genital area   Penicillin G Itching   Banana Swelling   Penicillins Swelling   Sulfa Antibiotics     Patient Measurements: Height: 5' 1"$  (154.9 cm) Weight: 55.6 kg (122 lb 9.2 oz) IBW/kg (Calculated) : 47.8 Heparin Dosing Weight: 51.8 kg   Vital Signs: Temp: 97.9 F (36.6 C) (02/21 2000) Temp Source: Oral (02/21 2000) BP: 111/46 (02/21 2200) Pulse Rate: 65 (02/21 2300)  Labs: Recent Labs    11/02/22 0302 11/02/22 0620 11/02/22 0752 11/02/22 1104 11/03/22 0407 11/04/22 0518 11/04/22 1457 11/04/22 2306  HGB 10.2*  --   --   --  10.3* 9.9*  --  10.8*  HCT 31.5*  --   --   --  32.4* 30.9*  --  33.0*  PLT 212  --   --   --  225 226  --  235  APTT >200* 91*  --   --   --   --   --   --   HEPARINUNFRC 0.92* 0.37  --   --  0.32 0.18* 0.38 0.39  CREATININE 1.31*  --   --   --  0.89 0.94  --  1.10*  TROPONINIHS  --   --  127* 103*  --   --   --   --      Estimated Creatinine Clearance: 26.7 mL/min (A) (by C-G formula based on SCr of 1.1 mg/dL (H)).   Medical History: Past Medical History:  Diagnosis Date   Diastolic heart failure (HCC)    Emphysema (subcutaneous) (surgical) resulting from a procedure    Not correct   Emphysema lung ([place])    Hypertension     Medications:  Medications Prior to Admission  Medication Sig Dispense Refill Last Dose   albuterol (VENTOLIN HFA) 108 (90 Base) MCG/ACT inhaler Inhale 2 puffs into the lungs every 6 (six) hours as needed for wheezing or shortness of breath. 8 g 2 prn at prn   atorvastatin (LIPITOR) 80 MG tablet Take 1 tablet (80 mg total) by mouth daily. 30 tablet 0 10/30/2022 at 1000   Calcium Carb-Cholecalciferol (CALCIUM 500 + D PO) Take 1 tablet by mouth as directed. Take 1 tablet three times a week on Sunday, Wednesday and  Friday mornings.   10/30/2022 at 1000   Cholecalciferol 50 MCG (2000 UT) CAPS Take 2,000 Units by mouth daily.   10/30/2022 at 1000   diphenhydrAMINE (BENADRYL) 25 mg capsule Take 25 mg by mouth daily.      ELIQUIS 2.5 MG TABS tablet Take 2.5 mg by mouth 2 (two) times daily.   10/30/2022 at 2200   furosemide (LASIX) 40 MG tablet Take 40 mg by mouth daily.   10/30/2022 at 1000   isosorbide mononitrate (IMDUR) 30 MG 24 hr tablet Take 30 mg by mouth daily.   10/30/2022 at 1000   lisinopril (ZESTRIL) 40 MG tablet Take 40 mg by mouth daily.   10/30/2022 at 1000   Multiple Vitamins-Minerals (VITRUM SENIOR) TABS Take by mouth.   10/30/2022 at 1000   sertraline (ZOLOFT) 50 MG tablet Take 50 mg by mouth daily.   10/30/2022 at 1000   spironolactone (ALDACTONE) 25 MG tablet Take 1 tablet (25 mg total) by mouth daily. 30 tablet 0 10/30/2022 at 1000   aspirin EC  81 MG EC tablet Take 1 tablet (81 mg total) by mouth daily. Swallow whole. (Patient not taking: Reported on 10/31/2022) 30 tablet 0 Not Taking   metoprolol tartrate (LOPRESSOR) 25 MG tablet Take 0.5 tablets (12.5 mg total) by mouth 2 (two) times daily. (Patient not taking: Reported on 10/31/2022) 30 tablet 0 Not Taking    Assessment: Pharmacy consulted to dose heparin in this 89 year old female admitted with ACS/NSTEMI.  Pt was on Eliquis 2.5 mg PO BID, last dose on 2/16 @ 2200.  CrCl = 28.2 ml/min  2/17 1341 aPTT=73 Therapeutic x1 2/17 2100 aPTT=85 Therapeutic x 2 2/18 0351 aPTT=78,  HL = 0.84 2/19 0302 aPTT=> 200,  HL = 0.91 (suspect lab error) 2/19 0620 aPTT=91, HL = 0.37, therapeutic X 4 2/20 0407 HL 0.32, therapeutic x 5 2/21 0518 HL 0.18, subtherapeutic 2/21 1457 HL 0.38 2/21 2306 HL 0.39, therapeutic x 2  Goal of Therapy:  aPTT 66-102 Heparin level 0.3-0.7 units/ml Monitor platelets by anticoagulation protocol: Yes   Plan:   Continue heparin infusion at 750 units/hr  Recheck HL daily w/ AM labs while therapeutic CBC daily while on  heparin  Renda Rolls, PharmD, [facility] 11/04/2022 11:32 PM

## 2022-11-05 ENCOUNTER — Encounter: Payer: Self-pay | Admitting: Cardiology

## 2022-11-05 ENCOUNTER — Encounter
Admission: EM | Disposition: A | Discharge status: Home Health | Payer: Self-pay | Source: Home / Self Care | Attending: Hospitalist

## 2022-11-05 DIAGNOSIS — I200 Unstable angina: Secondary | ICD-10-CM | POA: Diagnosis not present

## 2022-11-05 HISTORY — PX: PACEMAKER LEADLESS INSERTION: EP1219

## 2022-11-05 SURGERY — PACEMAKER LEADLESS INSERTION
Anesthesia: Moderate Sedation

## 2022-11-05 MED ORDER — AMIODARONE HCL 200 MG PO TABS
200.0000 mg | ORAL_TABLET | Freq: Two times a day (BID) | ORAL | Status: DC
  Administered 2022-11-06 – 2022-11-07 (×3): 200 mg via ORAL
  Filled 2022-11-05 (×3): qty 1

## 2022-11-05 MED ORDER — HEPARIN SODIUM (PORCINE) 1000 UNIT/ML IJ SOLN
INTRAMUSCULAR | Status: AC
  Filled 2022-11-05: qty 10

## 2022-11-05 MED ORDER — SODIUM CHLORIDE 0.9% FLUSH: 3.0000 mL | INTRAVENOUS | Status: DC | PRN

## 2022-11-05 MED ORDER — SODIUM CHLORIDE 0.9% FLUSH
3.0000 mL | Freq: Two times a day (BID) | INTRAVENOUS | Status: DC
  Administered 2022-11-05 – 2022-11-07 (×4): 3 mL via INTRAVENOUS

## 2022-11-05 MED ORDER — FENTANYL CITRATE (PF) 100 MCG/2ML IJ SOLN
INTRAMUSCULAR | Status: AC
  Filled 2022-11-05: qty 2

## 2022-11-05 MED ORDER — HEPARIN (PORCINE) IN NACL 1000-0.9 UT/500ML-% IV SOLN
INTRAVENOUS | Status: DC | PRN
  Administered 2022-11-05 (×2): 500 mL

## 2022-11-05 MED ORDER — MIDAZOLAM HCL 2 MG/2ML IJ SOLN
INTRAMUSCULAR | Status: AC
  Filled 2022-11-05: qty 2

## 2022-11-05 MED ORDER — SODIUM CHLORIDE 0.9 % IV SOLN: 250.0000 mL | INTRAVENOUS | Status: DC | PRN

## 2022-11-05 MED ORDER — HEPARIN SODIUM (PORCINE) 1000 UNIT/ML IJ SOLN
INTRAMUSCULAR | Status: DC | PRN
  Administered 2022-11-05: 3000 [IU] via INTRAVENOUS

## 2022-11-05 MED ORDER — IOHEXOL 300 MG/ML  SOLN
INTRAMUSCULAR | Status: DC | PRN
  Administered 2022-11-05: 10 mL

## 2022-11-05 MED ORDER — APIXABAN 2.5 MG PO TABS
2.5000 mg | ORAL_TABLET | Freq: Two times a day (BID) | ORAL | Status: DC
  Administered 2022-11-06 – 2022-11-07 (×3): 2.5 mg via ORAL
  Filled 2022-11-05 (×3): qty 1

## 2022-11-05 MED ORDER — ACETAMINOPHEN 325 MG PO TABS: 650.0000 mg | ORAL_TABLET | ORAL | Status: DC | PRN

## 2022-11-05 MED ORDER — MIDAZOLAM HCL 2 MG/2ML IJ SOLN
INTRAMUSCULAR | Status: DC | PRN
  Administered 2022-11-05: .5 mg via INTRAVENOUS

## 2022-11-05 MED ORDER — ONDANSETRON HCL 4 MG/2ML IJ SOLN: 4.0000 mg | Freq: Four times a day (QID) | INTRAMUSCULAR | Status: DC | PRN

## 2022-11-05 MED ORDER — FENTANYL CITRATE (PF) 100 MCG/2ML IJ SOLN
INTRAMUSCULAR | Status: DC | PRN
  Administered 2022-11-05: 12.5 ug via INTRAVENOUS

## 2022-11-05 MED ORDER — HEPARIN (PORCINE) IN NACL 1000-0.9 UT/500ML-% IV SOLN
INTRAVENOUS | Status: AC
  Filled 2022-11-05: qty 1000

## 2022-11-05 MED ORDER — AMIODARONE HCL 200 MG PO TABS: 200.0000 mg | ORAL_TABLET | Freq: Every day | ORAL | Status: DC

## 2022-11-05 SURGICAL SUPPLY — 14 items
DILATOR VESSEL 38 20CM 12FR (INTRODUCER) IMPLANT
DILATOR VESSEL 38 20CM 14FR (INTRODUCER) IMPLANT
DILATOR VESSEL 38 20CM 18FR (INTRODUCER) IMPLANT
DILATOR VESSEL 38 20CM 8FR (INTRODUCER) IMPLANT
MICRA INTRODUCER SHEATH (SHEATH) ×1
NDL PERC 18GX7CM (NEEDLE) IMPLANT
NEEDLE PERC 18GX7CM (NEEDLE) ×1 IMPLANT
PACEMAKER LEADLESS AV2 MICRA (Pacemaker) IMPLANT
PACK CARDIAC CATH (CUSTOM PROCEDURE TRAY) ×1 IMPLANT
PAD ELECT DEFIB RADIOL ZOLL (MISCELLANEOUS) IMPLANT
SHEATH AVANTI 7FRX11 (SHEATH) IMPLANT
SHEATH INTRODUCER MICRA (SHEATH) IMPLANT
SUT SILK 0 FSL (SUTURE) IMPLANT
WIRE AMPLATZ SS-J .035X180CM (WIRE) IMPLANT

## 2022-11-05 NOTE — Progress Notes (Signed)
Physical Therapy Treatment Patient Details Name: Anne Mitchell MRN: [id number] DOB: [birth year] Today's Date: 11/05/2022   History of Present Illness 89yoF with a PMH of HF w/ recovered EF (55-60%, g1DD 10/2022, prev 25-30%, multiple rWMAs 2021), hx takotsubo CM, paroxysmal AF (eliquis), CKD 3 who presented to [facility] ED 10/31/22 with right sided chest pain that radiated to her shoulder. She was in AF RVR on presentation, converted to a junctional bradycardia on amiodarone + a 3 second sinus pause with conversion.  Pacemaker placed 2/22.    PT Comments    Pt did well with first bout of ambulation after pacer lpacement.  HR 65 on arrival, maintained in 80s t/o ambulation effort.  O2 remains in 90s on room air with slight drop, no DOE.  Pt with good overall tolerance and safety.  Continue with POC.   Recommendations for follow up therapy are one component of a multi-disciplinary discharge planning process, led by the attending physician.  Recommendations may be updated based on patient status, additional functional criteria and insurance authorization.  Follow Up Recommendations  Home health PT     Assistance Recommended at Discharge Intermittent Supervision/Assistance  Patient can return home with the following Assistance with cooking/housework;Assist for transportation   Equipment Recommendations  None recommended by PT    Recommendations for Other Services       Precautions / Restrictions Precautions Precautions: Fall Restrictions Weight Bearing Restrictions: No     Mobility  Bed Mobility Overal bed mobility: Modified Independent             General bed mobility comments: easily gets to sitting EOB    Transfers Overall transfer level: Needs assistance Equipment used: Rolling walker (2 wheels) Transfers: Sit to/from Stand Sit to Stand: Supervision           General transfer comment: more reliant on UEs to rise but never the less w/o assist or hesitation     Ambulation/Gait Ambulation/Gait assistance: Supervision Gait Distance (Feet): 125 Feet Assistive device: Rolling walker (2 wheels)         General Gait Details: Pt with safe, consistent, bout of ambulation. Appropriate light use of walker, O2 on room air dropped high 90s to low 90s, no SOB.  HR in 80 and stable t/o ambulation.   Stairs             Wheelchair Mobility    Modified Rankin (Stroke Patients Only)       Balance Overall balance assessment: Mild deficits observed, not formally tested                                          Cognition Arousal/Alertness: Awake/alert Behavior During Therapy: WFL for tasks assessed/performed Overall Cognitive Status: Within Functional Limits for tasks assessed                                          Exercises      General Comments General comments (skin integrity, edema, etc.): cleared by RN for post-procedure activity, >4hr bed rest      Pertinent Vitals/Pain Pain Assessment Pain Assessment:  (very minimal groin pain)    Home Living  Prior Function            PT Goals (current goals can now be found in the care plan section) Progress towards PT goals: Progressing toward goals    Frequency    Min 2X/week      PT Plan Current plan remains appropriate    Co-evaluation              AM-PAC PT "6 Clicks" Mobility   Outcome Measure  Help needed turning from your back to your side while in a flat bed without using bedrails?: None Help needed moving from lying on your back to sitting on the side of a flat bed without using bedrails?: None Help needed moving to and from a bed to a chair (including a wheelchair)?: None Help needed standing up from a chair using your arms (e.g., wheelchair or bedside chair)?: None Help needed to walk in hospital room?: A Little Help needed climbing 3-5 steps with a railing? : A Little 6 Click Score:  22    End of Session Equipment Utilized During Treatment: Gait belt Activity Tolerance: Patient tolerated treatment well Patient left: in chair;with call bell/phone within reach Nurse Communication: Mobility status PT Visit Diagnosis: Muscle weakness (generalized) (M62.81);Difficulty in walking, not elsewhere classified (R26.2)     Time: [id number] PT Time Calculation (min) (ACUTE ONLY): 26 min  Charges:  $Gait Training: 8-22 mins $Therapeutic Exercise: 8-22 mins                     Kreg Shropshire, DPT 11/05/2022, 5:23 PM

## 2022-11-05 NOTE — Progress Notes (Signed)
Report to PCU attempted, nurse unable to accept.

## 2022-11-05 NOTE — Progress Notes (Signed)
[place] NOTE       Patient ID: Anne Mitchell MRN: [id number] DOB/AGE: Sep 16, 1931 89 y.o.  Admit date: 10/31/2022 Referring Physician Dr. Judd Gaudier Primary Physician Dr. Kym Groom Primary Cardiologist Dr. Nehemiah Massed Reason for Consultation chest pain, bradycardia  HPI: Anne Mitchell is an 71yoF with a PMH of HF w/ recovered EF (55-60%, g1DD 10/2022, prev 25-30%, multiple rWMAs 2021), hx takotsubo CM, paroxysmal AF (eliquis), CKD 3 who presented to [facility] ED 10/31/22 with right sided chest pain that radiated to her shoulder. She was in AF RVR on presentation, converted to a junctional bradycardia on amiodarone + a 3 second sinus pause with conversion. She had hypotension requiring levophed the PM of 2/18 and intermittent right sided chest discomfort, responsive to NSAIDS. Continued paroxysms of AF RVR + a 9 second & several 2-3 second conversion pauses that she is symptomatic from. Successful Micra PPM implantation Thursday 2/22.   Interval History: - successful micra PPM placement this AM  - seen and examined in PCU following procedure, she feel well and is eager to sit up and eat lunch.  - mild soreness at R groin access, mild oozing on bandage from small hematoma - no chest pain, shortness of breath, or lightheadedness  Review of systems complete and found to be negative unless listed above     Past Medical History:  Diagnosis Date   Diastolic heart failure (HCC)    Emphysema (subcutaneous) (surgical) resulting from a procedure    Not correct   Emphysema lung ([place])    Hypertension     Past Surgical History:  Procedure Laterality Date   LEFT HEART CATH AND CORONARY ANGIOGRAPHY N/A 03/22/2020   Procedure: LEFT HEART CATH AND CORONARY ANGIOGRAPHY;  Surgeon: Corey Skains, MD;  Location: [facility];  Service: Cardiovascular;  Laterality: N/A;    Medications Prior to Admission  Medication Sig Dispense Refill Last Dose   albuterol (VENTOLIN HFA)  108 (90 Base) MCG/ACT inhaler Inhale 2 puffs into the lungs every 6 (six) hours as needed for wheezing or shortness of breath. 8 g 2 prn at prn   atorvastatin (LIPITOR) 80 MG tablet Take 1 tablet (80 mg total) by mouth daily. 30 tablet 0 10/30/2022 at 1000   Calcium Carb-Cholecalciferol (CALCIUM 500 + D PO) Take 1 tablet by mouth as directed. Take 1 tablet three times a week on Sunday, Wednesday and Friday mornings.   10/30/2022 at 1000   Cholecalciferol 50 MCG (2000 UT) CAPS Take 2,000 Units by mouth daily.   10/30/2022 at 1000   diphenhydrAMINE (BENADRYL) 25 mg capsule Take 25 mg by mouth daily.      ELIQUIS 2.5 MG TABS tablet Take 2.5 mg by mouth 2 (two) times daily.   10/30/2022 at 2200   furosemide (LASIX) 40 MG tablet Take 40 mg by mouth daily.   10/30/2022 at 1000   isosorbide mononitrate (IMDUR) 30 MG 24 hr tablet Take 30 mg by mouth daily.   10/30/2022 at 1000   lisinopril (ZESTRIL) 40 MG tablet Take 40 mg by mouth daily.   10/30/2022 at 1000   Multiple Vitamins-Minerals (VITRUM SENIOR) TABS Take by mouth.   10/30/2022 at 1000   sertraline (ZOLOFT) 50 MG tablet Take 50 mg by mouth daily.   10/30/2022 at 1000   spironolactone (ALDACTONE) 25 MG tablet Take 1 tablet (25 mg total) by mouth daily. 30 tablet 0 10/30/2022 at 1000   aspirin EC 81 MG EC tablet Take 1 tablet (81 mg  total) by mouth daily. Swallow whole. (Patient not taking: Reported on 10/31/2022) 30 tablet 0 Not Taking   metoprolol tartrate (LOPRESSOR) 25 MG tablet Take 0.5 tablets (12.5 mg total) by mouth 2 (two) times daily. (Patient not taking: Reported on 10/31/2022) 30 tablet 0 Not Taking   Social History   Socioeconomic History   Marital status: Unknown    Spouse name: Not on file   Number of children: Not on file   Years of education: Not on file   Highest education level: Not on file  Occupational History   Not on file  Tobacco Use   Smoking status: Never   Smokeless tobacco: Never  Substance and Sexual Activity   Alcohol  use: Never   Drug use: Never   Sexual activity: Not on file  Other Topics Concern   Not on file  Social History Narrative   Not on file   Social Determinants of Health   Financial Resource Strain: Not on file  Food Insecurity: No Food Insecurity (10/31/2022)   Hunger Vital Sign    Worried About Running Out of Food in the Last Year: Never true    Ran Out of Food in the Last Year: Never true  Transportation Needs: No Transportation Needs (10/31/2022)   PRAPARE - Hydrologist (Medical): No    Lack of Transportation (Non-Medical): No  Physical Activity: Not on file  Stress: Not on file  Social Connections: Not on file  Intimate Partner Violence: Not At Risk (10/31/2022)   Humiliation, Afraid, Rape, and Kick questionnaire    Fear of Current or Ex-Partner: No    Emotionally Abused: No    Physically Abused: No    Sexually Abused: No    Family History  Problem Relation Age of Onset   Colon cancer Mother    Hypertension Mother       Intake/Output Summary (Last 24 hours) at 11/05/2022 1340 Last data filed at 11/05/2022 1200 Gross per 24 hour  Intake 188.42 ml  Output 1700 ml  Net -1511.58 ml     Vitals:   11/05/22 1045 11/05/22 1100 11/05/22 1115 11/05/22 1155  BP: 105/77 (!) 126/49 (!) 127/56 137/61  Pulse: (!) 59 (!) 57 (!) 59 (!) 47  Resp: 20 (!) 22 (!) 24 16  Temp:    (!) 97.5 F (36.4 C)  TempSrc:    Oral  SpO2: 94% 95% 92% 100%  Weight:      Height:        PHYSICAL EXAM General: Pleasant elderly Caucasian female, well nourished, in no acute distress. Laying nearly flat in PCU bed HEENT:  Normocephalic and atraumatic.  Somewhat hard of hearing. Neck:  No JVD.  Lungs: Normal respiratory effort on oxygen by nasal cannula.  Decreased breath sounds bilaterally  heart: regular rate and rhythm. Normal S1 and S2 without gallops or murmurs.  Abdomen: Non-distended appearing.  Msk: Normal strength and tone for age. Extremities: Warm and well  perfused. No clubbing, cyanosis.  No peripheral edema. R groin without significant tenderness to palpation, mild oozing on gauze + tegaderm  Neuro: Alert and oriented X 3. Psych:  Answers questions appropriately.   Labs: Basic Metabolic Panel: Recent Labs    11/04/22 0518 11/04/22 2306  NA 134* 134*  K 4.6 4.3  CL 105 99  CO2 22 27  GLUCOSE 102* 135*  BUN 17 18  CREATININE 0.94 1.10*  CALCIUM 8.6* 8.8*  MG 2.2 2.0    Liver Function  Tests: No results for input(s): "AST", "ALT", "ALKPHOS", "BILITOT", "PROT", "ALBUMIN" in the last 72 hours. No results for input(s): "LIPASE", "AMYLASE" in the last 72 hours. CBC: Recent Labs    11/04/22 0518 11/04/22 2306  WBC 8.0 7.1  HGB 9.9* 10.8*  HCT 30.9* 33.0*  MCV 89.8 88.9  PLT 226 235    Cardiac Enzymes: No results for input(s): "CKTOTAL", "CKMB", "CKMBINDEX", "TROPONINIHS" in the last 72 hours.  BNP: No results for input(s): "BNP" in the last 72 hours.  D-Dimer: No results for input(s): "DDIMER" in the last 72 hours.  Hemoglobin A1C: No results for input(s): "HGBA1C" in the last 72 hours. Fasting Lipid Panel: No results for input(s): "CHOL", "HDL", "LDLCALC", "TRIG", "CHOLHDL", "LDLDIRECT" in the last 72 hours. Thyroid Function Tests: No results for input(s): "TSH", "T4TOTAL", "T3FREE", "THYROIDAB" in the last 72 hours.  Invalid input(s): "FREET3" Anemia Panel: No results for input(s): "VITAMINB12", "FOLATE", "FERRITIN", "TIBC", "IRON", "RETICCTPCT" in the last 72 hours.   Radiology: EP PPM/ICD IMPLANT  Result Date: 11/05/2022 Successful Micra AV 2 leadless pacemaker implantation   DG Chest Port 1 View  Result Date: 11/04/2022 CLINICAL DATA:  88 year old female with tachypnea. Planned pacemaker placement. EXAM: PORTABLE CHEST 1 VIEW COMPARISON:  CT Chest, Abdomen, and Pelvis 11/01/2022, and earlier. FINDINGS: Portable AP upright view at 0329 hours. Pacer or resuscitation pads project over the chest and upper  abdomen. Stable lung volumes and mediastinal contours. Coarse bilateral pulmonary interstitial opacity corresponding to recent CT lung findings. No pneumothorax, pleural effusion or consolidation. But pulmonary vascularity remains increased compared to portable chest 10/31/2022. Stable visualized osseous structures.  Negative visible bowel gas. IMPRESSION: 1. Possible pulmonary interstitial edema superimposed on chronic lung disease. 2. No pleural effusion or other acute cardiopulmonary abnormality. Electronically Signed   By: Genevie Ann M.D.   On: 11/04/2022 04:04   CT CHEST ABDOMEN PELVIS WO CONTRAST  Result Date: 11/01/2022 CLINICAL DATA:  Hypotension.  Evaluate for possible aneurysm EXAM: CT CHEST, ABDOMEN AND PELVIS WITHOUT CONTRAST TECHNIQUE: Multidetector CT imaging of the chest, abdomen and pelvis was performed following the standard protocol without IV contrast. RADIATION DOSE REDUCTION: This exam was performed according to the departmental dose-optimization program which includes automated exposure control, adjustment of the mA and/or kV according to patient size and/or use of iterative reconstruction technique. COMPARISON:  Chest radiograph dated 11/01/2022. FINDINGS: Evaluation of this exam is limited in the absence of intravenous contrast. CT CHEST FINDINGS Cardiovascular: There is no cardiomegaly or pericardial effusion. There is advanced 3 vessel coronary vascular calcification. Moderate atherosclerotic calcification of the thoracic aorta. No aneurysmal dilatation. The central pulmonary arteries are grossly unremarkable on this noncontrast CT. Mediastinum/Nodes: No hilar or mediastinal adenopathy. The esophagus is grossly unremarkable. No mediastinal fluid collection. Lungs/Pleura: Scattered pulmonary nodules measure up to 5 mm in the left upper lobe. Findings may be related to underlying granulomatous disease such as sarcoid or other inflammatory processes. Clinical correlation is recommended. There  is a focal area of pleural thickening with associated reticulation in the anterior right middle lobe, likely scarring. There is trace bilateral pleural effusions. No focal consolidation or pneumothorax. The central airways are patent. Musculoskeletal: Osteopenia with degenerative changes of the spine. No acute osseous pathology. CT ABDOMEN PELVIS FINDINGS No intra-abdominal free air or free fluid. Hepatobiliary: The liver is unremarkable. No biliary dilatation. The gallbladder is unremarkable. Pancreas: Mild haziness of the fat adjacent to the head and uncinate process of the pancreas. Correlation with pancreatic enzymes recommended to exclude pancreatitis.  No fluid collection or abscess. Spleen: Normal in size without focal abnormality. Adrenals/Urinary Tract: The adrenal glands are unremarkable. Mild bilateral renal parenchyma atrophy. There is no hydronephrosis or nephrolithiasis on either side. There is a 5 cm right renal inferior pole cyst. The visualized ureters and urinary bladder appear unremarkable. Stomach/Bowel: There is severe sigmoid diverticulosis without active inflammatory changes. There is no bowel obstruction or active inflammation. The appendix is not visualized with certainty. No inflammatory changes identified in the right lower quadrant. Vascular/Lymphatic: Advanced aortoiliac atherosclerotic disease. No aneurysmal dilatation. The IVC is unremarkable. No portal venous gas. There is no adenopathy. Reproductive: Hysterectomy. The right ovary is not identified with certainty. Left ovarian cyst measure up to 2.5 cm. No follow-up imaging recommended. Note: This recommendation does not apply to premenarchal patients and to those with increased risk (genetic, family history, elevated tumor markers or other high-risk factors) of ovarian cancer. Reference: JACR 2020 Feb; 17(2):248-254 Other: None Musculoskeletal: Osteopenia with multilevel degenerative changes of the spine. No acute osseous pathology.  IMPRESSION: 1. No aortic aneurysm. 2. Trace bilateral pleural effusions. 3. Scattered pulmonary nodules measure up to 5 mm in the left upper lobe. Findings may be related to underlying granulomatous disease such as sarcoid or other inflammatory processes. Follow-up with chest CT in 6 months recommended. 4. Mild haziness of the fat adjacent to the head and uncinate process of the pancreas. Correlation with pancreatic enzymes recommended to exclude pancreatitis. No fluid collection or abscess. 5. Severe sigmoid diverticulosis without active inflammatory changes. No bowel obstruction. 6.  Aortic Atherosclerosis (ICD10-I70.0). Electronically Signed   By: Anner Crete M.D.   On: 11/01/2022 22:22   DG Chest Port 1 View  Result Date: 11/01/2022 CLINICAL DATA:  Dyspnea EXAM: PORTABLE CHEST 1 VIEW COMPARISON:  10/31/2022 FINDINGS: Cardiac shadow is prominent but stable from the prior exam. Diffuse interstitial changes are again identified although increased central vascular congestion is now noted. No edema is seen. No bony abnormality is noted. IMPRESSION: New superimposed vascular congestion is now seen overlying the previously seen chronic interstitial changes. Electronically Signed   By: Inez Catalina M.D.   On: 11/01/2022 18:46   [place] EKG SITE RITE  Result Date: 11/01/2022 If Site Rite image not attached, placement could not be confirmed due to current cardiac rhythm.  ECHOCARDIOGRAM COMPLETE  Result Date: 11/01/2022    ECHOCARDIOGRAM REPORT   Patient Name:   Anne Mitchell Date of Exam: 11/01/2022 Medical Rec #:  [id number]      Height:       61.0 in Accession #:    [id number]     Weight:       114.2 lb Date of Birth:  [birth year]      BSA:          1.488 m Patient Age:    22 years       BP:           99/45 mmHg Patient Gender: F              HR:           139 bpm. Exam Location:  [facility] Procedure: 2D Echo, Color Doppler, Cardiac Doppler and Intracardiac            Opacification Agent Indications:     Chest  Pain R07.9  History:         Patient has prior history of Echocardiogram examinations. DHF,  Emphysema; Risk Factors:Hypertension.  Sonographer:     L. Thornton-Maynard Referring Phys:  [id number] TINA LAI Diagnosing Phys: Yolonda Kida MD IMPRESSIONS  1. AFIB during study.  2. Left ventricular ejection fraction, by estimation, is 55 to 60%. The left ventricle has normal function. The left ventricle has no regional wall motion abnormalities. Left ventricular diastolic parameters are consistent with Grade I diastolic dysfunction (impaired relaxation).  3. Right ventricular systolic function is normal. The right ventricular size is normal. There is normal pulmonary artery systolic pressure.  4. The mitral valve is normal in structure. Trivial mitral valve regurgitation.  5. The aortic valve is normal in structure. Aortic valve regurgitation is not visualized. FINDINGS  Left Ventricle: Left ventricular ejection fraction, by estimation, is 55 to 60%. The left ventricle has normal function. The left ventricle has no regional wall motion abnormalities. Definity contrast agent was given IV to delineate the left ventricular  endocardial borders. The left ventricular internal cavity size was normal in size. There is borderline concentric left ventricular hypertrophy. Left ventricular diastolic parameters are consistent with Grade I diastolic dysfunction (impaired relaxation). Right Ventricle: The right ventricular size is normal. No increase in right ventricular wall thickness. Right ventricular systolic function is normal. There is normal pulmonary artery systolic pressure. The tricuspid regurgitant velocity is 2.44 m/s, and  with an assumed right atrial pressure of 3 mmHg, the estimated right ventricular systolic pressure is 0000000 mmHg. Left Atrium: Left atrial size was normal in size. Right Atrium: Right atrial size was normal in size. Pericardium: There is no evidence of pericardial effusion. Mitral  Valve: The mitral valve is normal in structure. Trivial mitral valve regurgitation. Tricuspid Valve: The tricuspid valve is normal in structure. Tricuspid valve regurgitation is trivial. Aortic Valve: The aortic valve is normal in structure. Aortic valve regurgitation is not visualized. Aortic valve mean gradient measures 4.0 mmHg. Aortic valve peak gradient measures 6.2 mmHg. Aortic valve area, by VTI measures 1.77 cm. Pulmonic Valve: The pulmonic valve was normal in structure. Pulmonic valve regurgitation is not visualized. Aorta: The ascending aorta was not well visualized. IAS/Shunts: No atrial level shunt detected by color flow Doppler. Additional Comments: AFIB during study.  LEFT VENTRICLE PLAX 2D LVIDd:         3.20 cm     Diastology LVIDs:         2.30 cm     LV e' medial:    4.68 cm/s LV PW:         1.00 cm     LV E/e' medial:  14.4 LV IVS:        1.60 cm     LV e' lateral:   5.22 cm/s LVOT diam:     1.70 cm     LV E/e' lateral: 13.0 LV SV:         28 LV SV Index:   19 LVOT Area:     2.27 cm  LV Volumes (MOD) LV vol d, MOD A2C: 21.2 ml LV vol d, MOD A4C: 32.3 ml LV vol s, MOD A2C: 9.6 ml LV vol s, MOD A4C: 9.6 ml LV SV MOD A2C:     11.6 ml LV SV MOD A4C:     32.3 ml LV SV MOD BP:      16.2 ml RIGHT VENTRICLE RV S prime:     9.03 cm/s LEFT ATRIUM             Index        RIGHT  ATRIUM          Index LA diam:        3.70 cm 2.49 cm/m   RA Area:     8.51 cm LA Vol (A2C):   32.0 ml 21.50 ml/m  RA Volume:   13.30 ml 8.94 ml/m LA Vol (A4C):   36.6 ml 24.59 ml/m LA Biplane Vol: 34.5 ml 23.18 ml/m  AORTIC VALVE                    PULMONIC VALVE AV Area (Vmax):    1.87 cm     PV Vmax:       1.05 m/s AV Area (Vmean):   1.75 cm     PV Peak grad:  4.4 mmHg AV Area (VTI):     1.77 cm AV Vmax:           124.00 cm/s AV Vmean:          97.050 cm/s AV VTI:            0.160 m AV Peak Grad:      6.2 mmHg AV Mean Grad:      4.0 mmHg LVOT Vmax:         102.20 cm/s LVOT Vmean:        74.750 cm/s LVOT VTI:           0.125 m LVOT/AV VTI ratio: 0.78  AORTA Ao Root diam: 2.90 cm Ao Asc diam:  3.50 cm MITRAL VALVE               TRICUSPID VALVE MV Area (PHT): 11.49 cm   TR Peak grad:   23.8 mmHg MV Decel Time: 66 msec     TR Vmax:        244.00 cm/s MV E velocity: 67.60 cm/s                            SHUNTS                            Systemic VTI:  0.12 m                            Systemic Diam: 1.70 cm Yolonda Kida MD Electronically signed by Yolonda Kida MD Signature Date/Time: 11/01/2022/2:30:23 PM    Final    DG Shoulder Right  Result Date: 10/31/2022 CLINICAL DATA:  Right shoulder pain. EXAM: RIGHT SHOULDER - 2+ VIEW COMPARISON:  Chest radiograph dated 10/31/2022. FINDINGS: No acute fracture or dislocation the bones are osteopenic. Mild degenerative changes of the right shoulder. Rounded osseous density over the humeral neck may represent loose bodies. The soft tissues are unremarkable. IMPRESSION: 1. No acute fracture or dislocation. 2. Mild degenerative changes of the right shoulder. Electronically Signed   By: Anner Crete M.D.   On: 10/31/2022 02:22   DG Chest 2 View  Result Date: 10/31/2022 CLINICAL DATA:  Chest pain. EXAM: CHEST - 2 VIEW COMPARISON:  Chest radiograph dated 09/16/2022. FINDINGS: There is diffuse chronic interstitial coarsening. Mild elevation of the right hemidiaphragm. No focal consolidation, pleural effusion or pneumothorax. The cardiac silhouette is within limits. Osteopenia with degenerative changes of the spine. No acute osseous pathology. IMPRESSION: 1. No acute cardiopulmonary process. 2. Chronic interstitial coarsening. Electronically Signed   By: Anner Crete M.D.   On: 10/31/2022 02:21  [place] 2021 Prox RCA lesion is 60% stenosed. Mid RCA lesion is 30% stenosed. Ost Cx to Prox Cx lesion is 75% stenosed. Prox Cx lesion is 65% stenosed. Prox LAD lesion is 30% stenosed. Mid LAD lesion is 20% stenosed.   89 year old female with known coronary atherosclerosis  hypertension hyperlipidemia paroxysmal nonvalvular atrial fibrillation having acute systolic dysfunction congestive heart failure elevated troponin with apical ballooning by echocardiogram consistent with stress-induced cardiomyopathy rather than acute coronary syndrome   Apical ballooning with ejection fraction of 25 to 30%   75% stenosis of ostial left circumflex artery 60% stenosis of right coronary artery Mild atherosclerosis of left anterior descending artery   Assessment Apical ballooning and/or stress-induced cardiomyopathy with minimal elevation of troponin more consistent with heart failure rather than acute coronary syndrome   Plan Medical management including Lasix beta-blocker spironolactone and ACE inhibitor for cardiomyopathy and congestive heart failure No further cardiac intervention at this time Risk factor modification with high intensity cholesterol therapy Antiplatelet therapy Rehabilitation  ECHO EF (55-60%, g1DD 10/2022, prev 25-30%, multiple rWMAs 2021)  TELEMETRY reviewed by me (LT) 11/05/2022 : NSR 60s-70s  EKG reviewed by me: AF RVR rate 160s  Data reviewed by me (LT) 11/05/2022:  hospitalist progress note, last 24h labs, imaging, vitals tele    Principal Problem:   Unstable angina ([place]) Active Problems:   Essential hypertension   Chronic anticoagulation   Rapid atrial fibrillation (HCC)   Chest pain   Coronary artery disease   Bradyarrhythmia   Atrial fibrillation with rapid ventricular response ([place])    ASSESSMENT AND PLAN:  Anne Mitchell is an 18yoF with a PMH of HF w/ recovered EF (55-60%, g1DD 10/2022, prev 25-30%, multiple rWMAs 2021), hx takotsubo CM, paroxysmal AF (eliquis), CKD 3 who presented to [facility] ED 10/31/22 with right sided chest pain that radiated to her shoulder. She was in AF RVR on presentation, converted to a junctional bradycardia on amiodarone + a 3 second sinus pause with conversion. She had hypotension requiring levophed the PM of  2/18 and intermittent right sided chest discomfort, responsive to NSAIDS. Continued paroxysms of AF RVR + a 9 second & several 2-3 second conversion pauses that she is symptomatic from. Successful Micra PPM implantation Thursday 2/22.   # paroxysmal AF RVR  # symptomatic bradycardia  # s/p Micra leadless PPM implantation  Presented with atrial fibrillation with RVR with rates as high as 160s, initially on diltiazem and amiodarone infusions for rate and rhythm control.  Eventually converted to a junctional bradycardia with a ~9 second conversion pause, s/p PPM 2/22 -s/p metoprolol tartrate 12.'5mg'$  x1 for rate control while in AF.  -Continue p.o. amiodarone 200 mg twice daily to complete 14 days, then '200mg'$  daily therafter. -Monitor and replete electrolytes for a K >4, mag >2 -stop heparin. Resume Eliquis 2.'5mg'$  BID tomorrow AM (wt <60kg & age >67).  -discussed aftercare instructions with the patient, written instructions will be placed in discharge packet -Anticipate readiness for discharge from a cardiac perspective tomorrow, 2/23  # non-obstructive CAD + chest pain with typical and atypical features # demand ischemia  Reported chest pressure and "pulsations" on the right side of her chest radiating to her neck and down her shoulder that occurred at rest initially prompting her presentation, in AF RVR on admission as above. Some response to NSAIDS. Troponins elevated and trending 31, 39, 127, 103, likely demand/supply mismatch and not ACS without ischemic EKG changes and echo is without RWMA's and actually shows recovered EF compared  to 2021.   -cancelled [place] 2/21 w/ symptomatic bradycardia + hypotension after conversion from AF RVR to junctional brady as above. -- consider further eval outpatient if patient develops recurrent chest pain   # acute on chronic HF  Recovered EF at 55-60% with g1dd, BNP elevated at 700, suspect decompensation in the setting of AF RVR. Remains on  supplemental O2. Repeat CXR 2/21 with vascular congestion - s/p IV lasix '20mg'$  x1, give another '40mg'$  IV around 12p today. - no BB with baseline bradycardia. GDMT reinitiation limited by hypotension. Off levophed entirely since 2/19. S/p 2L IVF for hypotension - previously prescribed lisinopril 20 mg every other day, Imdur 30 mg daily, spironolactone 25 mg once daily, and Lasix 40 mg once daily  # CKD 3  Renal function normalized following IVF.   This patient's plan of care was discussed and created with Dr. Saralyn Pilar and he is in agreement.  Signed: Tristan Schroeder , PA-C 11/05/2022, 1:40 PM [facility]

## 2022-11-05 NOTE — Progress Notes (Addendum)
PROGRESS NOTE    Anne Mitchell  [id number] DOB: 02-12-1932 DOA: 10/31/2022 PCP: Valera Castle, MD  248A/[id number]-AA  LOS: 4 days   Brief hospital course:   Assessment & Plan: Anne Mitchell is a 89 y.o. 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    On arrival of EMS heart rate was in the 140s and she was in A-fib.   Afib w RVR Junctional bradycardia  Patient converted to junctional bradycardia in the 50s after diltiazem bolus and infusion.  Rate control agents held.  Pt went into Afib RVR again morning of 2/18, with BP dropping.  Pt then was started amiodarone bolus+gtt. --cardiology consulted Plan: --cont amiodarone as oral 200 mg BID --pacemaker placement today --resume home Eliquis tomorrow  Hypotension --developed hypotension morning of 2/18 with BP down in 70's, due to Afib RVR.  Received 2L IVF and on pressor briefly. --BP currently stable  Chest pain --seems atypical, right-sided, seemed to improve with IV toradol --completed 72 hours of heparin gtt. --stress test was not completed due to symptomatic bradycardia --cont Imdur  --IV toradol PRN for pain  Trop elevation 2/2 Type 2 MI --Likely demand from RVR. --further workup per cardio  CAD, history of NSTEMI 2021 History of stress cardiomyopathy 2021 Patient with history of multiple vessel but nonobstructive CAD on cath 2021 --cont statin --further workup per cardio as outpatient  AKI CKD 3 ruled out --Cr 1.04 on presentation, up to 1.7  --AKI resolved with IVF  Acute on chronic diastolic CHF --Recovered EF at 55-60% with g1dd, BNP elevated at 700, suspect decompensation in the setting of AF RVR and also  having received 2L IVF while hypotensive.  CXR showed New superimposed vascular congestion. --s/p IV lasix 20 and 40. --diuresis per cardio   DVT prophylaxis: [id number] Code Status: Full code  Family Communication:  Level of care: Progressive Dispo:   The patient is from: home Anticipated d/c is to: home Anticipated d/c date is: friday   Subjective and Interval History:  Pt received pacemaker placemen today, tolerated it well.  Pt reported feeling good today.     Objective: Vitals:   11/05/22 1100 11/05/22 1115 11/05/22 1155 11/05/22 1659  BP: (!) 126/49 (!) 127/56 137/61 (!) 121/50  Pulse: (!) 57 (!) 59 (!) 47 70  Resp: (!) 22 (!) 24 16 16  $ Temp:   (!) 97.5 F (36.4 C) 98.2 F (36.8 C)  TempSrc:   Oral   SpO2: 95% 92% 100% 95%  Weight:      Height:        Intake/Output Summary (Last 24 hours) at 11/05/2022 1721 Last data filed at 11/05/2022 1500 Gross per 24 hour  Intake 246.42 ml  Output 950 ml  Net -703.58 ml   Filed Weights   11/03/22 0405 11/04/22 0500 11/05/22 0500  Weight: 55.3 kg 55.6 kg 55.2 kg    Examination:   Constitutional: NAD, AAOx3 HEENT: conjunctivae and lids normal, EOMI CV: No cyanosis.   RESP: normal respiratory effort, on RA Extremities: No effusions, edema in BLE SKIN: warm, dry Neuro: II - XII grossly intact.   Psych: Normal mood and affect.  Appropriate judgement and reason   Data Reviewed: I have personally review ed  labs and imaging studies  Time spent: 25 minutes   Enzo Bi, MD [facility] If 7PM-7AM, please contact night-coverage 11/05/2022, 5:21 PM

## 2022-11-05 NOTE — Progress Notes (Signed)
Report given.

## 2022-11-06 DIAGNOSIS — I200 Unstable angina: Secondary | ICD-10-CM | POA: Diagnosis not present

## 2022-11-06 DIAGNOSIS — R079 Chest pain, unspecified: Secondary | ICD-10-CM

## 2022-11-06 LAB — CBC
HCT: 31.8 % — ABNORMAL LOW (ref 36.0–46.0)
Hemoglobin: 10.5 g/dL — ABNORMAL LOW (ref 12.0–15.0)
MCH: 29.1 pg (ref 26.0–34.0)
MCHC: 33 g/dL (ref 30.0–36.0)
MCV: 88.1 fL (ref 80.0–100.0)
Platelets: 234 10*3/uL (ref 150–400)
RBC: 3.61 MIL/uL — ABNORMAL LOW (ref 3.87–5.11)
RDW: 14.8 % (ref 11.5–15.5)
WBC: 8.8 10*3/uL (ref 4.0–10.5)
nRBC: 0 % (ref 0.0–0.2)

## 2022-11-06 LAB — BASIC METABOLIC PANEL
Anion gap: 11 (ref 5–15)
BUN: 16 mg/dL (ref 8–23)
CO2: 24 mmol/L (ref 22–32)
Calcium: 8.8 mg/dL — ABNORMAL LOW (ref 8.9–10.3)
Chloride: 98 mmol/L (ref 98–111)
Creatinine, Ser: 0.87 mg/dL (ref 0.44–1.00)
GFR, Estimated: >60 mL/min (ref >60)
Glucose, Bld: 96 mg/dL (ref 70–99)
Potassium: 4.1 mmol/L (ref 3.5–5.1)
Sodium: 133 mmol/L — ABNORMAL LOW (ref 135–145)

## 2022-11-06 LAB — MAGNESIUM: Magnesium: 1.7 mg/dL (ref 1.7–2.4)

## 2022-11-06 MED ORDER — METOPROLOL TARTRATE 5 MG/5ML IV SOLN
5.0000 mg | Freq: Once | INTRAVENOUS | Status: AC
  Administered 2022-11-06: 5 mg via INTRAVENOUS

## 2022-11-06 MED ORDER — METOPROLOL TARTRATE 5 MG/5ML IV SOLN
INTRAVENOUS | Status: AC
  Filled 2022-11-06: qty 5

## 2022-11-06 MED ORDER — METOPROLOL TARTRATE 25 MG PO TABS
12.5000 mg | ORAL_TABLET | Freq: Two times a day (BID) | ORAL | Status: DC
  Administered 2022-11-06 – 2022-11-07 (×3): 12.5 mg via ORAL
  Filled 2022-11-06 (×4): qty 1

## 2022-11-06 NOTE — Plan of Care (Signed)
  Problem: Education: Goal: Understanding of cardiac disease, CV risk reduction, and recovery process will improve Outcome: Progressing Goal: Individualized Educational Video(s) Outcome: Progressing   Problem: Activity: Goal: Ability to tolerate increased activity will improve Outcome: Progressing   Problem: Cardiac: Goal: Ability to achieve and maintain adequate cardiovascular perfusion will improve Outcome: Progressing   Problem: Health Behavior/Discharge Planning: Goal: Ability to safely manage health-related needs after discharge will improve Outcome: Progressing   Problem: Education: Goal: Knowledge of General Education information will improve Description: Including pain rating scale, medication(s)/side effects and non-pharmacologic comfort measures Outcome: Progressing   Problem: Health Behavior/Discharge Planning: Goal: Ability to manage health-related needs will improve Outcome: Progressing   Problem: Clinical Measurements: Goal: Ability to maintain clinical measurements within normal limits will improve Outcome: Progressing Goal: Will remain free from infection Outcome: Progressing Goal: Diagnostic test results will improve Outcome: Progressing Goal: Respiratory complications will improve Outcome: Progressing Goal: Cardiovascular complication will be avoided Outcome: Progressing   Problem: Activity: Goal: Risk for activity intolerance will decrease Outcome: Progressing   Problem: Nutrition: Goal: Adequate nutrition will be maintained Outcome: Progressing   Problem: Coping: Goal: Level of anxiety will decrease Outcome: Progressing   Problem: Elimination: Goal: Will not experience complications related to bowel motility Outcome: Progressing Goal: Will not experience complications related to urinary retention Outcome: Progressing   Problem: Pain Managment: Goal: General experience of comfort will improve Outcome: Progressing   Problem:  Safety: Goal: Ability to remain free from injury will improve Outcome: Progressing   Problem: Skin Integrity: Goal: Risk for impaired skin integrity will decrease Outcome: Progressing   Problem: Education: Goal: Understanding of CV disease, CV risk reduction, and recovery process will improve Outcome: Progressing Goal: Individualized Educational Video(s) Outcome: Progressing   Problem: Activity: Goal: Ability to return to baseline activity level will improve Outcome: Progressing   Problem: Cardiovascular: Goal: Ability to achieve and maintain adequate cardiovascular perfusion will improve Outcome: Progressing Goal: Vascular access site(s) Level 0-1 will be maintained Outcome: Progressing   Problem: Health Behavior/Discharge Planning: Goal: Ability to safely manage health-related needs after discharge will improve Outcome: Progressing   Problem: Education: Goal: Knowledge of cardiac device and self-care will improve Outcome: Progressing Goal: Ability to safely manage health related needs after discharge will improve Outcome: Progressing Goal: Individualized Educational Video(s) Outcome: Progressing   Problem: Cardiac: Goal: Ability to achieve and maintain adequate cardiopulmonary perfusion will improve Outcome: Progressing

## 2022-11-06 NOTE — Progress Notes (Signed)
PROGRESS NOTE    Anne Mitchell  [id number] DOB: [birth year] DOA: 10/31/2022 PCP: Valera Castle, MD  248A/[id number]-AA  LOS: 5 days   Brief hospital course:   Assessment & Plan: Anne Mitchell is a 89 y.o. 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    On arrival of EMS heart rate was in the 140s and she was in A-fib.   Afib w RVR Junctional bradycardia  S/p pacemaker placement on 2/22 Patient converted to junctional bradycardia in the 50s after initial diltiazem bolus and infusion.  Rate control agents held.  Pt went into Afib RVR again morning of 2/18, with BP dropping.  Pt then was started amiodarone bolus+gtt. --cardiology consulted Plan: -Continue p.o. amiodarone 200 mg twice daily to complete 14 days, then '200mg'$  daily therafter.  --start Lopressor 12.5 mg BID for rate control --cont Eliquis --follow up in clinic with Dr. Saralyn Pilar in 1-2 weeks.     Hypotension --developed hypotension morning of 2/18 with BP down in 70's, due to Afib RVR.  Received 2L IVF and on pressor briefly. --BP currently stable --hold home BP meds.  Chest pain --seems atypical, right-sided, seemed to improve with IV toradol --completed 72 hours of heparin gtt. --stress test was not completed due to symptomatic bradycardia --cont Imdur  --IV toradol PRN for pain  Trop elevation 2/2 Type 2 MI --Likely demand from RVR. --further workup per cardio  CAD, history of NSTEMI 2021 History of stress cardiomyopathy 2021 Patient with history of multiple vessel but nonobstructive CAD on cath 2021 --cont statin -- consider further eval outpatient if patient develops recurrent chest pain   AKI CKD 3 ruled  out --Cr 1.04 on presentation, up to 1.7  --AKI resolved with IVF  Acute on chronic diastolic CHF --Recovered EF at 55-60% with g1dd, BNP elevated at 700, suspect decompensation in the setting of AF RVR and also having received 2L IVF while hypotensive.  CXR showed New superimposed vascular congestion. --s/p IV lasix 20 and 40. --diuresis per cardio   DVT prophylaxis: [id number] Code Status: Full code  Family Communication:  Level of care: Progressive Dispo:   The patient is from: home Anticipated d/c is to: home Anticipated d/c date is: tomorrow   Subjective and Interval History:  Pt went into afib RVR again this morning, prompting a rapid call.  Rate controlled with just IV metop x1.  Pt was in no distress.   Objective: Vitals:   11/06/22 0816 11/06/22 0927 11/06/22 1100 11/06/22 1534  BP: (!) 107/51 (!) 118/57 (!) 103/58 121/79  Pulse: 60  (!) 55 92  Resp:   18 18  Temp:   98.5 F (36.9 C) 98.7 F (37.1 C)  TempSrc:      SpO2: 93%  95% 91%  Weight:      Height:        Intake/Output Summary (Last 24 hours) at 11/06/2022 1554 Last data filed at 11/06/2022 0800 Gross per 24 hour  Intake 120 ml  Output 700 ml  Net -580 ml   Filed Weights   11/03/22 0405 11/04/22 0500 11/05/22 0500  Weight: 55.3 kg 55.6 kg 55.2 kg    Examination:   Constitutional: NAD, AAOx3 HEENT: conjunctivae and lids normal, EOMI CV:  No cyanosis.   RESP: normal respiratory effort, on RA Neuro: II - XII grossly intact.     Data Reviewed: I have personally review ed labs and imaging studies  Time spent: 35 minutes   Enzo Bi, MD [facility] If 7PM-7AM, please contact night-coverage 11/06/2022, 3:54 PM

## 2022-11-06 NOTE — Progress Notes (Signed)
Nutrition Follow-up  DOCUMENTATION CODES:   Not applicable  INTERVENTION:   -Liberalize diet to 2 gram sodium for wider variety of meal selections -MVI with minerals daily -Continue Ensure Enlive po BID, each supplement provides 350 kcal and 20 grams of protein.   NUTRITION DIAGNOSIS:   Increased nutrient needs related to catabolic illness (Emphysema, CHF) as evidenced by estimated needs.  Ongoing  GOAL:   Patient will meet greater than or equal to 90% of their needs  Progressing   MONITOR:   PO intake, Supplement acceptance, Labs, Weight trends, I & O's, Skin  REASON FOR ASSESSMENT:   Malnutrition Screening Tool    ASSESSMENT:   89 y/o female with h/o breast cancer, HTN, PAF, CHF, emphysema and NSTEMI who is admitted with unstable angina.  Reviewed I/O's: -65 ml x 24 hours and -99 ml since admission  UOP: 200 ml x 24 hours   Pt lying in bed at time of visit. She was asleep and did not respond to voice. Noted pt consumed 50% of her omelette today. Pt is currently on a heart healthy duet and is not consistent with drinking Ensure supplements. She refused this morning's dose.   Per MD notes, plan to discharge home today.   No wt loss noted since admission.   Labs reviewed: Na: 133.    Diet Order:   Diet Order             Diet Heart Room service appropriate? Yes; Fluid consistency: Thin  Diet effective now                   EDUCATION NEEDS:   Education needs have been addressed  Skin:  Skin Assessment: Reviewed RN Assessment  Last BM:  11/06/22  Height:   Ht Readings from Last 1 Encounters:  10/31/22 '5\' 1"'$  (1.549 m)    Weight:   Wt Readings from Last 1 Encounters:  11/05/22 55.2 kg    Ideal Body Weight:  47.7 kg  BMI:  Body mass index is 22.99 kg/m.  Estimated Nutritional Needs:   Kcal:  1400-1600  Protein:  70-85 grams  Fluid:  >1.4 L    Loistine Chance, RD, LDN, [place] Registered Dietitian II Certified Diabetes Care and  Education Specialist Please refer to [facility] for RD and/or RD on-call/weekend/after hours pager

## 2022-11-06 NOTE — Discharge Instructions (Signed)
Please avoid showering/submerging yourself in water for the next week. If your bandage gets wet or starts to fall off, replace it with another piece of gauze and the Tegaderm (clear bandage I provided). You should try to keep it covered for a week. You will follow up with Dr. Saralyn Pilar in the office in about 1 week. If you have bleeding from the site, lie down and apply firm pressure for 20 minutes, if it continues to bleed, call our office [phone]). Avoid heavy lifting, squatting (other than sitting) and strenuous activity for 1 week. You will get a call from [employer] to set up your pacemaker and the [employer] device. You do not need to bring this device with you to appointments. It is used to monitor your pacemaker from home.

## 2022-11-06 NOTE — Care Management Important Message (Signed)
Important Message  Patient Details  Name: Anne Mitchell MRN: [id number] Date of Birth: October 11, 1931   Medicare Important Message Given:  Yes  Patient asleep upon time of visit.  Copy of Medicare IM left in room on windowsill for reference.   Dannette Barbara 11/06/2022, 11:35 AM

## 2022-11-06 NOTE — Progress Notes (Signed)
Rapid Response Event Note   Reason for Call : Patient varying heart rate, sinus brady to sinus tachycardia post pacemaker placement on 11/05/22   Initial Focused Assessment: Patient alert, oriented, warm and dry. VS stable.       Interventions: EKG at bedside, contact cardiology and Dr. Billie Ruddy. Awaiting new orders.    Plan of Care:  [id number]:Will check back in with bedside RN, no need for transfer to higher level of care at this time.   0830: check back with primary RN, patient doing well, up to [facility], no new issues.  Event Summary:   MD Notified: Dr. Billie Ruddy and Alain Marion, PA Call Time: [place] End Time: [place], RN

## 2022-11-06 NOTE — Progress Notes (Signed)
[place] NOTE       Patient ID: [name] MRN: [id number] DOB/AGE: [birth year] 89 y.o.  Admit date: 10/31/2022 Referring Physician Dr. Judd Gaudier Primary Physician Dr. Kym Groom Primary Cardiologist Dr. Nehemiah Massed Reason for Consultation chest pain, bradycardia  HPI: Anne Mitchell is an 89yoF with a PMH of HF w/ recovered EF (55-60%, g1DD 10/2022, prev 25-30%, multiple rWMAs 2021), hx takotsubo CM, paroxysmal AF (eliquis), CKD 3 who presented to [facility] ED 10/31/22 with right sided chest pain that radiated to her shoulder. She was in AF RVR on presentation, converted to a junctional bradycardia on amiodarone + a 3 second sinus pause with conversion. She had hypotension requiring levophed the PM of 2/18 and intermittent right sided chest discomfort, responsive to NSAIDS. Continued paroxysms of AF RVR + a 9 second & several 2-3 second conversion pauses that she is symptomatic from. Successful Micra PPM implantation Thursday 2/22.   Interval History: -converted back to AF RVR early AM, feels like her legs are jumpy but no chest pain or palpitations.  - gave IV metoprolol '5mg'$  x1 and converted back to NSR / paced rhythm - suture removed at bedside & discussed aftercare   Review of systems complete and found to be negative unless listed above   Past Medical History:  Diagnosis Date   Diastolic heart failure (HCC)    Emphysema (subcutaneous) (surgical) resulting from a procedure    Not correct   Emphysema lung ([place])    Hypertension     Past Surgical History:  Procedure Laterality Date   LEFT HEART CATH AND CORONARY ANGIOGRAPHY N/A 03/22/2020   Procedure: LEFT HEART CATH AND CORONARY ANGIOGRAPHY;  Surgeon: Corey Skains, MD;  Location: [place] [facility];  Service: Cardiovascular;  Laterality: N/A;   PACEMAKER LEADLESS INSERTION N/A 11/05/2022   Procedure: PACEMAKER LEADLESS INSERTION;  Surgeon: Isaias Cowman, MD;  Location: [place] [facility];  Service:  Cardiovascular;  Laterality: N/A;    Medications Prior to Admission  Medication Sig Dispense Refill Last Dose   albuterol (VENTOLIN HFA) 108 (90 Base) MCG/ACT inhaler Inhale 2 puffs into the lungs every 6 (six) hours as needed for wheezing or shortness of breath. 8 g 2 prn at prn   atorvastatin (LIPITOR) 80 MG tablet Take 1 tablet (80 mg total) by mouth daily. 30 tablet 0 10/30/2022 at 1000   Calcium Carb-Cholecalciferol (CALCIUM 500 + D PO) Take 1 tablet by mouth as directed. Take 1 tablet three times a week on Sunday, Wednesday and Friday mornings.   10/30/2022 at 1000   Cholecalciferol 50 MCG (2000 UT) CAPS Take 2,000 Units by mouth daily.   10/30/2022 at 1000   diphenhydrAMINE (BENADRYL) 25 mg capsule Take 25 mg by mouth daily.      ELIQUIS 2.5 MG TABS tablet Take 2.5 mg by mouth 2 (two) times daily.   10/30/2022 at 2200   furosemide (LASIX) 40 MG tablet Take 40 mg by mouth daily.   10/30/2022 at 1000   isosorbide mononitrate (IMDUR) 30 MG 24 hr tablet Take 30 mg by mouth daily.   10/30/2022 at 1000   lisinopril (ZESTRIL) 40 MG tablet Take 40 mg by mouth daily.   10/30/2022 at 1000   Multiple Vitamins-Minerals (VITRUM SENIOR) TABS Take by mouth.   10/30/2022 at 1000   sertraline (ZOLOFT) 50 MG tablet Take 50 mg by mouth daily.   10/30/2022 at 1000   spironolactone (ALDACTONE) 25 MG tablet Take 1 tablet (25 mg total) by mouth daily.  30 tablet 0 10/30/2022 at 1000   aspirin EC 81 MG EC tablet Take 1 tablet (81 mg total) by mouth daily. Swallow whole. (Patient not taking: Reported on 10/31/2022) 30 tablet 0 Not Taking   metoprolol tartrate (LOPRESSOR) 25 MG tablet Take 0.5 tablets (12.5 mg total) by mouth 2 (two) times daily. (Patient not taking: Reported on 10/31/2022) 30 tablet 0 Not Taking   Social History   Socioeconomic History   Marital status: Unknown    Spouse name: Not on file   Number of children: Not on file   Years of education: Not on file   Highest education level: Not on file   Occupational History   Not on file  Tobacco Use   Smoking status: Never   Smokeless tobacco: Never  Substance and Sexual Activity   Alcohol use: Never   Drug use: Never   Sexual activity: Not on file  Other Topics Concern   Not on file  Social History Narrative   Not on file   Social Determinants of Health   Financial Resource Strain: Not on file  Food Insecurity: No Food Insecurity (10/31/2022)   Hunger Vital Sign    Worried About Running Out of Food in the Last Year: Never true    Ran Out of Food in the Last Year: Never true  Transportation Needs: No Transportation Needs (10/31/2022)   PRAPARE - Hydrologist (Medical): No    Lack of Transportation (Non-Medical): No  Physical Activity: Not on file  Stress: Not on file  Social Connections: Not on file  Intimate Partner Violence: Not At Risk (10/31/2022)   Humiliation, Afraid, Rape, and Kick questionnaire    Fear of Current or Ex-Partner: No    Emotionally Abused: No    Physically Abused: No    Sexually Abused: No    Family History  Problem Relation Age of Onset   Colon cancer Mother    Hypertension Mother       Intake/Output Summary (Last 24 hours) at 11/06/2022 0838 Last data filed at 11/06/2022 0800 Gross per 24 hour  Intake 135.34 ml  Output 700 ml  Net -564.66 ml     Vitals:   11/06/22 0340 11/06/22 0720 11/06/22 0731 11/06/22 0816  BP: (!) 124/48 129/66 (!) 110/52 (!) 107/51  Pulse: 66   60  Resp: 20 (!) 22 18   Temp: 98.5 F (36.9 C)     TempSrc: Oral     SpO2: 92% 93% 93% 93%  Weight:      Height:        PHYSICAL EXAM General: Pleasant elderly Caucasian female, well nourished, in no acute distress. Laying nearly flat in PCU bed HEENT:  Normocephalic and atraumatic.  Somewhat hard of hearing. Neck:  No JVD.  Lungs: Normal respiratory effort on oxygen by nasal cannula.  Decreased breath sounds bilaterally  heart: paced rhythm. Normal S1 and S2 without gallops or  murmurs.  Abdomen: Non-distended appearing.  Msk: Normal strength and tone for age. Extremities: Warm and well perfused. No clubbing, cyanosis.  No peripheral edema. Figure of 8 suture removed at the bedside by me, incision edges well approximated without active bleeding, ecchymosis, hematoma, or apparent aneurysm. Covered with dry gauze and tegaderm Neuro: Alert and oriented X 3. Psych:  Answers questions appropriately.   Labs: Basic Metabolic Panel: Recent Labs    11/04/22 2306 11/06/22 0622  NA 134* 133*  K 4.3 4.1  CL 99 98  CO2 27  24  GLUCOSE 135* 96  BUN 18 16  CREATININE 1.10* 0.87  CALCIUM 8.8* 8.8*  MG 2.0 1.7    Liver Function Tests: No results for input(s): "AST", "ALT", "ALKPHOS", "BILITOT", "PROT", "ALBUMIN" in the last 72 hours. No results for input(s): "LIPASE", "AMYLASE" in the last 72 hours. CBC: Recent Labs    11/04/22 2306 11/06/22 0622  WBC 7.1 8.8  HGB 10.8* 10.5*  HCT 33.0* 31.8*  MCV 88.9 88.1  PLT 235 234    Cardiac Enzymes: No results for input(s): "CKTOTAL", "CKMB", "CKMBINDEX", "TROPONINIHS" in the last 72 hours.  BNP: No results for input(s): "BNP" in the last 72 hours.  D-Dimer: No results for input(s): "DDIMER" in the last 72 hours.  Hemoglobin A1C: No results for input(s): "HGBA1C" in the last 72 hours. Fasting Lipid Panel: No results for input(s): "CHOL", "HDL", "LDLCALC", "TRIG", "CHOLHDL", "LDLDIRECT" in the last 72 hours. Thyroid Function Tests: No results for input(s): "TSH", "T4TOTAL", "T3FREE", "THYROIDAB" in the last 72 hours.  Invalid input(s): "FREET3" Anemia Panel: No results for input(s): "VITAMINB12", "FOLATE", "FERRITIN", "TIBC", "IRON", "RETICCTPCT" in the last 72 hours.   Radiology: EP PPM/ICD IMPLANT  Result Date: 11/05/2022 Successful Micra AV 2 leadless pacemaker implantation   DG Chest Port 1 View  Result Date: 11/04/2022 CLINICAL DATA:  89 year old female with tachypnea. Planned pacemaker placement.  EXAM: PORTABLE CHEST 1 VIEW COMPARISON:  CT Chest, Abdomen, and Pelvis 11/01/2022, and earlier. FINDINGS: Portable AP upright view at 0329 hours. Pacer or resuscitation pads project over the chest and upper abdomen. Stable lung volumes and mediastinal contours. Coarse bilateral pulmonary interstitial opacity corresponding to recent CT lung findings. No pneumothorax, pleural effusion or consolidation. But pulmonary vascularity remains increased compared to portable chest 10/31/2022. Stable visualized osseous structures.  Negative visible bowel gas. IMPRESSION: 1. Possible pulmonary interstitial edema superimposed on chronic lung disease. 2. No pleural effusion or other acute cardiopulmonary abnormality. Electronically Signed   By: Genevie Ann M.D.   On: 11/04/2022 04:04   CT CHEST ABDOMEN PELVIS WO CONTRAST  Result Date: 11/01/2022 CLINICAL DATA:  Hypotension.  Evaluate for possible aneurysm EXAM: CT CHEST, ABDOMEN AND PELVIS WITHOUT CONTRAST TECHNIQUE: Multidetector CT imaging of the chest, abdomen and pelvis was performed following the standard protocol without IV contrast. RADIATION DOSE REDUCTION: This exam was performed according to the departmental dose-optimization program which includes automated exposure control, adjustment of the mA and/or kV according to patient size and/or use of iterative reconstruction technique. COMPARISON:  Chest radiograph dated 11/01/2022. FINDINGS: Evaluation of this exam is limited in the absence of intravenous contrast. CT CHEST FINDINGS Cardiovascular: There is no cardiomegaly or pericardial effusion. There is advanced 3 vessel coronary vascular calcification. Moderate atherosclerotic calcification of the thoracic aorta. No aneurysmal dilatation. The central pulmonary arteries are grossly unremarkable on this noncontrast CT. Mediastinum/Nodes: No hilar or mediastinal adenopathy. The esophagus is grossly unremarkable. No mediastinal fluid collection. Lungs/Pleura: Scattered  pulmonary nodules measure up to 5 mm in the left upper lobe. Findings may be related to underlying granulomatous disease such as sarcoid or other inflammatory processes. Clinical correlation is recommended. There is a focal area of pleural thickening with associated reticulation in the anterior right middle lobe, likely scarring. There is trace bilateral pleural effusions. No focal consolidation or pneumothorax. The central airways are patent. Musculoskeletal: Osteopenia with degenerative changes of the spine. No acute osseous pathology. CT ABDOMEN PELVIS FINDINGS No intra-abdominal free air or free fluid. Hepatobiliary: The liver is unremarkable. No biliary dilatation. The gallbladder  is unremarkable. Pancreas: Mild haziness of the fat adjacent to the head and uncinate process of the pancreas. Correlation with pancreatic enzymes recommended to exclude pancreatitis. No fluid collection or abscess. Spleen: Normal in size without focal abnormality. Adrenals/Urinary Tract: The adrenal glands are unremarkable. Mild bilateral renal parenchyma atrophy. There is no hydronephrosis or nephrolithiasis on either side. There is a 5 cm right renal inferior pole cyst. The visualized ureters and urinary bladder appear unremarkable. Stomach/Bowel: There is severe sigmoid diverticulosis without active inflammatory changes. There is no bowel obstruction or active inflammation. The appendix is not visualized with certainty. No inflammatory changes identified in the right lower quadrant. Vascular/Lymphatic: Advanced aortoiliac atherosclerotic disease. No aneurysmal dilatation. The IVC is unremarkable. No portal venous gas. There is no adenopathy. Reproductive: Hysterectomy. The right ovary is not identified with certainty. Left ovarian cyst measure up to 2.5 cm. No follow-up imaging recommended. Note: This recommendation does not apply to premenarchal patients and to those with increased risk (genetic, family history, elevated tumor  markers or other high-risk factors) of ovarian cancer. Reference: JACR 2020 Feb; 17(2):248-254 Other: None Musculoskeletal: Osteopenia with multilevel degenerative changes of the spine. No acute osseous pathology. IMPRESSION: 1. No aortic aneurysm. 2. Trace bilateral pleural effusions. 3. Scattered pulmonary nodules measure up to 5 mm in the left upper lobe. Findings may be related to underlying granulomatous disease such as sarcoid or other inflammatory processes. Follow-up with chest CT in 6 months recommended. 4. Mild haziness of the fat adjacent to the head and uncinate process of the pancreas. Correlation with pancreatic enzymes recommended to exclude pancreatitis. No fluid collection or abscess. 5. Severe sigmoid diverticulosis without active inflammatory changes. No bowel obstruction. 6.  Aortic Atherosclerosis (ICD10-I70.0). Electronically Signed   By: Anner Crete M.D.   On: 11/01/2022 22:22   DG Chest Port 1 View  Result Date: 11/01/2022 CLINICAL DATA:  Dyspnea EXAM: PORTABLE CHEST 1 VIEW COMPARISON:  10/31/2022 FINDINGS: Cardiac shadow is prominent but stable from the prior exam. Diffuse interstitial changes are again identified although increased central vascular congestion is now noted. No edema is seen. No bony abnormality is noted. IMPRESSION: New superimposed vascular congestion is now seen overlying the previously seen chronic interstitial changes. Electronically Signed   By: Inez Catalina M.D.   On: 11/01/2022 18:46   [place] EKG SITE RITE  Result Date: 11/01/2022 If Site Rite image not attached, placement could not be confirmed due to current cardiac rhythm.  ECHOCARDIOGRAM COMPLETE  Result Date: 11/01/2022    ECHOCARDIOGRAM REPORT   Patient Name:   LORENA BROSKEY Date of Exam: 11/01/2022 Medical Rec #:  [id number]      Height:       61.0 in Accession #:    [id number]     Weight:       114.2 lb Date of Birth:  Nov 16, 1931      BSA:          1.488 m Patient Age:    57 years       BP:            99/45 mmHg Patient Gender: F              HR:           139 bpm. Exam Location:  [facility] Procedure: 2D Echo, Color Doppler, Cardiac Doppler and Intracardiac            Opacification Agent Indications:     Chest Pain R07.9  History:  Patient has prior history of Echocardiogram examinations. DHF,                  Emphysema; Risk Factors:Hypertension.  Sonographer:     L. Thornton-Maynard Referring Phys:  [id number] TINA LAI Diagnosing Phys: Yolonda Kida MD IMPRESSIONS  1. AFIB during study.  2. Left ventricular ejection fraction, by estimation, is 55 to 60%. The left ventricle has normal function. The left ventricle has no regional wall motion abnormalities. Left ventricular diastolic parameters are consistent with Grade I diastolic dysfunction (impaired relaxation).  3. Right ventricular systolic function is normal. The right ventricular size is normal. There is normal pulmonary artery systolic pressure.  4. The mitral valve is normal in structure. Trivial mitral valve regurgitation.  5. The aortic valve is normal in structure. Aortic valve regurgitation is not visualized. FINDINGS  Left Ventricle: Left ventricular ejection fraction, by estimation, is 55 to 60%. The left ventricle has normal function. The left ventricle has no regional wall motion abnormalities. Definity contrast agent was given IV to delineate the left ventricular  endocardial borders. The left ventricular internal cavity size was normal in size. There is borderline concentric left ventricular hypertrophy. Left ventricular diastolic parameters are consistent with Grade I diastolic dysfunction (impaired relaxation). Right Ventricle: The right ventricular size is normal. No increase in right ventricular wall thickness. Right ventricular systolic function is normal. There is normal pulmonary artery systolic pressure. The tricuspid regurgitant velocity is 2.44 m/s, and  with an assumed right atrial pressure of 3 mmHg, the estimated right  ventricular systolic pressure is 0000000 mmHg. Left Atrium: Left atrial size was normal in size. Right Atrium: Right atrial size was normal in size. Pericardium: There is no evidence of pericardial effusion. Mitral Valve: The mitral valve is normal in structure. Trivial mitral valve regurgitation. Tricuspid Valve: The tricuspid valve is normal in structure. Tricuspid valve regurgitation is trivial. Aortic Valve: The aortic valve is normal in structure. Aortic valve regurgitation is not visualized. Aortic valve mean gradient measures 4.0 mmHg. Aortic valve peak gradient measures 6.2 mmHg. Aortic valve area, by VTI measures 1.77 cm. Pulmonic Valve: The pulmonic valve was normal in structure. Pulmonic valve regurgitation is not visualized. Aorta: The ascending aorta was not well visualized. IAS/Shunts: No atrial level shunt detected by color flow Doppler. Additional Comments: AFIB during study.  LEFT VENTRICLE PLAX 2D LVIDd:         3.20 cm     Diastology LVIDs:         2.30 cm     LV e' medial:    4.68 cm/s LV PW:         1.00 cm     LV E/e' medial:  14.4 LV IVS:        1.60 cm     LV e' lateral:   5.22 cm/s LVOT diam:     1.70 cm     LV E/e' lateral: 13.0 LV SV:         28 LV SV Index:   19 LVOT Area:     2.27 cm  LV Volumes (MOD) LV vol d, MOD A2C: 21.2 ml LV vol d, MOD A4C: 32.3 ml LV vol s, MOD A2C: 9.6 ml LV vol s, MOD A4C: 9.6 ml LV SV MOD A2C:     11.6 ml LV SV MOD A4C:     32.3 ml LV SV MOD BP:      16.2 ml RIGHT VENTRICLE RV S prime:  9.03 cm/s LEFT ATRIUM             Index        RIGHT ATRIUM          Index LA diam:        3.70 cm 2.49 cm/m   RA Area:     8.51 cm LA Vol (A2C):   32.0 ml 21.50 ml/m  RA Volume:   13.30 ml 8.94 ml/m LA Vol (A4C):   36.6 ml 24.59 ml/m LA Biplane Vol: 34.5 ml 23.18 ml/m  AORTIC VALVE                    PULMONIC VALVE AV Area (Vmax):    1.87 cm     PV Vmax:       1.05 m/s AV Area (Vmean):   1.75 cm     PV Peak grad:  4.4 mmHg AV Area (VTI):     1.77 cm AV Vmax:            124.00 cm/s AV Vmean:          97.050 cm/s AV VTI:            0.160 m AV Peak Grad:      6.2 mmHg AV Mean Grad:      4.0 mmHg LVOT Vmax:         102.20 cm/s LVOT Vmean:        74.750 cm/s LVOT VTI:          0.125 m LVOT/AV VTI ratio: 0.78  AORTA Ao Root diam: 2.90 cm Ao Asc diam:  3.50 cm MITRAL VALVE               TRICUSPID VALVE MV Area (PHT): 11.49 cm   TR Peak grad:   23.8 mmHg MV Decel Time: 66 msec     TR Vmax:        244.00 cm/s MV E velocity: 67.60 cm/s                            SHUNTS                            Systemic VTI:  0.12 m                            Systemic Diam: 1.70 cm Yolonda Kida MD Electronically signed by Yolonda Kida MD Signature Date/Time: 11/01/2022/2:30:23 PM    Final    DG Shoulder Right  Result Date: 10/31/2022 CLINICAL DATA:  Right shoulder pain. EXAM: RIGHT SHOULDER - 2+ VIEW COMPARISON:  Chest radiograph dated 10/31/2022. FINDINGS: No acute fracture or dislocation the bones are osteopenic. Mild degenerative changes of the right shoulder. Rounded osseous density over the humeral neck may represent loose bodies. The soft tissues are unremarkable. IMPRESSION: 1. No acute fracture or dislocation. 2. Mild degenerative changes of the right shoulder. Electronically Signed   By: Anner Crete M.D.   On: 10/31/2022 02:22   DG Chest 2 View  Result Date: 10/31/2022 CLINICAL DATA:  Chest pain. EXAM: CHEST - 2 VIEW COMPARISON:  Chest radiograph dated 09/16/2022. FINDINGS: There is diffuse chronic interstitial coarsening. Mild elevation of the right hemidiaphragm. No focal consolidation, pleural effusion or pneumothorax. The cardiac silhouette is within limits. Osteopenia with degenerative changes of the spine. No acute osseous  pathology. IMPRESSION: 1. No acute cardiopulmonary process. 2. Chronic interstitial coarsening. Electronically Signed   By: Anner Crete M.D.   On: 10/31/2022 02:21    [place] 2021 Prox RCA lesion is 60% stenosed. Mid RCA lesion is 30%  stenosed. Ost Cx to Prox Cx lesion is 75% stenosed. Prox Cx lesion is 65% stenosed. Prox LAD lesion is 30% stenosed. Mid LAD lesion is 20% stenosed.   89 year old female with known coronary atherosclerosis hypertension hyperlipidemia paroxysmal nonvalvular atrial fibrillation having acute systolic dysfunction congestive heart failure elevated troponin with apical ballooning by echocardiogram consistent with stress-induced cardiomyopathy rather than acute coronary syndrome   Apical ballooning with ejection fraction of 25 to 30%   75% stenosis of ostial left circumflex artery 60% stenosis of right coronary artery Mild atherosclerosis of left anterior descending artery   Assessment Apical ballooning and/or stress-induced cardiomyopathy with minimal elevation of troponin more consistent with heart failure rather than acute coronary syndrome   Plan Medical management including Lasix beta-blocker spironolactone and ACE inhibitor for cardiomyopathy and congestive heart failure No further cardiac intervention at this time Risk factor modification with high intensity cholesterol therapy Antiplatelet therapy Rehabilitation  ECHO EF (55-60%, g1DD 10/2022, prev 25-30%, multiple rWMAs 2021)  TELEMETRY reviewed by me (LT) 11/06/2022 : initially AF RVR 140s-160s, paced rhythm at 50 BPM  EKG reviewed by me: AF RVR rate 160s  Data reviewed by me (LT) 11/06/2022:  hospitalist progress note, nursing notes, last 24h labs, imaging, vitals tele    Principal Problem:   Unstable angina ([place]) Active Problems:   Essential hypertension   Chronic anticoagulation   Rapid atrial fibrillation (HCC)   Chest pain   Coronary artery disease   Bradyarrhythmia   Atrial fibrillation with rapid ventricular response ([place])    ASSESSMENT AND PLAN:  Anne Mitchell is an 58yoF with a PMH of HF w/ recovered EF (55-60%, g1DD 10/2022, prev 25-30%, multiple rWMAs 2021), hx takotsubo CM, paroxysmal AF (eliquis), CKD 3 who  presented to [facility] ED 10/31/22 with right sided chest pain that radiated to her shoulder. She was in AF RVR on presentation, converted to a junctional bradycardia on amiodarone + a 3 second sinus pause with conversion. She had hypotension requiring levophed the PM of 2/18 and intermittent right sided chest discomfort, responsive to NSAIDS. Continued paroxysms of AF RVR + a 9 second & several 2-3 second conversion pauses that she is symptomatic from. Successful Micra PPM implantation Thursday 2/22.   # paroxysmal AF RVR  # symptomatic bradycardia  # s/p Micra leadless PPM implantation  Presented with atrial fibrillation with RVR with rates as high as 160s, initially on diltiazem and amiodarone infusions for rate and rhythm control.  Eventually converted to a junctional bradycardia with a ~9 second conversion pause, s/p PPM 2/22 -start metoprolol tartrate 12.'5mg'$  BID for rate control -Continue p.o. amiodarone 200 mg twice daily to complete 14 days, then '200mg'$  daily therafter. -Monitor and replete electrolytes for a K >4, mag >2 -Resume Eliquis 2.'5mg'$  BID today (wt <60kg & age >73).  -discussed aftercare instructions with the patient, written instructions will be placed in discharge packet  # non-obstructive CAD + chest pain with typical and atypical features # demand ischemia  Reported chest pressure and "pulsations" on the right side of her chest radiating to her neck and down her shoulder that occurred at rest initially prompting her presentation, in AF RVR on admission as above. Some response to NSAIDS. Troponins elevated and trending 31, 39, 127, 103, likely  demand/supply mismatch and not ACS without ischemic EKG changes and echo is without RWMA's and actually shows recovered EF compared to 2021.   -cancelled [place] 2/21 w/ symptomatic bradycardia + hypotension after conversion from AF RVR to junctional brady as above. -- consider further eval outpatient if patient develops recurrent chest  pain   # acute on chronic HF  Recovered EF at 55-60% with g1dd, BNP elevated at 700, suspect decompensation in the setting of AF RVR. Remains on supplemental O2. Repeat CXR 2/21 with vascular congestion - s/p IV lasix '20mg'$  x1, give another '40mg'$  IV around 12p today. - no BB with baseline bradycardia. GDMT reinitiation limited by hypotension. Off levophed entirely since 2/19. S/p 2L IVF for hypotension - hold home lisinopril 20 mg every other day, Imdur 30 mg daily, spironolactone 25 mg once daily, and Lasix 40 mg once daily  # CKD 3  Renal function normalized following IVF.   Roberts for discharge today from a cardiac perspective. Will arrange for follow up in clinic with Dr. Saralyn Pilar in 1-2 weeks.    This patient's plan of care was discussed and created with Dr. Saralyn Pilar and he is in agreement.  Signed: Tristan Schroeder , PA-C 11/06/2022, 8:38 AM [facility] Cardiology

## 2022-11-07 DIAGNOSIS — I200 Unstable angina: Secondary | ICD-10-CM | POA: Diagnosis not present

## 2022-11-07 MED ORDER — AMIODARONE HCL 200 MG PO TABS: 200.0000 mg | ORAL_TABLET | Freq: Two times a day (BID) | ORAL | 0 refills | Status: DC

## 2022-11-07 MED ORDER — AMIODARONE HCL 200 MG PO TABS: 200.0000 mg | ORAL_TABLET | Freq: Every day | ORAL | 2 refills | Status: DC

## 2022-11-07 MED ORDER — METOPROLOL TARTRATE 25 MG PO TABS: 12.5000 mg | ORAL_TABLET | Freq: Two times a day (BID) | ORAL | 2 refills | Status: DC

## 2022-11-07 MED ORDER — ENSURE ENLIVE PO LIQD: 237.0000 mL | Freq: Two times a day (BID) | ORAL | Status: DC

## 2022-11-07 MED ORDER — ELIQUIS 2.5 MG PO TABS: 2.5000 mg | ORAL_TABLET | Freq: Two times a day (BID) | ORAL | 2 refills | Status: DC

## 2022-11-07 NOTE — Progress Notes (Signed)
[facility] Cardiology  SUBJECTIVE: Patient sitting in recliner, eating breakfast, reports doing well, denies chest pain or shortness of breath   Vitals:   11/06/22 2343 11/07/22 0429 11/07/22 0433 11/07/22 0808  BP: (!) 94/51  103/75 (!) 114/56  Pulse: 63  91 (!) 55  Resp: '16  16 20  '$ Temp: 97.7 F (36.5 C)   98.2 F (36.8 C)  TempSrc: Oral     SpO2: 95%  94% 94%  Weight:  54.2 kg    Height:         Intake/Output Summary (Last 24 hours) at 11/07/2022 0951 Last data filed at 11/06/2022 1742 Gross per 24 hour  Intake 240 ml  Output --  Net 240 ml      PHYSICAL EXAM  General: Well developed, well nourished, in no acute distress HEENT:  Normocephalic and atramatic Neck:  No JVD.  Lungs: Clear bilaterally to auscultation and percussion. Heart: HRRR . Normal S1 and S2 without gallops or murmurs.  Abdomen: Bowel sounds are positive, abdomen soft and non-tender  Msk:  Back normal, normal gait. Normal strength and tone for age. Extremities: No clubbing, cyanosis or edema.   Neuro: Alert and oriented X 3. Psych:  Good affect, responds appropriately   LABS: Basic Metabolic Panel: Recent Labs    11/04/22 2306 11/06/22 0622  NA 134* 133*  K 4.3 4.1  CL 99 98  CO2 27 24  GLUCOSE 135* 96  BUN 18 16  CREATININE 1.10* 0.87  CALCIUM 8.8* 8.8*  MG 2.0 1.7   Liver Function Tests: No results for input(s): "AST", "ALT", "ALKPHOS", "BILITOT", "PROT", "ALBUMIN" in the last 72 hours. No results for input(s): "LIPASE", "AMYLASE" in the last 72 hours. CBC: Recent Labs    11/04/22 2306 11/06/22 0622  WBC 7.1 8.8  HGB 10.8* 10.5*  HCT 33.0* 31.8*  MCV 88.9 88.1  PLT 235 234   Cardiac Enzymes: No results for input(s): "CKTOTAL", "CKMB", "CKMBINDEX", "TROPONINI" in the last 72 hours. BNP: Invalid input(s): "POCBNP" D-Dimer: No results for input(s): "DDIMER" in the last 72 hours. Hemoglobin A1C: No results for input(s): "HGBA1C" in the last 72 hours. Fasting Lipid Panel: No  results for input(s): "CHOL", "HDL", "LDLCALC", "TRIG", "CHOLHDL", "LDLDIRECT" in the last 72 hours. Thyroid Function Tests: No results for input(s): "TSH", "T4TOTAL", "T3FREE", "THYROIDAB" in the last 72 hours.  Invalid input(s): "FREET3" Anemia Panel: No results for input(s): "VITAMINB12", "FOLATE", "FERRITIN", "TIBC", "IRON", "RETICCTPCT" in the last 72 hours.  No results found.   Echo EF 55-60% 11/01/2022  TELEMETRY: Ventricular pacing 61 bpm:  ASSESSMENT AND PLAN:  Principal Problem:   Unstable angina (HCC) Active Problems:   Essential hypertension   Chronic anticoagulation   Rapid atrial fibrillation (HCC)   Chest pain   Coronary artery disease   Bradyarrhythmia   Atrial fibrillation with rapid ventricular response ([place])    1.  Paroxysmal atrial fibrillation, Eliquis for stroke prevention, and metoprolol tartrate and amiodarone for rate and rhythm control 2.  Bradycardia, status post Micra AV 2 leadless pacemaker, with appropriate sensing and pacing 3.  Mildly elevated high-sensitivity troponin (31, 39, 127, 103), the absence of chest pain, likely demand supply ischemia secondary to atrial fibrillation with rapid ventricular rate and CHF 4.  Acute on chronic HFpEF, 55-60% 11/01/2022, improved after diuresis 5.  CKD 3, stable, BUN/creatinine 16 and 0.87 11/06/2022  Recommendations  1.  Agree with current therapy 2.  Continue Eliquis for stroke prevention 3.  Continue amiodarone for rhythm control, continue  200 mg twice daily for 14 days, then reduce to 200 mg daily 4.  May discharge home 5.  Follow-up with me in 1 to 2 weeks    Isaias Cowman, MD, PhD, [facility] 11/07/2022 9:51 AM

## 2022-11-07 NOTE — Discharge Summary (Signed)
Physician Discharge Summary   Anne Mitchell  female DOB: 1932-02-19  [id number]  PCP: Valera Castle, MD  Admit date: 10/31/2022 Discharge date: 11/07/2022  Admitted From: home Disposition:  home Daughter updated on the phone prior to discharge. Home Health: Yes CODE STATUS: Full code  Discharge Instructions     Amb referral to AFIB Clinic   Complete by: As directed    Discharge instructions   Complete by: As directed    Please take amiodarone 200 mg twice a day until 11/16/22, and then starting 11/16/22, take 200 mg just once a day.  This is to control your heart rate.  Your blood pressure has been low.  I have stopped all your home blood pressure medications, except for Lopressor, which you need to control your heart rate.  Please follow up with cardiology as outpatient.   Dr. Enzo Bi [facility] Course:  For full details, please see H&P, progress notes, consult notes and ancillary notes.  Briefly,  Anne Mitchell is a 89 y.o. 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     On arrival of EMS heart rate was in the 140s and she was in A-fib.    Afib w RVR Junctional bradycardia  S/p pacemaker placement on 2/22 Patient converted to junctional bradycardia in the 50s after initial diltiazem bolus and infusion.  Rate control agents held.  Pt went into Afib RVR again morning of 2/18, with BP dropping.  Pt then was started amiodarone bolus+gtt. --cardiology consulted --pt discharged on p.o. amiodarone 200 mg twice daily to complete 14 days, then 200 mg daily therafter.  --started on Lopressor 12.5 mg BID for rate control --cont Eliquis --follow up in clinic with  Dr. Saralyn Pilar in 1-2 weeks.      Hypotension --developed hypotension morning of 2/18 with BP down in 70's, due to Afib RVR.  Received 2L IVF and sent to ICU on pressor briefly. --BP remained soft during hospitalization without home BP medication. --d/c'ed home lasix, aldactone, Imdur and Lisinopril --started on Lopressor 12.5 mg BID for rate control  Chest pain --seems atypical, right-sided, seemed to improve with IV toradol --completed 72 hours of heparin gtt. --stress test was not completed due to symptomatic bradycardia   Trop elevation 2/2 Type 2 MI --Likely demand from RVR.   CAD, history of NSTEMI 2021 History of stress cardiomyopathy 2021 Patient with history of multiple vessel but nonobstructive CAD on cath 2021 --cont statin -- consider further eval outpatient if patient develops recurrent chest pain    AKI CKD 3 ruled out --Cr 1.04 on presentation, up to 1.7  --AKI resolved with IVF   Acute on chronic diastolic CHF --Recovered EF at 55-60% with g1dd, BNP elevated at 700, suspect decompensation in the setting of AF RVR and also having received 2L IVF while hypotensive.  CXR showed New superimposed vascular congestion. --s/p IV lasix 20 and 40. --home lasix and aldactone d/c'ed at discharge due to hypotension.   Unless noted above, medications under "STOP" list are ones pt was not taking PTA.  Discharge Diagnoses:  Principal Problem:   Unstable angina [facility]) Active Problems:   Chest pain   Coronary artery disease  Rapid atrial fibrillation (HCC)   Bradyarrhythmia   Essential hypertension   Chronic anticoagulation   Atrial fibrillation with rapid ventricular response (HCC)   30 Day Unplanned Readmission Risk Score    Flowsheet Row ED to Hosp-Admission (Current) from 10/31/2022 in [place] [facility]  30 Day Unplanned Readmission Risk Score (%) 19.38 Filed at 11/07/2022 0400       This score is the patient's risk of an unplanned readmission  within 30 days of being discharged (0 -100%). The score is based on dignosis, age, lab data, medications, orders, and past utilization.   Low:  0-14.9   Medium: 15-21.9   High: 22-29.9   Extreme: 30 and above         Discharge Instructions:  Allergies as of 11/07/2022       Reactions   Other Rash, Swelling   "mycin" meds Gin Gin alcohol. Swelling of the genital area   Penicillin G Itching   Banana Swelling   Penicillins Swelling   Sulfa Antibiotics         Medication List     STOP taking these medications    aspirin EC 81 MG tablet   diphenhydrAMINE 25 mg capsule Commonly known as: BENADRYL   furosemide 40 MG tablet Commonly known as: LASIX   isosorbide mononitrate 30 MG 24 hr tablet Commonly known as: IMDUR   lisinopril 40 MG tablet Commonly known as: ZESTRIL   spironolactone 25 MG tablet Commonly known as: ALDACTONE       TAKE these medications    albuterol 108 (90 Base) MCG/ACT inhaler Commonly known as: VENTOLIN HFA Inhale 2 puffs into the lungs every 6 (six) hours as needed for wheezing or shortness of breath.   amiodarone 200 MG tablet Commonly known as: PACERONE Take 1 tablet (200 mg total) by mouth 2 (two) times daily for 9 days.   amiodarone 200 MG tablet Commonly known as: PACERONE Take 1 tablet (200 mg total) by mouth daily. Start taking on: November 16, 2022   atorvastatin 80 MG tablet Commonly known as: LIPITOR Take 1 tablet (80 mg total) by mouth daily.   CALCIUM 500 + D PO Take 1 tablet by mouth as directed. Take 1 tablet three times a week on Sunday, Wednesday and Friday mornings.   Cholecalciferol 50 MCG (2000 UT) Caps Take 2,000 Units by mouth daily.   Eliquis 2.5 MG Tabs tablet Generic drug: apixaban Take 1 tablet (2.5 mg total) by mouth 2 (two) times daily.   feeding supplement Liqd Take 237 mLs by mouth 2 (two) times daily between meals.   metoprolol tartrate 25 MG tablet Commonly known as: LOPRESSOR Take 0.5 tablets  (12.5 mg total) by mouth 2 (two) times daily.   sertraline 50 MG tablet Commonly known as: ZOLOFT Take 50 mg by mouth daily.   Vitrum Senior Tabs Take by mouth.         Follow-up Information     Paraschos, Alexander, MD. Go in 1 week(s).   Specialty: Cardiology Contact information: [facility] [place] [address] [phone]                 Allergies  Allergen Reactions   Other Rash and Swelling    "mycin" meds  Gin  Gin alcohol. Swelling of the genital area   Penicillin G Itching   Banana Swelling   Penicillins Swelling   Sulfa Antibiotics      The results of significant diagnostics from this  hospitalization (including imaging, microbiology, ancillary and laboratory) are listed below for reference.   Consultations:   Procedures/Studies: EP PPM/ICD IMPLANT  Result Date: 11/05/2022 Successful Micra AV 2 leadless pacemaker implantation   DG Chest Port 1 View  Result Date: 11/04/2022 CLINICAL DATA:  89 year old female with tachypnea. Planned pacemaker placement. EXAM: PORTABLE CHEST 1 VIEW COMPARISON:  CT Chest, Abdomen, and Pelvis 11/01/2022, and earlier. FINDINGS: Portable AP upright view at 0329 hours. Pacer or resuscitation pads project over the chest and upper abdomen. Stable lung volumes and mediastinal contours. Coarse bilateral pulmonary interstitial opacity corresponding to recent CT lung findings. No pneumothorax, pleural effusion or consolidation. But pulmonary vascularity remains increased compared to portable chest 10/31/2022. Stable visualized osseous structures.  Negative visible bowel gas. IMPRESSION: 1. Possible pulmonary interstitial edema superimposed on chronic lung disease. 2. No pleural effusion or other acute cardiopulmonary abnormality. Electronically Signed   By: Genevie Ann M.D.   On: 11/04/2022 04:04   CT CHEST ABDOMEN PELVIS WO CONTRAST  Result Date: 11/01/2022 CLINICAL DATA:  Hypotension.   Evaluate for possible aneurysm EXAM: CT CHEST, ABDOMEN AND PELVIS WITHOUT CONTRAST TECHNIQUE: Multidetector CT imaging of the chest, abdomen and pelvis was performed following the standard protocol without IV contrast. RADIATION DOSE REDUCTION: This exam was performed according to the departmental dose-optimization program which includes automated exposure control, adjustment of the mA and/or kV according to patient size and/or use of iterative reconstruction technique. COMPARISON:  Chest radiograph dated 11/01/2022. FINDINGS: Evaluation of this exam is limited in the absence of intravenous contrast. CT CHEST FINDINGS Cardiovascular: There is no cardiomegaly or pericardial effusion. There is advanced 3 vessel coronary vascular calcification. Moderate atherosclerotic calcification of the thoracic aorta. No aneurysmal dilatation. The central pulmonary arteries are grossly unremarkable on this noncontrast CT. Mediastinum/Nodes: No hilar or mediastinal adenopathy. The esophagus is grossly unremarkable. No mediastinal fluid collection. Lungs/Pleura: Scattered pulmonary nodules measure up to 5 mm in the left upper lobe. Findings may be related to underlying granulomatous disease such as sarcoid or other inflammatory processes. Clinical correlation is recommended. There is a focal area of pleural thickening with associated reticulation in the anterior right middle lobe, likely scarring. There is trace bilateral pleural effusions. No focal consolidation or pneumothorax. The central airways are patent. Musculoskeletal: Osteopenia with degenerative changes of the spine. No acute osseous pathology. CT ABDOMEN PELVIS FINDINGS No intra-abdominal free air or free fluid. Hepatobiliary: The liver is unremarkable. No biliary dilatation. The gallbladder is unremarkable. Pancreas: Mild haziness of the fat adjacent to the head and uncinate process of the pancreas. Correlation with pancreatic enzymes recommended to exclude pancreatitis.  No fluid collection or abscess. Spleen: Normal in size without focal abnormality. Adrenals/Urinary Tract: The adrenal glands are unremarkable. Mild bilateral renal parenchyma atrophy. There is no hydronephrosis or nephrolithiasis on either side. There is a 5 cm right renal inferior pole cyst. The visualized ureters and urinary bladder appear unremarkable. Stomach/Bowel: There is severe sigmoid diverticulosis without active inflammatory changes. There is no bowel obstruction or active inflammation. The appendix is not visualized with certainty. No inflammatory changes identified in the right lower quadrant. Vascular/Lymphatic: Advanced aortoiliac atherosclerotic disease. No aneurysmal dilatation. The IVC is unremarkable. No portal venous gas. There is no adenopathy. Reproductive: Hysterectomy. The right ovary is not identified with certainty. Left ovarian cyst measure up to 2.5 cm. No follow-up imaging recommended. Note: This recommendation does not apply to premenarchal patients and to those with increased risk (genetic, family history, elevated tumor markers or other high-risk factors)  of ovarian cancer. Reference: JACR 2020 Feb; 17(2):248-254 Other: None Musculoskeletal: Osteopenia with multilevel degenerative changes of the spine. No acute osseous pathology. IMPRESSION: 1. No aortic aneurysm. 2. Trace bilateral pleural effusions. 3. Scattered pulmonary nodules measure up to 5 mm in the left upper lobe. Findings may be related to underlying granulomatous disease such as sarcoid or other inflammatory processes. Follow-up with chest CT in 6 months recommended. 4. Mild haziness of the fat adjacent to the head and uncinate process of the pancreas. Correlation with pancreatic enzymes recommended to exclude pancreatitis. No fluid collection or abscess. 5. Severe sigmoid diverticulosis without active inflammatory changes. No bowel obstruction. 6.  Aortic Atherosclerosis (ICD10-I70.0). Electronically Signed   By: Anner Crete M.D.   On: 11/01/2022 22:22   DG Chest Port 1 View  Result Date: 11/01/2022 CLINICAL DATA:  Dyspnea EXAM: PORTABLE CHEST 1 VIEW COMPARISON:  10/31/2022 FINDINGS: Cardiac shadow is prominent but stable from the prior exam. Diffuse interstitial changes are again identified although increased central vascular congestion is now noted. No edema is seen. No bony abnormality is noted. IMPRESSION: New superimposed vascular congestion is now seen overlying the previously seen chronic interstitial changes. Electronically Signed   By: Inez Catalina M.D.   On: 11/01/2022 18:46   [place] EKG SITE RITE  Result Date: 11/01/2022 If Site Rite image not attached, placement could not be confirmed due to current cardiac rhythm.  ECHOCARDIOGRAM COMPLETE  Result Date: 11/01/2022    ECHOCARDIOGRAM REPORT   Patient Name:   Anne Mitchell Date of Exam: 11/01/2022 Medical Rec #:  [id number]      Height:       61.0 in Accession #:    [id number]     Weight:       114.2 lb Date of Birth:  06-04-1932      BSA:          1.488 m Patient Age:    14 years       BP:           99/45 mmHg Patient Gender: F              HR:           139 bpm. Exam Location:  [facility] Procedure: 2D Echo, Color Doppler, Cardiac Doppler and Intracardiac            Opacification Agent Indications:     Chest Pain R07.9  History:         Patient has prior history of Echocardiogram examinations. DHF,                  Emphysema; Risk Factors:Hypertension.  Sonographer:     L. Thornton-Maynard Referring Phys:  [id number] Lavontay Kirk Diagnosing Phys: Yolonda Kida MD IMPRESSIONS  1. AFIB during study.  2. Left ventricular ejection fraction, by estimation, is 55 to 60%. The left ventricle has normal function. The left ventricle has no regional wall motion abnormalities. Left ventricular diastolic parameters are consistent with Grade I diastolic dysfunction (impaired relaxation).  3. Right ventricular systolic function is normal. The right ventricular size is normal. There  is normal pulmonary artery systolic pressure.  4. The mitral valve is normal in structure. Trivial mitral valve regurgitation.  5. The aortic valve is normal in structure. Aortic valve regurgitation is not visualized. FINDINGS  Left Ventricle: Left ventricular ejection fraction, by estimation, is 55 to 60%. The left ventricle has normal function. The left ventricle has no regional wall motion abnormalities. Definity contrast  agent was given IV to delineate the left ventricular  endocardial borders. The left ventricular internal cavity size was normal in size. There is borderline concentric left ventricular hypertrophy. Left ventricular diastolic parameters are consistent with Grade I diastolic dysfunction (impaired relaxation). Right Ventricle: The right ventricular size is normal. No increase in right ventricular wall thickness. Right ventricular systolic function is normal. There is normal pulmonary artery systolic pressure. The tricuspid regurgitant velocity is 2.44 m/s, and  with an assumed right atrial pressure of 3 mmHg, the estimated right ventricular systolic pressure is 0000000 mmHg. Left Atrium: Left atrial size was normal in size. Right Atrium: Right atrial size was normal in size. Pericardium: There is no evidence of pericardial effusion. Mitral Valve: The mitral valve is normal in structure. Trivial mitral valve regurgitation. Tricuspid Valve: The tricuspid valve is normal in structure. Tricuspid valve regurgitation is trivial. Aortic Valve: The aortic valve is normal in structure. Aortic valve regurgitation is not visualized. Aortic valve mean gradient measures 4.0 mmHg. Aortic valve peak gradient measures 6.2 mmHg. Aortic valve area, by VTI measures 1.77 cm. Pulmonic Valve: The pulmonic valve was normal in structure. Pulmonic valve regurgitation is not visualized. Aorta: The ascending aorta was not well visualized. IAS/Shunts: No atrial level shunt detected by color flow Doppler. Additional Comments:  AFIB during study.  LEFT VENTRICLE PLAX 2D LVIDd:         3.20 cm     Diastology LVIDs:         2.30 cm     LV e' medial:    4.68 cm/s LV PW:         1.00 cm     LV E/e' medial:  14.4 LV IVS:        1.60 cm     LV e' lateral:   5.22 cm/s LVOT diam:     1.70 cm     LV E/e' lateral: 13.0 LV SV:         28 LV SV Index:   19 LVOT Area:     2.27 cm  LV Volumes (MOD) LV vol d, MOD A2C: 21.2 ml LV vol d, MOD A4C: 32.3 ml LV vol s, MOD A2C: 9.6 ml LV vol s, MOD A4C: 9.6 ml LV SV MOD A2C:     11.6 ml LV SV MOD A4C:     32.3 ml LV SV MOD BP:      16.2 ml RIGHT VENTRICLE RV S prime:     9.03 cm/s LEFT ATRIUM             Index        RIGHT ATRIUM          Index LA diam:        3.70 cm 2.49 cm/m   RA Area:     8.51 cm LA Vol (A2C):   32.0 ml 21.50 ml/m  RA Volume:   13.30 ml 8.94 ml/m LA Vol (A4C):   36.6 ml 24.59 ml/m LA Biplane Vol: 34.5 ml 23.18 ml/m  AORTIC VALVE                    PULMONIC VALVE AV Area (Vmax):    1.87 cm     PV Vmax:       1.05 m/s AV Area (Vmean):   1.75 cm     PV Peak grad:  4.4 mmHg AV Area (VTI):     1.77 cm AV Vmax:  124.00 cm/s AV Vmean:          97.050 cm/s AV VTI:            0.160 m AV Peak Grad:      6.2 mmHg AV Mean Grad:      4.0 mmHg LVOT Vmax:         102.20 cm/s LVOT Vmean:        74.750 cm/s LVOT VTI:          0.125 m LVOT/AV VTI ratio: 0.78  AORTA Ao Root diam: 2.90 cm Ao Asc diam:  3.50 cm MITRAL VALVE               TRICUSPID VALVE MV Area (PHT): 11.49 cm   TR Peak grad:   23.8 mmHg MV Decel Time: 66 msec     TR Vmax:        244.00 cm/s MV E velocity: 67.60 cm/s                            SHUNTS                            Systemic VTI:  0.12 m                            Systemic Diam: 1.70 cm Yolonda Kida MD Electronically signed by Yolonda Kida MD Signature Date/Time: 11/01/2022/2:30:23 PM    Final    DG Shoulder Right  Result Date: 10/31/2022 CLINICAL DATA:  Right shoulder pain. EXAM: RIGHT SHOULDER - 2+ VIEW COMPARISON:  Chest radiograph dated  10/31/2022. FINDINGS: No acute fracture or dislocation the bones are osteopenic. Mild degenerative changes of the right shoulder. Rounded osseous density over the humeral neck may represent loose bodies. The soft tissues are unremarkable. IMPRESSION: 1. No acute fracture or dislocation. 2. Mild degenerative changes of the right shoulder. Electronically Signed   By: Anner Crete M.D.   On: 10/31/2022 02:22   DG Chest 2 View  Result Date: 10/31/2022 CLINICAL DATA:  Chest pain. EXAM: CHEST - 2 VIEW COMPARISON:  Chest radiograph dated 09/16/2022. FINDINGS: There is diffuse chronic interstitial coarsening. Mild elevation of the right hemidiaphragm. No focal consolidation, pleural effusion or pneumothorax. The cardiac silhouette is within limits. Osteopenia with degenerative changes of the spine. No acute osseous pathology. IMPRESSION: 1. No acute cardiopulmonary process. 2. Chronic interstitial coarsening. Electronically Signed   By: Anner Crete M.D.   On: 10/31/2022 02:21      Labs: BNP (last 3 results) Recent Labs    10/31/22 0149 11/02/22 1104  BNP 53.1 [id number]*   Basic Metabolic Panel: Recent Labs  Lab 11/02/22 0302 11/03/22 0407 11/04/22 0518 11/04/22 2306 11/06/22 0622  NA 133* 138 134* 134* 133*  K 4.4 4.3 4.6 4.3 4.1  CL 104 106 105 99 98  CO2 '23 24 22 27 24  '$ GLUCOSE 108* 99 102* 135* 96  BUN 29* '19 17 18 16  '$ CREATININE 1.31* 0.89 0.94 1.10* 0.87  CALCIUM 8.1* 8.6* 8.6* 8.8* 8.8*  MG 1.7 1.8 2.2 2.0 1.7   Liver Function Tests: No results for input(s): "AST", "ALT", "ALKPHOS", "BILITOT", "PROT", "ALBUMIN" in the last 168 hours. No results for input(s): "LIPASE", "AMYLASE" in the last 168 hours. No results for input(s): "AMMONIA" in the last 168 hours. CBC: Recent Labs  Lab 11/02/22  0302 11/03/22 0407 11/04/22 0518 11/04/22 2306 11/06/22 0622  WBC 8.7 7.2 8.0 7.1 8.8  HGB 10.2* 10.3* 9.9* 10.8* 10.5*  HCT 31.5* 32.4* 30.9* 33.0* 31.8*  MCV 90.0 91.0 89.8 88.9  88.1  PLT 212 225 226 235 234   Cardiac Enzymes: No results for input(s): "CKTOTAL", "CKMB", "CKMBINDEX", "TROPONINI" in the last 168 hours. BNP: Invalid input(s): "POCBNP" CBG: Recent Labs  Lab 11/01/22 1228  GLUCAP 96   D-Dimer No results for input(s): "DDIMER" in the last 72 hours. Hgb A1c No results for input(s): "HGBA1C" in the last 72 hours. Lipid Profile No results for input(s): "CHOL", "HDL", "LDLCALC", "TRIG", "CHOLHDL", "LDLDIRECT" in the last 72 hours. Thyroid function studies No results for input(s): "TSH", "T4TOTAL", "T3FREE", "THYROIDAB" in the last 72 hours.  Invalid input(s): "FREET3" Anemia work up No results for input(s): "VITAMINB12", "FOLATE", "FERRITIN", "TIBC", "IRON", "RETICCTPCT" in the last 72 hours. Urinalysis    Component Value Date/Time   COLORURINE STRAW (A) 10/31/2022 0237   APPEARANCEUR CLEAR (A) 10/31/2022 0237   LABSPEC 1.003 (L) 10/31/2022 0237   PHURINE 7.0 10/31/2022 0237   GLUCOSEU NEGATIVE 10/31/2022 0237   HGBUR SMALL (A) 10/31/2022 0237   BILIRUBINUR NEGATIVE 10/31/2022 0237   KETONESUR NEGATIVE 10/31/2022 0237   PROTEINUR NEGATIVE 10/31/2022 0237   NITRITE NEGATIVE 10/31/2022 0237   LEUKOCYTESUR NEGATIVE 10/31/2022 0237   Sepsis Labs Recent Labs  Lab 11/03/22 0407 11/04/22 0518 11/04/22 2306 11/06/22 0622  WBC 7.2 8.0 7.1 8.8   Microbiology Recent Results (from the past 240 hour(s))  MRSA Next Gen by PCR, Nasal     Status: None   Collection Time: 11/03/22  4:10 AM   Specimen: Nasal Mucosa; Nasal Swab  Result Value Ref Range Status   MRSA by PCR Next Gen NOT DETECTED NOT DETECTED Final    Comment: (NOTE) The GeneXpert MRSA Assay (FDA approved for NASAL specimens only), is one component of a comprehensive MRSA colonization surveillance program. It is not intended to diagnose MRSA infection nor to guide or monitor treatment for MRSA infections. Test performance is not FDA approved in patients less than 13  years old. Performed at [facility] [address] [place], [place] [address]      Total time spend on discharging this patient, including the last patient exam, discussing the hospital stay, instructions for ongoing care as it relates to all pertinent caregivers, as well as preparing the medical discharge records, prescriptions, and/or referrals as applicable, is 50 minutes.    Enzo Bi, MD  [facility] 11/07/2022, 8:59 AM

## 2022-11-07 NOTE — TOC Transition Note (Signed)
Transition of Care [facility]) - CM/SW Discharge Note   Patient Details  Name: Anne Mitchell MRN: [id number] Date of Birth: 1932/05/14  [facility]) CM/SW Contact:  Valente David, RN Phone Number: 11/07/2022, 10:58 AM   Clinical Narrative:     Patient discharged home today, daughter providing transportation.  She does not have any questions regarding follow up medications or appointments.  Daughter agrees to recommendations for [facility], will have PT and RN through [employer], accepted by [employer].  HHPT/RN orders requested.  TOC will sign off.   Final next level of care: Home w Home Health Services Barriers to Discharge: Barriers Resolved   Patient Goals and CMS Choice CMS Medicare.gov Compare Post Acute Care list provided to:: Patient Represenative (must comment) Choice offered to / list presented to : Adult Children  Discharge Placement                         Discharge Plan and Services Additional resources added to the After Visit Summary for                            [facility] Arranged: PT, RN [facility] Agency: [place] ([facility]) Date HH Agency Contacted: 11/07/22 Time [place]: 1053 Representative spoke with at [place]: [place] Determinants of Health ([place]) Interventions SDOH Screenings   Food Insecurity: No Food Insecurity (10/31/2022)  Housing: Low Risk  (10/31/2022)  Transportation Needs: No Transportation Needs (10/31/2022)  Utilities: Not At Risk (10/31/2022)  Tobacco Use: Low Risk  (11/05/2022)     Readmission Risk Interventions     No data to display

## 2022-11-12 ENCOUNTER — Other Ambulatory Visit: Payer: Self-pay

## 2022-11-12 ENCOUNTER — Inpatient Hospital Stay
Admission: EM | Admit: 2022-11-12 | Discharge: 2022-11-15 | DRG: 291 | Disposition: A | Discharge status: Home / Self Care | Payer: Medicare Other | Attending: Internal Medicine | Admitting: Internal Medicine

## 2022-11-12 ENCOUNTER — Emergency Department: Payer: Medicare Other

## 2022-11-12 DIAGNOSIS — Z880 Allergy status to penicillin: Secondary | ICD-10-CM

## 2022-11-12 DIAGNOSIS — I498 Other specified cardiac arrhythmias: Secondary | ICD-10-CM | POA: Diagnosis not present

## 2022-11-12 DIAGNOSIS — I110 Hypertensive heart disease with heart failure: Principal | ICD-10-CM | POA: Diagnosis present

## 2022-11-12 DIAGNOSIS — Z8249 Family history of ischemic heart disease and other diseases of the circulatory system: Secondary | ICD-10-CM

## 2022-11-12 DIAGNOSIS — I5033 Acute on chronic diastolic (congestive) heart failure: Secondary | ICD-10-CM | POA: Diagnosis present

## 2022-11-12 DIAGNOSIS — Z950 Presence of cardiac pacemaker: Secondary | ICD-10-CM

## 2022-11-12 DIAGNOSIS — Z1152 Encounter for screening for COVID-19: Secondary | ICD-10-CM

## 2022-11-12 DIAGNOSIS — Z7901 Long term (current) use of anticoagulants: Secondary | ICD-10-CM

## 2022-11-12 DIAGNOSIS — I482 Chronic atrial fibrillation, unspecified: Secondary | ICD-10-CM | POA: Insufficient documentation

## 2022-11-12 DIAGNOSIS — I10 Essential (primary) hypertension: Secondary | ICD-10-CM | POA: Diagnosis present

## 2022-11-12 DIAGNOSIS — Z888 Allergy status to other drugs, medicaments and biological substances status: Secondary | ICD-10-CM

## 2022-11-12 DIAGNOSIS — I480 Paroxysmal atrial fibrillation: Secondary | ICD-10-CM | POA: Diagnosis present

## 2022-11-12 DIAGNOSIS — J9601 Acute respiratory failure with hypoxia: Secondary | ICD-10-CM | POA: Diagnosis not present

## 2022-11-12 DIAGNOSIS — I509 Heart failure, unspecified: Secondary | ICD-10-CM

## 2022-11-12 DIAGNOSIS — Z79899 Other long term (current) drug therapy: Secondary | ICD-10-CM

## 2022-11-12 DIAGNOSIS — Z882 Allergy status to sulfonamides status: Secondary | ICD-10-CM

## 2022-11-12 DIAGNOSIS — Z91018 Allergy to other foods: Secondary | ICD-10-CM

## 2022-11-12 DIAGNOSIS — J439 Emphysema, unspecified: Secondary | ICD-10-CM | POA: Diagnosis present

## 2022-11-12 DIAGNOSIS — I2510 Atherosclerotic heart disease of native coronary artery without angina pectoris: Secondary | ICD-10-CM | POA: Diagnosis present

## 2022-11-12 LAB — COMPREHENSIVE METABOLIC PANEL
ALT: 42 U/L (ref 0–44)
AST: 46 U/L — ABNORMAL HIGH (ref 15–41)
Albumin: 3.5 g/dL (ref 3.5–5.0)
Alkaline Phosphatase: 81 U/L (ref 38–126)
Anion gap: 10 (ref 5–15)
BUN: 22 mg/dL (ref 8–23)
CO2: 25 mmol/L (ref 22–32)
Calcium: 9.5 mg/dL (ref 8.9–10.3)
Chloride: 104 mmol/L (ref 98–111)
Creatinine, Ser: 1.21 mg/dL — ABNORMAL HIGH (ref 0.44–1.00)
GFR, Estimated: 43 mL/min — ABNORMAL LOW (ref >60)
Glucose, Bld: 98 mg/dL (ref 70–99)
Potassium: 4.7 mmol/L (ref 3.5–5.1)
Sodium: 139 mmol/L (ref 135–145)
Total Bilirubin: 0.6 mg/dL (ref 0.3–1.2)
Total Protein: 7.2 g/dL (ref 6.5–8.1)

## 2022-11-12 LAB — TROPONIN I (HIGH SENSITIVITY)
Troponin I (High Sensitivity): 13 ng/L (ref <18)
Troponin I (High Sensitivity): 13 ng/L (ref <18)

## 2022-11-12 LAB — CBC WITH DIFFERENTIAL/PLATELET
Abs Immature Granulocytes: 0.04 10*3/uL (ref 0.00–0.07)
Basophils Absolute: 0 10*3/uL (ref 0.0–0.1)
Basophils Relative: 0 %
Eosinophils Absolute: 0.2 10*3/uL (ref 0.0–0.5)
Eosinophils Relative: 2 %
HCT: 33.7 % — ABNORMAL LOW (ref 36.0–46.0)
Hemoglobin: 10.6 g/dL — ABNORMAL LOW (ref 12.0–15.0)
Immature Granulocytes: 0 %
Lymphocytes Relative: 13 %
Lymphs Abs: 1.5 10*3/uL (ref 0.7–4.0)
MCH: 29.2 pg (ref 26.0–34.0)
MCHC: 31.5 g/dL (ref 30.0–36.0)
MCV: 92.8 fL (ref 80.0–100.0)
Monocytes Absolute: 0.8 10*3/uL (ref 0.1–1.0)
Monocytes Relative: 7 %
Neutro Abs: 9.3 10*3/uL — ABNORMAL HIGH (ref 1.7–7.7)
Neutrophils Relative %: 78 %
Platelets: 377 10*3/uL (ref 150–400)
RBC: 3.63 MIL/uL — ABNORMAL LOW (ref 3.87–5.11)
RDW: 15.6 % — ABNORMAL HIGH (ref 11.5–15.5)
WBC: 11.9 10*3/uL — ABNORMAL HIGH (ref 4.0–10.5)
nRBC: 0 % (ref 0.0–0.2)

## 2022-11-12 LAB — RESP PANEL BY RT-PCR (RSV, FLU A&B, COVID)  RVPGX2
Influenza A by PCR: NEGATIVE
Influenza B by PCR: NEGATIVE
Resp Syncytial Virus by PCR: NEGATIVE
SARS Coronavirus 2 by RT PCR: NEGATIVE

## 2022-11-12 LAB — BRAIN NATRIURETIC PEPTIDE: B Natriuretic Peptide: 1926.4 pg/mL — ABNORMAL HIGH (ref 0.0–100.0)

## 2022-11-12 LAB — MAGNESIUM: Magnesium: 2.5 mg/dL — ABNORMAL HIGH (ref 1.7–2.4)

## 2022-11-12 LAB — LACTIC ACID, PLASMA: Lactic Acid, Venous: 1.6 mmol/L (ref 0.5–1.9)

## 2022-11-12 MED ORDER — ONDANSETRON HCL 4 MG PO TABS: 4.0000 mg | ORAL_TABLET | Freq: Four times a day (QID) | ORAL | Status: DC | PRN

## 2022-11-12 MED ORDER — FUROSEMIDE 10 MG/ML IJ SOLN
60.0000 mg | Freq: Once | INTRAMUSCULAR | Status: AC
  Administered 2022-11-12: 60 mg via INTRAVENOUS
  Filled 2022-11-12: qty 8

## 2022-11-12 MED ORDER — ENOXAPARIN SODIUM 30 MG/0.3ML IJ SOSY: 30.0000 mg | PREFILLED_SYRINGE | INTRAMUSCULAR | Status: DC

## 2022-11-12 MED ORDER — ATORVASTATIN CALCIUM 80 MG PO TABS
80.0000 mg | ORAL_TABLET | Freq: Every day | ORAL | Status: DC
  Administered 2022-11-13 – 2022-11-15 (×3): 80 mg via ORAL
  Filled 2022-11-12 (×3): qty 1

## 2022-11-12 MED ORDER — IPRATROPIUM-ALBUTEROL 0.5-2.5 (3) MG/3ML IN SOLN
3.0000 mL | Freq: Once | RESPIRATORY_TRACT | Status: AC
  Administered 2022-11-12: 3 mL via RESPIRATORY_TRACT
  Filled 2022-11-12: qty 3

## 2022-11-12 MED ORDER — ONDANSETRON HCL 4 MG/2ML IJ SOLN: 4.0000 mg | Freq: Four times a day (QID) | INTRAMUSCULAR | Status: DC | PRN

## 2022-11-12 MED ORDER — METOPROLOL TARTRATE 25 MG PO TABS: 12.5000 mg | ORAL_TABLET | Freq: Two times a day (BID) | ORAL | Status: DC

## 2022-11-12 MED ORDER — APIXABAN 2.5 MG PO TABS
2.5000 mg | ORAL_TABLET | Freq: Two times a day (BID) | ORAL | Status: DC
  Administered 2022-11-12 – 2022-11-15 (×6): 2.5 mg via ORAL
  Filled 2022-11-12 (×7): qty 1

## 2022-11-12 MED ORDER — AMIODARONE HCL 200 MG PO TABS
200.0000 mg | ORAL_TABLET | Freq: Two times a day (BID) | ORAL | Status: DC
  Administered 2022-11-12 – 2022-11-15 (×6): 200 mg via ORAL
  Filled 2022-11-12 (×6): qty 1

## 2022-11-12 MED ORDER — ALBUTEROL SULFATE (2.5 MG/3ML) 0.083% IN NEBU: 2.5000 mg | INHALATION_SOLUTION | Freq: Four times a day (QID) | RESPIRATORY_TRACT | Status: DC | PRN

## 2022-11-12 NOTE — Assessment & Plan Note (Signed)
Rate controlled Cont eliquis and amiodarone  Hold BB given bradycardia

## 2022-11-12 NOTE — Progress Notes (Signed)
PHARMACIST - PHYSICIAN COMMUNICATION  CONCERNING:  Enoxaparin (Lovenox) for DVT Prophylaxis    RECOMMENDATION: Patient was prescribed enoxaprin '40mg'$  q24 hours for VTE prophylaxis.   Filed Weights   11/12/22 1354  Weight: 54.2 kg (119 lb 7.8 oz)    Body mass index is 22.58 kg/m.  Estimated Creatinine Clearance: 24.3 mL/min (A) (by C-G formula based on SCr of 1.21 mg/dL (H)).   Patient is candidate for enoxaparin '30mg'$  every 24 hours based on CrCl <90m/min or Weight <45kg  DESCRIPTION: [facility]  Patient is now receiving enoxaparin 30 mg every 24 hours    JAlison Murray PharmD Clinical Pharmacist  11/12/2022 4:56 PM

## 2022-11-12 NOTE — Plan of Care (Signed)
  Problem: Education: Goal: Knowledge of General Education information will improve Description: Including pain rating scale, medication(s)/side effects and non-pharmacologic comfort measures Outcome: Progressing   Problem: Clinical Measurements: Goal: Respiratory complications will improve Outcome: Progressing   Problem: Clinical Measurements: Goal: Cardiovascular complication will be avoided Outcome: Progressing   Problem: Elimination: Goal: Will not experience complications related to bowel motility Outcome: Progressing   Problem: Elimination: Goal: Will not experience complications related to urinary retention Outcome: Progressing   Problem: Pain Managment: Goal: General experience of comfort will improve Outcome: Progressing   Problem: Safety: Goal: Ability to remain free from injury will improve Outcome: Progressing

## 2022-11-12 NOTE — H&P (Signed)
History and Physical    Patient: Anne Mitchell [id number] DOB: [birth year] DOA: 11/12/2022 DOS: the patient was seen and examined on 11/12/2022 PCP: Valera Castle, MD  Patient coming from: Home  Chief Complaint:  Chief Complaint  Patient presents with   Shortness of Breath    Patient brought in by EMS for worsening shortness of breath; Recently had pacer placed last week (on demand pacer for symptomatic bradycardia); EMS states she had RA sats of 96% but when she stood up to ambulate, her sats dropped to 80%; Audible wheezing and slightly tachypniec upon arrival to ED   HPI: Anne Mitchell is a 89 y.o. female with medical history significant of diastolic heart failure, emphysema, hypertension, bradycardia arrhythmia status post pacemaker, atrial fibrillation on Eliquis presenting with acute diastolic heart failure.  Patient noted to have been recently mated February 17 through Feb 24th for active issues including A-fib with RVR, hypotension as well as chest pain.  Per the daughter, patient's diuretic regimen including Lasix and spironolactone were DC'd as patient became hypotensive.  Per the daughter, she has had increased work of breathing over the past 2 to 3 days.  No fevers or chills.  No nausea or vomiting.  No abdominal pain.  No reported chest pain.  Worsening orthopnea as well as lower extremity swelling. Presented to the ER afebrile, heart rate 40s to 50s.  Blood pressure 110s to 120s over 50s.  Satting well on room air.  BNP of 1926.  White count 11.9, hemoglobin 10.6.  Creatinine 1.2.  Magnesium 2.5.  Troponin within normal limits.  V paced rhythm on EKG.  Chest x-ray with unchanged bilateral interstitial densities which could reflect edema.     Review of Systems: As mentioned in the history of present illness. All other systems reviewed and are negative. Past Medical History:  Diagnosis Date   Diastolic heart failure (HCC)    Emphysema (subcutaneous) (surgical) resulting  from a procedure    Not correct   Emphysema lung ([place])    Hypertension    Past Surgical History:  Procedure Laterality Date   LEFT HEART CATH AND CORONARY ANGIOGRAPHY N/A 03/22/2020   Procedure: LEFT HEART CATH AND CORONARY ANGIOGRAPHY;  Surgeon: Corey Skains, MD;  Location: [place] [facility];  Service: Cardiovascular;  Laterality: N/A;   PACEMAKER LEADLESS INSERTION N/A 11/05/2022   Procedure: PACEMAKER LEADLESS INSERTION;  Surgeon: Isaias Cowman, MD;  Location: [place] [facility];  Service: Cardiovascular;  Laterality: N/A;   Social History:  reports that she has never smoked. She has never used smokeless tobacco. She reports that she does not drink alcohol and does not use drugs.  Allergies  Allergen Reactions   Other Rash and Swelling    "mycin" meds  Gin  Gin alcohol. Swelling of the genital area   Penicillin G Itching   Banana Swelling   Penicillins Swelling   Sulfa Antibiotics     Family History  Problem Relation Age of Onset   Colon cancer Mother    Hypertension Mother     Prior to Admission medications   Medication Sig Start Date End Date Taking? Authorizing Provider  albuterol (VENTOLIN HFA) 108 (90 Base) MCG/ACT inhaler Inhale 2 puffs into the lungs every 6 (six) hours as needed for wheezing or shortness of breath. 09/16/22   Lavonia Drafts, MD  amiodarone (PACERONE) 200 MG tablet Take 1 tablet (200 mg total) by mouth 2 (two) times daily for 9 days. 11/07/22 11/16/22  Enzo Bi, MD  amiodarone (PACERONE) 200 MG tablet Take 1 tablet (200 mg total) by mouth daily. 11/16/22 02/14/23  Enzo Bi, MD  atorvastatin (LIPITOR) 80 MG tablet Take 1 tablet (80 mg total) by mouth daily. 03/23/20   Loletha Grayer, MD  Calcium Carb-Cholecalciferol (CALCIUM 500 + D PO) Take 1 tablet by mouth as directed. Take 1 tablet three times a week on Sunday, Wednesday and Friday mornings.    [provider]  Cholecalciferol 50 MCG (2000 UT) CAPS Take 2,000 Units by mouth  daily.    [provider]  ELIQUIS 2.5 MG TABS tablet Take 1 tablet (2.5 mg total) by mouth 2 (two) times daily. 11/07/22 02/05/23  Enzo Bi, MD  feeding supplement (ENSURE ENLIVE / ENSURE PLUS) LIQD Take 237 mLs by mouth 2 (two) times daily between meals. 11/07/22   Enzo Bi, MD  metoprolol tartrate (LOPRESSOR) 25 MG tablet Take 0.5 tablets (12.5 mg total) by mouth 2 (two) times daily. 11/07/22 02/05/23  Enzo Bi, MD  Multiple Vitamins-Minerals (VITRUM SENIOR) TABS Take by mouth.    [provider]  sertraline (ZOLOFT) 50 MG tablet Take 50 mg by mouth daily. 01/12/20   [provider]    Physical Exam: Vitals:   11/12/22 1430 11/12/22 1500 11/12/22 1530 11/12/22 1600  BP: (!) 133/55 (!) 113/58 (!) 135/122 (!) 121/57  Pulse: (!) 48 (!) 57 (!) 49 (!) 50  Resp: (!) 22 (!) '27 15 17  '$ Temp:      SpO2: 99% 100% 100% 100%  Weight:      Height:       Physical Exam Constitutional:      General: She is not in acute distress.    Appearance: She is normal weight.  HENT:     Head: Normocephalic and atraumatic.     Mouth/Throat:     Mouth: Mucous membranes are moist.  Eyes:     Pupils: Pupils are equal, round, and reactive to light.  Cardiovascular:     Rate and Rhythm: Normal rate and regular rhythm.  Pulmonary:     Effort: Pulmonary effort is normal.  Abdominal:     General: Bowel sounds are normal.  Musculoskeletal:        General: Swelling present. Normal range of motion.     Cervical back: Normal range of motion.  Skin:    General: Skin is warm.  Neurological:     General: No focal deficit present.  Psychiatric:        Mood and Affect: Mood normal.     Data Reviewed:  There are no new results to review at this time.  DG Chest Portable 1 View CLINICAL DATA:  Suspected PNE.  EXAM: PORTABLE CHEST 1 VIEW  COMPARISON:  Chest radiograph 11/04/2022  FINDINGS: The cardiomediastinal silhouette is unchanged. A new leadless pacemaker has been placed.  There is persistent elevation of the right hemidiaphragm. Pulmonary vascular congestion and diffuse interstitial opacities are similar to the prior study. No sizable pleural effusion or pneumothorax is identified. No acute osseous abnormality is seen.  IMPRESSION: Unchanged bilateral interstitial densities which could reflect edema superimposed on chronic lung disease.  Electronically Signed   By: Logan Bores M.D.   On: 11/12/2022 14:38  Lab Results  Component Value Date   WBC 11.9 (H) 11/12/2022   HGB 10.6 (L) 11/12/2022   HCT 33.7 (L) 11/12/2022   MCV 92.8 11/12/2022   PLT 377 0000000   Last metabolic panel Lab Results  Component Value Date   GLUCOSE  98 11/12/2022   NA 139 11/12/2022   K 4.7 11/12/2022   CL 104 11/12/2022   CO2 25 11/12/2022   BUN 22 11/12/2022   CREATININE 1.21 (H) 11/12/2022   GFRNONAA 43 (L) 11/12/2022   CALCIUM 9.5 11/12/2022   PROT 7.2 11/12/2022   ALBUMIN 3.5 11/12/2022   BILITOT 0.6 11/12/2022   ALKPHOS 81 11/12/2022   AST 46 (H) 11/12/2022   ALT 42 11/12/2022   ANIONGAP 10 11/12/2022    Assessment and Plan: * Acute on chronic diastolic (congestive) heart failure (HCC) Acute decompensated diastolic heart failure in the setting of recent admission for same Home Lasix and and Aldactone held secondary to hypotension BNP 1900 which is more than doubled from BMP 10 days ago (770) Status post 60 mg of IV Lasix in the ER with roughly 2 to 300 cc urine output so far Systolic blood pressures remain in the 120s Will reinitiate diuretic regimen delicately so as to avoid recurrence of hypotension EF at 55-60% with g1dd on 2D ECHO 10/28/22  Monitor UOP  Strict Is and Os and daily weights  Cardiology consult as clinically indicated.    Bradyarrhythmia Status post pacemaker placement on November 05, 2022 Heart rate 50s to 60s V paced rhythm on EKG Hold BB  Follow    Coronary artery disease No active chest pain no with no chest pain during  prior admission that was felt to be atypical nonobstructive CAD on cath 2021  Monitor for now Continue statin Unable to perform stress test during last admission with noted bradycardia Follow  AF (paroxysmal atrial fibrillation) (HCC) Rate controlled Cont eliquis and amiodarone  Hold BB given bradycardia     Essential hypertension Lower limit of normal blood pressures during last admission Blood pressures 120s and 130s over 50s at present Titrate regimen so as to avoid recurrence of hypotension with diuresis Follow      Advance Care Planning:   Code Status: Full Code   Consults: Consider cardiology consult as clinically indicated   Family Communication: Daughter Kamali at the bedside.   Severity of Illness: The appropriate patient status for this patient is OBSERVATION. Observation status is judged to be reasonable and necessary in order to provide the required intensity of service to ensure the patient's safety. The patient's presenting symptoms, physical exam findings, and initial radiographic and laboratory data in the context of their medical condition is felt to place them at decreased risk for further clinical deterioration. Furthermore, it is anticipated that the patient will be medically stable for discharge from the hospital within 2 midnights of admission.   Author: Deneise Lever, MD 11/12/2022 5:57 PM  For on call review [email or web address].

## 2022-11-12 NOTE — ED Provider Notes (Signed)
[facility] Provider Note    Event Date/Time   First MD Initiated Contact with Patient 11/12/22 1400     (approximate)   History   Shortness of Breath (Patient brought in by EMS for worsening shortness of breath; Recently had pacer placed last week (on demand pacer for symptomatic bradycardia); EMS states she had RA sats of 96% but when she stood up to ambulate, her sats dropped to 80%; Audible wheezing and slightly tachypniec upon arrival to ED)   HPI  Anne Mitchell is a 89 y.o. female with recent pacemaker placed for symptomatic bradycardia presents to the ER for evaluation of shortness of breath over the past few days.  While at rest she feels okay but with any exertion she becomes very dyspneic.  Reported with EMS her sats dropped down to the 80s.  Did have some wheezing reportedly tachypneic upon arrival but on my evaluation not having any wheezing.  She does feel short of breath at rest.  Does endorse orthopnea.  She is on Eliquis for history of A-fib.  Denies any fevers or chills     Physical Exam   Triage Vital Signs: ED Triage Vitals  Enc Vitals Group     BP 11/12/22 1358 (!) 132/57     Pulse Rate 11/12/22 1358 (!) 57     Resp 11/12/22 1358 (!) 29     Temp 11/12/22 1358 97.6 F (36.4 C)     Temp src --      SpO2 11/12/22 1358 99 %     Weight 11/12/22 1354 119 lb 7.8 oz (54.2 kg)     Height 11/12/22 1354 '5\' 1"'$  (1.549 m)     Head Circumference --      Peak Flow --      Pain Score 11/12/22 1354 0     Pain Loc --      Pain Edu? --      Excl. in [place]? --     Most recent vital signs: Vitals:   11/12/22 1358  BP: (!) 132/57  Pulse: (!) 57  Resp: (!) 29  Temp: 97.6 F (36.4 C)  SpO2: 99%     Constitutional: Alert  Eyes: Conjunctivae are normal.  Head: Atraumatic. Nose: No congestion/rhinnorhea. Mouth/Throat: Mucous membranes are moist.   Neck: Painless ROM.  Cardiovascular:   Good peripheral circulation. Respiratory:Mild tachypnea  no use of accessory muscles.  No crackles or rhonchi. Gastrointestinal: Soft and nontender.  Musculoskeletal:  no deformity Neurologic:  MAE spontaneously. No gross focal neurologic deficits are appreciated.  Skin:  Skin is warm, dry and intact. No rash noted. Psychiatric: Mood and affect are normal. Speech and behavior are normal.    ED Results / Procedures / Treatments   Labs (all labs ordered are listed, but only abnormal results are displayed) Labs Reviewed  BRAIN NATRIURETIC PEPTIDE - Abnormal; Notable for the following components:      Result Value   B Natriuretic Peptide 1,926.4 (*)    All other components within normal limits  CBC WITH DIFFERENTIAL/PLATELET - Abnormal; Notable for the following components:   WBC 11.9 (*)    RBC 3.63 (*)    Hemoglobin 10.6 (*)    HCT 33.7 (*)    RDW 15.6 (*)    Neutro Abs 9.3 (*)    All other components within normal limits  COMPREHENSIVE METABOLIC PANEL - Abnormal; Notable for the following components:   Creatinine, Ser 1.21 (*)    AST 46 (*)  GFR, Estimated 43 (*)    All other components within normal limits  MAGNESIUM - Abnormal; Notable for the following components:   Magnesium 2.5 (*)    All other components within normal limits  RESP PANEL BY RT-PCR (RSV, FLU A&B, COVID)  [id number]  LACTIC ACID, PLASMA  LACTIC ACID, PLASMA  TROPONIN I (HIGH SENSITIVITY)     EKG  ED ECG REPORT I, Merlyn Lot, the attending physician, personally viewed and interpreted this ECG.   Date: 11/12/2022  EKG Time: 13:56  Rate: 60  Rhythm: v-paced  Axis: left  Intervals: normal qt  ST&T Change: paced rhythm    RADIOLOGY Please see ED Course for my review and interpretation.  I personally reviewed all radiographic images ordered to evaluate for the above acute complaints and reviewed radiology reports and findings.  These findings were personally discussed with the patient.  Please see medical record for radiology  report.    PROCEDURES:  Critical Care performed: Procedures   MEDICATIONS ORDERED IN ED: Medications  ipratropium-albuterol (DUONEB) 0.5-2.5 (3) MG/3ML nebulizer solution 3 mL (has no administration in time range)  furosemide (LASIX) injection 60 mg (has no administration in time range)     IMPRESSION / MDM / ASSESSMENT AND PLAN / ED COURSE  I reviewed the triage vital signs and the nursing notes.                              Differential diagnosis includes, but is not limited to, Asthma, copd, CHF, pna, ptx, malignancy, Pe, anemia  Patient presenting to the ER for evaluation of symptoms as described above.  Based on symptoms, risk factors and considered above differential, this presenting complaint could reflect a potentially life-threatening illness therefore the patient will be placed on continuous pulse oximetry and telemetry for monitoring.  Laboratory evaluation will be sent to evaluate for the above complaints.      Clinical Course as of 11/12/22 1506  Thu Nov 12, 2022  1448 DG Chest Portable 1 View [PR]  [id number] Chest x-ray my review and interpretation without evidence of consolidation there is signs of pulmonary edema.  Her BNP is significantly elevated and as they discontinued her Lasix over a week ago I think that her presentation in acute respiratory failure with hypoxia secondary to pulmonary edema and acute on chronic CHF exacerbation.  Will order IV Lasix.  Will consult hospitalist for admission [PR]    Clinical Course User Index [PR] Merlyn Lot, MD     FINAL CLINICAL IMPRESSION(S) / ED DIAGNOSES   Final diagnoses:  Acute respiratory failure with hypoxia ([place])  Acute on chronic congestive heart failure, unspecified heart failure type [facility])     Rx / DC Orders   ED Discharge Orders     None        Note:  This document was prepared using Dragon voice recognition software and may include unintentional dictation errors.    Merlyn Lot,  MD 11/12/22 [phone]

## 2022-11-12 NOTE — Progress Notes (Addendum)
Pt came in for CHF exacerbation and have a pacemaker placed 11/05/22 but no tele order. NP Randol Kern made aware and states will place order.. Will continue to monitor.

## 2022-11-12 NOTE — ED Notes (Signed)
Informed RN bed assigned 

## 2022-11-12 NOTE — Assessment & Plan Note (Signed)
Status post pacemaker placement on November 05, 2022 Heart rate 50s to 60s V paced rhythm on EKG Hold BB  Follow

## 2022-11-12 NOTE — ED Triage Notes (Signed)
Patient brought in by EMS for worsening shortness of breath; Recently had pacer placed last week (on demand pacer for symptomatic bradycardia); EMS states she had RA sats of 96% but when she stood up to ambulate, her sats dropped to 80%; Audible wheezing and slightly tachypniec upon arrival to ED

## 2022-11-12 NOTE — Assessment & Plan Note (Signed)
Lower limit of normal blood pressures during last admission Blood pressures 120s and 130s over 50s at present Titrate regimen so as to avoid recurrence of hypotension with diuresis Follow

## 2022-11-12 NOTE — Assessment & Plan Note (Signed)
Acute decompensated diastolic heart failure in the setting of recent admission for same Home Lasix and and Aldactone held secondary to hypotension BNP 1900 which is more than doubled from BMP 10 days ago (770) Status post 60 mg of IV Lasix in the ER with roughly 2 to 300 cc urine output so far Systolic blood pressures remain in the 120s Will reinitiate diuretic regimen delicately so as to avoid recurrence of hypotension EF at 55-60% with g1dd on 2D ECHO 10/28/22  Monitor UOP  Strict Is and Os and daily weights  Cardiology consult as clinically indicated.

## 2022-11-12 NOTE — Assessment & Plan Note (Signed)
No active chest pain no with no chest pain during prior admission that was felt to be atypical nonobstructive CAD on cath 2021  Monitor for now Continue statin Unable to perform stress test during last admission with noted bradycardia Follow

## 2022-11-13 ENCOUNTER — Encounter: Payer: Self-pay | Admitting: Family Medicine

## 2022-11-13 DIAGNOSIS — Z91018 Allergy to other foods: Secondary | ICD-10-CM | POA: Diagnosis not present

## 2022-11-13 DIAGNOSIS — Z950 Presence of cardiac pacemaker: Secondary | ICD-10-CM | POA: Diagnosis not present

## 2022-11-13 DIAGNOSIS — I480 Paroxysmal atrial fibrillation: Secondary | ICD-10-CM | POA: Diagnosis present

## 2022-11-13 DIAGNOSIS — Z8249 Family history of ischemic heart disease and other diseases of the circulatory system: Secondary | ICD-10-CM | POA: Diagnosis not present

## 2022-11-13 DIAGNOSIS — J9601 Acute respiratory failure with hypoxia: Secondary | ICD-10-CM | POA: Diagnosis present

## 2022-11-13 DIAGNOSIS — I2510 Atherosclerotic heart disease of native coronary artery without angina pectoris: Secondary | ICD-10-CM | POA: Diagnosis present

## 2022-11-13 DIAGNOSIS — Z880 Allergy status to penicillin: Secondary | ICD-10-CM | POA: Diagnosis not present

## 2022-11-13 DIAGNOSIS — Z1152 Encounter for screening for COVID-19: Secondary | ICD-10-CM | POA: Diagnosis not present

## 2022-11-13 DIAGNOSIS — I5033 Acute on chronic diastolic (congestive) heart failure: Secondary | ICD-10-CM | POA: Diagnosis present

## 2022-11-13 DIAGNOSIS — I110 Hypertensive heart disease with heart failure: Secondary | ICD-10-CM | POA: Diagnosis present

## 2022-11-13 DIAGNOSIS — J439 Emphysema, unspecified: Secondary | ICD-10-CM | POA: Diagnosis present

## 2022-11-13 DIAGNOSIS — Z7901 Long term (current) use of anticoagulants: Secondary | ICD-10-CM | POA: Diagnosis not present

## 2022-11-13 DIAGNOSIS — I498 Other specified cardiac arrhythmias: Secondary | ICD-10-CM | POA: Diagnosis present

## 2022-11-13 DIAGNOSIS — Z882 Allergy status to sulfonamides status: Secondary | ICD-10-CM | POA: Diagnosis not present

## 2022-11-13 DIAGNOSIS — Z888 Allergy status to other drugs, medicaments and biological substances status: Secondary | ICD-10-CM | POA: Diagnosis not present

## 2022-11-13 DIAGNOSIS — Z79899 Other long term (current) drug therapy: Secondary | ICD-10-CM | POA: Diagnosis not present

## 2022-11-13 LAB — CBC
HCT: 30 % — ABNORMAL LOW (ref 36.0–46.0)
Hemoglobin: 9.5 g/dL — ABNORMAL LOW (ref 12.0–15.0)
MCH: 28.9 pg (ref 26.0–34.0)
MCHC: 31.7 g/dL (ref 30.0–36.0)
MCV: 91.2 fL (ref 80.0–100.0)
Platelets: 326 10*3/uL (ref 150–400)
RBC: 3.29 MIL/uL — ABNORMAL LOW (ref 3.87–5.11)
RDW: 15.5 % (ref 11.5–15.5)
WBC: 10.3 10*3/uL (ref 4.0–10.5)
nRBC: 0 % (ref 0.0–0.2)

## 2022-11-13 LAB — COMPREHENSIVE METABOLIC PANEL
ALT: 30 U/L (ref 0–44)
AST: 28 U/L (ref 15–41)
Albumin: 3 g/dL — ABNORMAL LOW (ref 3.5–5.0)
Alkaline Phosphatase: 66 U/L (ref 38–126)
Anion gap: 9 (ref 5–15)
BUN: 23 mg/dL (ref 8–23)
CO2: 26 mmol/L (ref 22–32)
Calcium: 8.7 mg/dL — ABNORMAL LOW (ref 8.9–10.3)
Chloride: 105 mmol/L (ref 98–111)
Creatinine, Ser: 1.34 mg/dL — ABNORMAL HIGH (ref 0.44–1.00)
GFR, Estimated: 38 mL/min — ABNORMAL LOW (ref >60)
Glucose, Bld: 95 mg/dL (ref 70–99)
Potassium: 4.2 mmol/L (ref 3.5–5.1)
Sodium: 140 mmol/L (ref 135–145)
Total Bilirubin: 0.5 mg/dL (ref 0.3–1.2)
Total Protein: 6.1 g/dL — ABNORMAL LOW (ref 6.5–8.1)

## 2022-11-13 MED ORDER — METOPROLOL TARTRATE 25 MG PO TABS
12.5000 mg | ORAL_TABLET | Freq: Two times a day (BID) | ORAL | Status: DC
  Administered 2022-11-13 – 2022-11-15 (×4): 12.5 mg via ORAL
  Filled 2022-11-13 (×5): qty 1

## 2022-11-13 MED ORDER — ADULT MULTIVITAMIN W/MINERALS CH
1.0000 | ORAL_TABLET | Freq: Every day | ORAL | Status: DC
  Administered 2022-11-13 – 2022-11-15 (×3): 1 via ORAL
  Filled 2022-11-13 (×3): qty 1

## 2022-11-13 MED ORDER — ORAL CARE MOUTH RINSE: 15.0000 mL | OROMUCOSAL | Status: DC | PRN

## 2022-11-13 MED ORDER — SERTRALINE HCL 50 MG PO TABS
50.0000 mg | ORAL_TABLET | Freq: Every day | ORAL | Status: DC
  Administered 2022-11-13 – 2022-11-15 (×3): 50 mg via ORAL
  Filled 2022-11-13 (×3): qty 1

## 2022-11-13 MED ORDER — FUROSEMIDE 10 MG/ML IJ SOLN
20.0000 mg | Freq: Two times a day (BID) | INTRAMUSCULAR | Status: DC
  Administered 2022-11-13 (×2): 20 mg via INTRAVENOUS
  Filled 2022-11-13 (×2): qty 2

## 2022-11-13 NOTE — Plan of Care (Signed)
  Problem: Health Behavior/Discharge Planning: Goal: Ability to manage health-related needs will improve Outcome: Progressing   Problem: Clinical Measurements: Goal: Will remain free from infection Outcome: Progressing   Problem: Clinical Measurements: Goal: Respiratory complications will improve Outcome: Progressing   Problem: Clinical Measurements: Goal: Cardiovascular complication will be avoided Outcome: Progressing   Problem: Pain Managment: Goal: General experience of comfort will improve Outcome: Progressing   Problem: Safety: Goal: Ability to remain free from injury will improve Outcome: Progressing   Problem: Elimination: Goal: Will not experience complications related to urinary retention Outcome: Progressing

## 2022-11-13 NOTE — Consult Note (Signed)
   Heart Failure Nurse Navigator Note  HFimEF 5 to 60%.  Grade 1 diastolic dysfunction.  She presented to the emergency room with complaints of worsening shortness of breath the last 2 to 3 days.  BNP was 1926.  This x-ray with unchanged bilateral interstitial densities which could reflect edema.  States with her last hospitalization her Lasix and spironolactone had been discontinued due to hypotension.  Comorbidities:  Emphysema Hypertension Bradycardia status post pacemaker Atrial fibrillation on Eliquis   Medications:  Amiodarone 200 mg 2 times a day Apixaban 2.5 mg 2 times a day Atorvastatin 80 mg daily Furosemide 20 mg IV 2 times a day  Labs:  Sodium 140, potassium 4.2, chloride 105, CO2 26, BUN 23, creatinine 1.34, estimated GFR 38 Weight not documented Intake 150 mL Output 300 mL  Initial meeting with patient lying quietly in bed in no acute distress.  No family members present.   States that she lives with her daughter and her daughter works every day.  Patient states that she weighs herself every morning and had noted that her weight went from 114.2 up to 119 pounds just prior to admission.  She attributed this to her diuretics being discontinued.  And over daily weights and reporting 2 pound weight gain overnight or total of 5 pounds within the week.  Voices understanding.  Discussed the importance of fluid restriction of no more than 64 ounces daily and all that entails.  Also discussed sodium restriction.  Patient states that she uses salt but not as much as she has in the past.  Splane the reasoning behind taking the saltshaker off the table.  She reads labels.  States that she tries to remain active with taking 2000-4000 steps daily around their apartment.  She was given living with heart failure teaching booklet, zone magnet, info on low-sodium and heart failure along with weight chart.  Pricilla Riffle RN [facility]

## 2022-11-13 NOTE — Progress Notes (Signed)
Initial Nutrition Assessment  DOCUMENTATION CODES:   Not applicable  INTERVENTION:   -Magic cup BID with meals, each supplement provides 290 kcal and 9 grams of protein  -MVI with minerals daily -Reviewed principles of CHF self-management; RD provided "Low Sodium Nutrition Therapy" handout and attached to AVS/ discharge summary   NUTRITION DIAGNOSIS:   Increased nutrient needs related to chronic illness (CHF) as evidenced by estimated needs.  GOAL:   Patient will meet greater than or equal to 90% of their needs  MONITOR:   PO intake, Supplement acceptance  REASON FOR ASSESSMENT:   Malnutrition Screening Tool    ASSESSMENT:   Pt with medical history significant of diastolic heart failure, emphysema, hypertension, bradycardia arrhythmia status post pacemaker, atrial fibrillation on Eliquis presenting with acute diastolic heart failure.  Pt admitted with CHF.    Reviewed I/O's: -150 ml x 24 hours  UOP: 300 ml x 24 hours  Spoke with pt at bedside, who reports feeling better today. She shares she has a good appetite, but would eat better "if I just had a smidge of salt". Noted meal completions 20-50%.   Per pt, she lives with her daughter, who grocery shops for her. Pt consumes 3 meals per day (Breakfast: eggs and cereal or oatmeal, Lunch: sandwich, Dinner: frozen dinner).   Pt denies any difficulty obtaining her medications. Per Heart Failure Navigator notes, diuretics were discontinued last admission. Pt able to recall visit with Heart Failure Navigator and verbalize needs for sodium and fluid restrictions as well as weighing herself daily.   Reviewed wt hx; wt has been stable over the past 2 months. Per pt, she weighs herself daily and UBW is around 115#. Per pt, her weigh increased to 119# a few day PTA.   Discussed importance of good meal and supplement intake to promote healing. Pt was refusing Ensure supplements at last visit, so will trial Magic CUp as pt shares she  likes ice cream.   Medications reviewed and include lasix.  Labs reviewed: CBGS: 96 (inpatient orders for glycemic control are none).    NUTRITION - FOCUSED PHYSICAL EXAM:  Flowsheet Row Most Recent Value  Orbital Region No depletion  Upper Arm Region No depletion  Thoracic and Lumbar Region No depletion  Buccal Region No depletion  Temple Region No depletion  Clavicle Bone Region No depletion  Clavicle and Acromion Bone Region No depletion  Scapular Bone Region No depletion  Dorsal Hand Mild depletion  Patellar Region No depletion  Anterior Thigh Region No depletion  Posterior Calf Region No depletion  Edema (RD Assessment) None  Eyes Reviewed  Mouth Reviewed  Skin Reviewed  Nails Reviewed       Diet Order:   Diet Order             Diet 2 gram sodium Fluid consistency: Thin  Diet effective now                   EDUCATION NEEDS:   Education needs have been addressed  Skin:  Skin Assessment: Reviewed RN Assessment  Last BM:  11/12/22  Height:   Ht Readings from Last 1 Encounters:  11/12/22 '5\' 1"'$  (1.549 m)    Weight:   Wt Readings from Last 1 Encounters:  11/12/22 54.4 kg    Ideal Body Weight:  47.7 kg  BMI:  Body mass index is 22.67 kg/m.  Estimated Nutritional Needs:   Kcal:  1350-1550  Protein:  65-80 grams  Fluid:  > 1.3 L  Loistine Chance, RD, LDN, [place] Registered Dietitian II Certified Diabetes Care and Education Specialist Please refer to [facility] for RD and/or RD on-call/weekend/after hours pager

## 2022-11-13 NOTE — Progress Notes (Addendum)
Progress Note    Anne Mitchell  [id number] DOB: Aug 04, 1932  DOA: 11/12/2022 PCP: Valera Castle, MD      Brief Narrative:    Medical records reviewed and are as summarized below:  Anne Mitchell is a 89 y.o. 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  She presented to the hospital with shortness of breath and oxygen saturation of 80% on room air with ambulation.   She was admitted to the hospital for acute on chronic diastolic CHF and acute hypoxic respiratory failure.      Assessment/Plan:   Principal Problem:   Acute on chronic diastolic (congestive) heart failure (HCC) Active Problems:   Coronary artery disease   Bradyarrhythmia   Essential hypertension   AF (paroxysmal atrial fibrillation) (HCC)   S/P placement of cardiac pacemaker   Acute respiratory failure with hypoxia (HCC)    Body mass index is 22.67 kg/m.   Acute on chronic diastolic CHF: Continue IV Lasix.  Monitor BMP, daily weight and urine output   Acute hypoxic respiratory failure: Continue 2 L/min oxygen via nasal cannula.  Wean off oxygen as able.   Paroxysmal atrial fibrillation: Continue Eliquis and amiodarone.  Restart metoprolol because she has had issues with rapid atrial fibrillation in the past.   Recent placement of permanent pacemaker on 11/05/2022 for bradycardia   Diet Order             Diet 2 gram sodium Fluid consistency: Thin  Diet effective now                            Consultants: None  Procedures: None    Medications:    amiodarone  200 mg Oral BID   apixaban  2.5 mg Oral BID    atorvastatin  80 mg Oral Daily   furosemide  20 mg Intravenous BID   metoprolol tartrate  12.5 mg Oral BID   multivitamin with minerals  1 tablet Oral Daily   sertraline  50 mg Oral Daily   Continuous Infusions:   Anti-infectives (From admission, onward)    None              Family Communication/Anticipated D/C date and plan/Code Status   DVT prophylaxis: apixaban (ELIQUIS) tablet 2.5 mg Start: 11/12/22 2200 SCDs Start: 11/12/22 1653 apixaban (ELIQUIS) tablet 2.5 mg     Code Status: Full Code  Family Communication: Plan discussed with Heidi, daughter, over the phone Disposition Plan: Plan to discharge home in 1 to 2 days   Status is: Inpatient Remains inpatient appropriate because: IV Lasix for acute CHF         Subjective:   Interval events noted.  She complains of shortness of breath but breathing is better compared to yesterday.  No chest pain.  Objective:    Vitals:   11/13/22 0324 11/13/22 0345 11/13/22 0817 11/13/22 1216  BP: 115/60  115/62 125/78  Pulse: (!) 50  94 (!) 51  Resp: '18  19 18  '$ Temp: (!) 97.4 F (36.3 C) 97.7 F (36.5 C) 97.8 F (36.6 C) 97.6 F (36.4 C)  TempSrc:  Oral Oral Oral  SpO2: 95%  100% 100%  Weight:      Height:  No data found.   Intake/Output Summary (Last 24 hours) at 11/13/2022 1607 Last data filed at 11/13/2022 1418 Gross per 24 hour  Intake 630 ml  Output 300 ml  Net 330 ml   Filed Weights   11/12/22 1354 11/12/22 1846  Weight: 54.2 kg 54.4 kg    Exam:  GEN: NAD SKIN: Warm and dry EYES: No pallor or icterus ENT: MMM CV: RRR PULM: Bibasilar rales, no wheezing ABD: soft, ND, NT, +BS CNS: AAO x 3, non focal EXT: No edema or tenderness      Data Reviewed:   I have personally reviewed following labs and imaging studies:  Labs: Labs show the following:   Basic Metabolic Panel: Recent Labs  Lab 11/12/22 1415 11/13/22 0431  NA 139 140  K 4.7 4.2  CL 104 105  CO2 25 26  GLUCOSE  98 95  BUN 22 23  CREATININE 1.21* 1.34*  CALCIUM 9.5 8.7*  MG 2.5*  --    GFR Estimated Creatinine Clearance: 21.9 mL/min (A) (by C-G formula based on SCr of 1.34 mg/dL (H)). Liver Function Tests: Recent Labs  Lab 11/12/22 1415 11/13/22 0431  AST 46* 28  ALT 42 30  ALKPHOS 81 66  BILITOT 0.6 0.5  PROT 7.2 6.1*  ALBUMIN 3.5 3.0*   No results for input(s): "LIPASE", "AMYLASE" in the last 168 hours. No results for input(s): "AMMONIA" in the last 168 hours. Coagulation profile No results for input(s): "INR", "PROTIME" in the last 168 hours.  CBC: Recent Labs  Lab 11/12/22 1415 11/13/22 0431  WBC 11.9* 10.3  NEUTROABS 9.3*  --   HGB 10.6* 9.5*  HCT 33.7* 30.0*  MCV 92.8 91.2  PLT 377 326   Cardiac Enzymes: No results for input(s): "CKTOTAL", "CKMB", "CKMBINDEX", "TROPONINI" in the last 168 hours. BNP (last 3 results) No results for input(s): "PROBNP" in the last 8760 hours. CBG: No results for input(s): "GLUCAP" in the last 168 hours. D-Dimer: No results for input(s): "DDIMER" in the last 72 hours. Hgb A1c: No results for input(s): "HGBA1C" in the last 72 hours. Lipid Profile: No results for input(s): "CHOL", "HDL", "LDLCALC", "TRIG", "CHOLHDL", "LDLDIRECT" in the last 72 hours. Thyroid function studies: No results for input(s): "TSH", "T4TOTAL", "T3FREE", "THYROIDAB" in the last 72 hours.  Invalid input(s): "FREET3" Anemia work up: No results for input(s): "VITAMINB12", "FOLATE", "FERRITIN", "TIBC", "IRON", "RETICCTPCT" in the last 72 hours. Sepsis Labs: Recent Labs  Lab 11/12/22 1415 11/13/22 0431  WBC 11.9* 10.3  LATICACIDVEN 1.6  --     Microbiology Recent Results (from the past 240 hour(s))  Resp panel by RT-PCR (RSV, Flu A&B, Covid) Anterior Nasal Swab     Status: None   Collection Time: 11/12/22  2:15 PM   Specimen: Anterior Nasal Swab  Result Value Ref Range Status   SARS Coronavirus 2 by RT PCR NEGATIVE NEGATIVE Final    Comment:  (NOTE) SARS-CoV-2 target nucleic acids are NOT DETECTED.  The SARS-CoV-2 RNA is generally detectable in upper respiratory specimens during the acute phase of infection. The lowest concentration of SARS-CoV-2 viral copies this assay can detect is 138 copies/mL. A negative result does not preclude SARS-Cov-2 infection and should not be used as the sole basis for treatment or other patient management decisions. A negative result may occur with  improper specimen collection/handling, submission of specimen other than nasopharyngeal swab, presence of viral mutation(s) within the areas targeted by this assay, and inadequate number of viral copies(<138 copies/mL). A negative result  must be combined with clinical observations, patient history, and epidemiological information. The expected result is Negative.  Fact Sheet for Patients:  EntrepreneurPulse.com.au  Fact Sheet for Healthcare Providers:  IncredibleEmployment.be  This test is no t yet approved or cleared by the [place] FDA and  has been authorized for detection and/or diagnosis of SARS-CoV-2 by FDA under an Emergency Use Authorization (EUA). This EUA will remain  in effect (meaning this test can be used) for the duration of the COVID-19 declaration under Section 564(b)(1) of the Act, 21 U.S.C.section 360bbb-3(b)(1), unless the authorization is terminated  or revoked sooner.       Influenza A by PCR NEGATIVE NEGATIVE Final   Influenza B by PCR NEGATIVE NEGATIVE Final    Comment: (NOTE) The Xpert Xpress SARS-CoV-2/FLU/RSV plus assay is intended as an aid in the diagnosis of influenza from Nasopharyngeal swab specimens and should not be used as a sole basis for treatment. Nasal washings and aspirates are unacceptable for Xpert Xpress SARS-CoV-2/FLU/RSV testing.  Fact Sheet for Patients: EntrepreneurPulse.com.au  Fact Sheet for Healthcare  Providers: IncredibleEmployment.be  This test is not yet approved or cleared by the [place] FDA and has been authorized for detection and/or diagnosis of SARS-CoV-2 by FDA under an Emergency Use Authorization (EUA). This EUA will remain in effect (meaning this test can be used) for the duration of the COVID-19 declaration under Section 564(b)(1) of the Act, 21 U.S.C. section 360bbb-3(b)(1), unless the authorization is terminated or revoked.     Resp Syncytial Virus by PCR NEGATIVE NEGATIVE Final    Comment: (NOTE) Fact Sheet for Patients: EntrepreneurPulse.com.au  Fact Sheet for Healthcare Providers: IncredibleEmployment.be  This test is not yet approved or cleared by the [place] FDA and has been authorized for detection and/or diagnosis of SARS-CoV-2 by FDA under an Emergency Use Authorization (EUA). This EUA will remain in effect (meaning this test can be used) for the duration of the COVID-19 declaration under Section 564(b)(1) of the Act, 21 U.S.C. section 360bbb-3(b)(1), unless the authorization is terminated or revoked.  Performed at [facility], [address] [place], [place] [address]     Procedures and diagnostic studies:  DG Chest Portable 1 View  Result Date: 11/12/2022 CLINICAL DATA:  Suspected PNE. EXAM: PORTABLE CHEST 1 VIEW COMPARISON:  Chest radiograph 11/04/2022 FINDINGS: The cardiomediastinal silhouette is unchanged. A new leadless pacemaker has been placed. There is persistent elevation of the right hemidiaphragm. Pulmonary vascular congestion and diffuse interstitial opacities are similar to the prior study. No sizable pleural effusion or pneumothorax is identified. No acute osseous abnormality is seen. IMPRESSION: Unchanged bilateral interstitial densities which could reflect edema superimposed on chronic lung disease. Electronically Signed   By: Logan Bores M.D.   On: 11/12/2022  14:38               LOS: 0 days   Issabela Lesko  [facility]   Pager on [email or web address]. If 7PM-7AM, please contact night-coverage at [email or web address]     11/13/2022, 4:07 PM

## 2022-11-13 NOTE — Discharge Instructions (Signed)

## 2022-11-14 DIAGNOSIS — I5033 Acute on chronic diastolic (congestive) heart failure: Secondary | ICD-10-CM | POA: Diagnosis not present

## 2022-11-14 DIAGNOSIS — I480 Paroxysmal atrial fibrillation: Secondary | ICD-10-CM | POA: Diagnosis not present

## 2022-11-14 DIAGNOSIS — J9601 Acute respiratory failure with hypoxia: Secondary | ICD-10-CM

## 2022-11-14 LAB — BASIC METABOLIC PANEL
Anion gap: 10 (ref 5–15)
BUN: 26 mg/dL — ABNORMAL HIGH (ref 8–23)
CO2: 22 mmol/L (ref 22–32)
Calcium: 8.5 mg/dL — ABNORMAL LOW (ref 8.9–10.3)
Chloride: 102 mmol/L (ref 98–111)
Creatinine, Ser: 1.16 mg/dL — ABNORMAL HIGH (ref 0.44–1.00)
GFR, Estimated: 45 mL/min — ABNORMAL LOW (ref >60)
Glucose, Bld: 98 mg/dL (ref 70–99)
Potassium: 3.7 mmol/L (ref 3.5–5.1)
Sodium: 134 mmol/L — ABNORMAL LOW (ref 135–145)

## 2022-11-14 LAB — MAGNESIUM: Magnesium: 2 mg/dL (ref 1.7–2.4)

## 2022-11-14 MED ORDER — FUROSEMIDE 10 MG/ML IJ SOLN
20.0000 mg | Freq: Two times a day (BID) | INTRAMUSCULAR | Status: DC
  Administered 2022-11-14 (×2): 20 mg via INTRAVENOUS
  Filled 2022-11-14 (×3): qty 2

## 2022-11-14 MED ORDER — ACETAMINOPHEN 325 MG PO TABS
650.0000 mg | ORAL_TABLET | Freq: Four times a day (QID) | ORAL | Status: DC | PRN
  Administered 2022-11-14: 650 mg via ORAL
  Filled 2022-11-14: qty 2

## 2022-11-14 NOTE — Progress Notes (Signed)
Progress Note    Anne Mitchell  [id number] DOB: 26-May-1932  DOA: 11/12/2022 PCP: Valera Castle, MD      Brief Narrative:    Medical records reviewed and are as summarized below:  Anne Mitchell is a 89 y.o. 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  She presented to the hospital with shortness of breath and oxygen saturation of 80% on room air with ambulation.   She was admitted to the hospital for acute on chronic diastolic CHF and acute hypoxic respiratory failure.      Assessment/Plan:   Principal Problem:   Acute on chronic diastolic (congestive) heart failure (HCC) Active Problems:   Coronary artery disease   Bradyarrhythmia   Essential hypertension   AF (paroxysmal atrial fibrillation) (HCC)   S/P placement of cardiac pacemaker   Acute respiratory failure with hypoxia (HCC)    Body mass index is 21.67 kg/m.   Acute on chronic diastolic CHF: Continue IV Lasix.  Plan to change to oral Lasix later in the day.  Monitor BMP, daily weight and urine output   Acute hypoxic respiratory failure: Improved.  She was able to ambulate on room air and oxygen saturation was 92% on room air while ambulating.  However, she developed exertional  shortness of breath and fatigue.   Paroxysmal atrial fibrillation: Continue metoprolol, Eliquis and amiodarone.     Recent placement of permanent pacemaker on 11/05/2022 for bradycardia   Diet Order             Diet 2 gram sodium Fluid consistency: Thin  Diet effective now                             Consultants: None  Procedures: None    Medications:    amiodarone  200 mg Oral BID   apixaban  2.5 mg Oral BID   atorvastatin  80 mg Oral Daily   furosemide  20 mg Intravenous BID   metoprolol tartrate  12.5 mg Oral BID   multivitamin with minerals  1 tablet Oral Daily   sertraline  50 mg Oral Daily   Continuous Infusions:   Anti-infectives (From admission, onward)    None              Family Communication/Anticipated D/C date and plan/Code Status   DVT prophylaxis: apixaban (ELIQUIS) tablet 2.5 mg Start: 11/12/22 2200 SCDs Start: 11/12/22 1653 apixaban (ELIQUIS) tablet 2.5 mg     Code Status: Full Code  Family Communication: None Disposition Plan: Plan to discharge home tomorrow   Status is: Inpatient Remains inpatient appropriate because: IV Lasix for acute CHF         Subjective:   Interval events noted.  She feels better today.  No shortness of breath or chest pain  Objective:    Vitals:   11/13/22 2132 11/13/22 2355 11/14/22 0322 11/14/22 0741  BP:  101/60 (!) 145/67 (!) 114/45  Pulse: (!) 55 (!) 50 (!) 53 (!) 49  Resp:  '20 18 18  '$ Temp:  97.7 F (36.5 C) 97.7 F (36.5 C) 98.2 F (36.8 C)  TempSrc:    Oral  SpO2:  100% 99% 99%  Weight:    52 kg  Height:       No data found.   Intake/Output Summary (Last 24 hours) at 11/14/2022 1200 Last data filed at 11/14/2022 1000 Gross per 24 hour  Intake 800 ml  Output 250 ml  Net 550 ml   Filed Weights   11/12/22 1354 11/12/22 1846 11/14/22 0741  Weight: 54.2 kg 54.4 kg 52 kg    Exam:   GEN: NAD SKIN:  Warm and dry EYES: EOMI ENT: MMM CV: RRR PULM: Mild bibasilar rales, no wheezing ABD: soft, ND, NT, +BS CNS: AAO x 3, non focal EXT: No edema or tenderness   Data Reviewed:   I have personally reviewed following labs and imaging studies:  Labs: Labs show the following:   Basic Metabolic Panel: Recent Labs  Lab 11/12/22 1415 11/13/22 0431  11/14/22 0751  NA 139 140 134*  K 4.7 4.2 3.7  CL 104 105 102  CO2 '25 26 22  '$ GLUCOSE 98 95 98  BUN 22 23 26*  CREATININE 1.21* 1.34* 1.16*  CALCIUM 9.5 8.7* 8.5*  MG 2.5*  --  2.0   GFR Estimated Creatinine Clearance: 25.3 mL/min (A) (by C-G formula based on SCr of 1.16 mg/dL (H)). Liver Function Tests: Recent Labs  Lab 11/12/22 1415 11/13/22 0431  AST 46* 28  ALT 42 30  ALKPHOS 81 66  BILITOT 0.6 0.5  PROT 7.2 6.1*  ALBUMIN 3.5 3.0*   No results for input(s): "LIPASE", "AMYLASE" in the last 168 hours. No results for input(s): "AMMONIA" in the last 168 hours. Coagulation profile No results for input(s): "INR", "PROTIME" in the last 168 hours.  CBC: Recent Labs  Lab 11/12/22 1415 11/13/22 0431  WBC 11.9* 10.3  NEUTROABS 9.3*  --   HGB 10.6* 9.5*  HCT 33.7* 30.0*  MCV 92.8 91.2  PLT 377 326   Cardiac Enzymes: No results for input(s): "CKTOTAL", "CKMB", "CKMBINDEX", "TROPONINI" in the last 168 hours. BNP (last 3 results) No results for input(s): "PROBNP" in the last 8760 hours. CBG: No results for input(s): "GLUCAP" in the last 168 hours. D-Dimer: No results for input(s): "DDIMER" in the last 72 hours. Hgb A1c: No results for input(s): "HGBA1C" in the last 72 hours. Lipid Profile: No results for input(s): "CHOL", "HDL", "LDLCALC", "TRIG", "CHOLHDL", "LDLDIRECT" in the last 72 hours. Thyroid function studies: No results for input(s): "TSH", "T4TOTAL", "T3FREE", "THYROIDAB" in the last 72 hours.  Invalid input(s): "FREET3" Anemia work up: No results for input(s): "VITAMINB12", "FOLATE", "FERRITIN", "TIBC", "IRON", "RETICCTPCT" in the last 72 hours. Sepsis Labs: Recent Labs  Lab 11/12/22 1415 11/13/22 0431  WBC 11.9* 10.3  LATICACIDVEN 1.6  --     Microbiology Recent Results (from the past 240 hour(s))  Resp panel by RT-PCR (RSV, Flu A&B, Covid) Anterior Nasal Swab     Status: None   Collection Time: 11/12/22  2:15 PM   Specimen: Anterior Nasal Swab   Result Value Ref Range Status   SARS Coronavirus 2 by RT PCR NEGATIVE NEGATIVE Final    Comment: (NOTE) SARS-CoV-2 target nucleic acids are NOT DETECTED.  The SARS-CoV-2 RNA is generally detectable in upper respiratory specimens during the acute phase of infection. The lowest concentration of SARS-CoV-2 viral copies this assay can detect is 138 copies/mL. A negative result does not preclude SARS-Cov-2 infection and should not be used as the sole basis for treatment or other patient management decisions. A negative result may occur with  improper specimen collection/handling, submission of specimen other than nasopharyngeal swab, presence of  viral mutation(s) within the areas targeted by this assay, and inadequate number of viral copies(<138 copies/mL). A negative result must be combined with clinical observations, patient history, and epidemiological information. The expected result is Negative.  Fact Sheet for Patients:  EntrepreneurPulse.com.au  Fact Sheet for Healthcare Providers:  IncredibleEmployment.be  This test is no t yet approved or cleared by the [place] FDA and  has been authorized for detection and/or diagnosis of SARS-CoV-2 by FDA under an Emergency Use Authorization (EUA). This EUA will remain  in effect (meaning this test can be used) for the duration of the COVID-19 declaration under Section 564(b)(1) of the Act, 21 U.S.C.section 360bbb-3(b)(1), unless the authorization is terminated  or revoked sooner.       Influenza A by PCR NEGATIVE NEGATIVE Final   Influenza B by PCR NEGATIVE NEGATIVE Final    Comment: (NOTE) The Xpert Xpress SARS-CoV-2/FLU/RSV plus assay is intended as an aid in the diagnosis of influenza from Nasopharyngeal swab specimens and should not be used as a sole basis for treatment. Nasal washings and aspirates are unacceptable for Xpert Xpress SARS-CoV-2/FLU/RSV testing.  Fact Sheet for  Patients: EntrepreneurPulse.com.au  Fact Sheet for Healthcare Providers: IncredibleEmployment.be  This test is not yet approved or cleared by the [place] FDA and has been authorized for detection and/or diagnosis of SARS-CoV-2 by FDA under an Emergency Use Authorization (EUA). This EUA will remain in effect (meaning this test can be used) for the duration of the COVID-19 declaration under Section 564(b)(1) of the Act, 21 U.S.C. section 360bbb-3(b)(1), unless the authorization is terminated or revoked.     Resp Syncytial Virus by PCR NEGATIVE NEGATIVE Final    Comment: (NOTE) Fact Sheet for Patients: EntrepreneurPulse.com.au  Fact Sheet for Healthcare Providers: IncredibleEmployment.be  This test is not yet approved or cleared by the [place] FDA and has been authorized for detection and/or diagnosis of SARS-CoV-2 by FDA under an Emergency Use Authorization (EUA). This EUA will remain in effect (meaning this test can be used) for the duration of the COVID-19 declaration under Section 564(b)(1) of the Act, 21 U.S.C. section 360bbb-3(b)(1), unless the authorization is terminated or revoked.  Performed at [facility], [address], [place], [place] [address]     Procedures and diagnostic studies:  DG Chest Portable 1 View  Result Date: 11/12/2022 CLINICAL DATA:  Suspected PNE. EXAM: PORTABLE CHEST 1 VIEW COMPARISON:  Chest radiograph 11/04/2022 FINDINGS: The cardiomediastinal silhouette is unchanged. A new leadless pacemaker has been placed. There is persistent elevation of the right hemidiaphragm. Pulmonary vascular congestion and diffuse interstitial opacities are similar to the prior study. No sizable pleural effusion or pneumothorax is identified. No acute osseous abnormality is seen. IMPRESSION: Unchanged bilateral interstitial densities which could reflect edema superimposed on  chronic lung disease. Electronically Signed   By: Logan Bores M.D.   On: 11/12/2022 14:38               LOS: 1 day   Desmund Elman  [facility]   Pager on [email or web address]. If 7PM-7AM, please contact night-coverage at [email or web address]     11/14/2022, 12:00 PM

## 2022-11-14 NOTE — Progress Notes (Signed)
SATURATION QUALIFICATIONS: (This note is used to comply with regulatory documentation for home oxygen)  Patient Saturations on Room Air at Rest = 97%  Patient Saturations on Room Air while Ambulating = 92%

## 2022-11-15 DIAGNOSIS — J9601 Acute respiratory failure with hypoxia: Secondary | ICD-10-CM | POA: Diagnosis not present

## 2022-11-15 DIAGNOSIS — I480 Paroxysmal atrial fibrillation: Secondary | ICD-10-CM | POA: Diagnosis not present

## 2022-11-15 DIAGNOSIS — I5033 Acute on chronic diastolic (congestive) heart failure: Secondary | ICD-10-CM | POA: Diagnosis not present

## 2022-11-15 LAB — BASIC METABOLIC PANEL
Anion gap: 9 (ref 5–15)
BUN: 28 mg/dL — ABNORMAL HIGH (ref 8–23)
CO2: 24 mmol/L (ref 22–32)
Calcium: 8.6 mg/dL — ABNORMAL LOW (ref 8.9–10.3)
Chloride: 102 mmol/L (ref 98–111)
Creatinine, Ser: 1.14 mg/dL — ABNORMAL HIGH (ref 0.44–1.00)
GFR, Estimated: 46 mL/min — ABNORMAL LOW (ref >60)
Glucose, Bld: 127 mg/dL — ABNORMAL HIGH (ref 70–99)
Potassium: 3.4 mmol/L — ABNORMAL LOW (ref 3.5–5.1)
Sodium: 135 mmol/L (ref 135–145)

## 2022-11-15 LAB — MAGNESIUM: Magnesium: 2.1 mg/dL (ref 1.7–2.4)

## 2022-11-15 MED ORDER — FUROSEMIDE 40 MG PO TABS: 40.0000 mg | ORAL_TABLET | Freq: Every day | ORAL | 0 refills | Status: DC

## 2022-11-15 MED ORDER — FUROSEMIDE 40 MG PO TABS
40.0000 mg | ORAL_TABLET | Freq: Every day | ORAL | Status: DC
  Administered 2022-11-15: 40 mg via ORAL
  Filled 2022-11-15: qty 1

## 2022-11-15 MED ORDER — POTASSIUM CHLORIDE CRYS ER 20 MEQ PO TBCR: 40.0000 meq | EXTENDED_RELEASE_TABLET | Freq: Once | ORAL | Status: DC

## 2022-11-15 NOTE — Discharge Summary (Signed)
Physician Discharge Summary   Patient: Anne Mitchell MRN: [id number] DOB: [birth year]  Admit date:     11/12/2022  Discharge date: 11/15/22  Discharge Physician: Jennye Boroughs   PCP: Valera Castle, MD   Recommendations at discharge:   Follow-up with PCP in 1 to 2 weeks Follow-up with cardiologist in 1 to 2 weeks  Discharge Diagnoses: Principal Problem:   Acute on chronic diastolic (congestive) heart failure (HCC) Active Problems:   Coronary artery disease   Bradyarrhythmia   Essential hypertension   AF (paroxysmal atrial fibrillation) (HCC)   S/P placement of cardiac pacemaker   Acute respiratory failure with hypoxia (HCC)  Resolved Problems:   * No resolved hospital problems. [facility] Course:  Anne Mitchell is a 89 y.o. 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  She presented to the hospital with shortness of breath and oxygen saturation of 80% on room air with ambulation.     She was admitted to the hospital for acute on chronic diastolic CHF and acute hypoxic respiratory failure.  She was treated with IV Lasix.  She was placed on 2 L/min oxygen via [place].  She was successfully weaned off of oxygen and she is tolerating room air.  Her condition has improved significantly.  She feels better and wants to go home today.  Discharge plan was discussed with Ishmael Holter, daughter, over the phone.        Consultants: None Procedures performed: None  Disposition: Home health Diet recommendation:  Discharge Diet Orders (From admission, onward)     Start     Ordered   11/15/22 0000  Diet - low sodium heart healthy         11/15/22 0915           Cardiac diet DISCHARGE MEDICATION: Allergies as of 11/15/2022       Reactions   Other Rash, Swelling   "mycin" meds Gin Gin alcohol. Swelling of the genital area   Penicillin G Itching   Banana Swelling   Penicillins Swelling   Sulfa Antibiotics         Medication List     TAKE these medications    albuterol 108 (90 Base) MCG/ACT inhaler Commonly known as: VENTOLIN HFA Inhale 2 puffs into the lungs every 6 (six) hours as needed for wheezing or shortness of breath.   amiodarone 200 MG tablet Commonly known as: PACERONE Take 1 tablet (200 mg total) by mouth 2 (two) times daily for 9 days.   amiodarone 200 MG tablet Commonly known as: PACERONE Take 1 tablet (200 mg total) by mouth daily. Start taking on: November 16, 2022   atorvastatin 80 MG tablet Commonly known as: LIPITOR Take 1 tablet (80 mg total) by mouth daily.   CALCIUM 500 + D PO Take 1 tablet by mouth as directed. Take 1 tablet three times a week on Sunday, Wednesday and Friday mornings.   Cholecalciferol 50 MCG (2000 UT) Caps Take 2,000 Units by mouth daily.   Eliquis 2.5 MG Tabs tablet Generic drug: apixaban Take 1 tablet (2.5 mg total) by mouth 2 (two) times daily.   feeding supplement Liqd Take 237 mLs by mouth 2 (two) times daily between meals.   furosemide 40 MG tablet Commonly known as: LASIX Take  1 tablet (40 mg total) by mouth daily. Start taking on: November 16, 2022   metoprolol tartrate 25 MG tablet Commonly known as: LOPRESSOR Take 0.5 tablets (12.5 mg total) by mouth 2 (two) times daily.   sertraline 50 MG tablet Commonly known as: ZOLOFT Take 50 mg by mouth daily.   Vitrum Senior Tabs Take by mouth.        Discharge Exam: Filed Weights   11/12/22 1354 11/12/22 1846 11/14/22 0741  Weight: 54.2 kg 54.4 kg 52 kg   GEN: NAD SKIN: Warm and dry EYES: No pallor or icterus ENT: MMM CV: RRR PULM: No wheezing or rales heard ABD: soft, ND, NT,  +BS CNS: AAO x 3, non focal EXT: No edema or tenderness   Condition at discharge: good  The results of significant diagnostics from this hospitalization (including imaging, microbiology, ancillary and laboratory) are listed below for reference.   Imaging Studies: DG Chest Portable 1 View  Result Date: 11/12/2022 CLINICAL DATA:  Suspected PNE. EXAM: PORTABLE CHEST 1 VIEW COMPARISON:  Chest radiograph 11/04/2022 FINDINGS: The cardiomediastinal silhouette is unchanged. A new leadless pacemaker has been placed. There is persistent elevation of the right hemidiaphragm. Pulmonary vascular congestion and diffuse interstitial opacities are similar to the prior study. No sizable pleural effusion or pneumothorax is identified. No acute osseous abnormality is seen. IMPRESSION: Unchanged bilateral interstitial densities which could reflect edema superimposed on chronic lung disease. Electronically Signed   By: Logan Bores M.D.   On: 11/12/2022 14:38   EP PPM/ICD IMPLANT  Result Date: 11/05/2022 Successful Micra AV 2 leadless pacemaker implantation   DG Chest Port 1 View  Result Date: 11/04/2022 CLINICAL DATA:  89 year old female with tachypnea. Planned pacemaker placement. EXAM: PORTABLE CHEST 1 VIEW COMPARISON:  CT Chest, Abdomen, and Pelvis 11/01/2022, and earlier. FINDINGS: Portable AP upright view at 0329 hours. Pacer or resuscitation pads project over the chest and upper abdomen. Stable lung volumes and mediastinal contours. Coarse bilateral pulmonary interstitial opacity corresponding to recent CT lung findings. No pneumothorax, pleural effusion or consolidation. But pulmonary vascularity remains increased compared to portable chest 10/31/2022. Stable visualized osseous structures.  Negative visible bowel gas. IMPRESSION: 1. Possible pulmonary interstitial edema superimposed on chronic lung disease. 2. No pleural effusion or other acute cardiopulmonary abnormality. Electronically Signed   By: Genevie Ann  M.D.   On: 11/04/2022 04:04   CT CHEST ABDOMEN PELVIS WO CONTRAST  Result Date: 11/01/2022 CLINICAL DATA:  Hypotension.  Evaluate for possible aneurysm EXAM: CT CHEST, ABDOMEN AND PELVIS WITHOUT CONTRAST TECHNIQUE: Multidetector CT imaging of the chest, abdomen and pelvis was performed following the standard protocol without IV contrast. RADIATION DOSE REDUCTION: This exam was performed according to the departmental dose-optimization program which includes automated exposure control, adjustment of the mA and/or kV according to patient size and/or use of iterative reconstruction technique. COMPARISON:  Chest radiograph dated 11/01/2022. FINDINGS: Evaluation of this exam is limited in the absence of intravenous contrast. CT CHEST FINDINGS Cardiovascular: There is no cardiomegaly or pericardial effusion. There is advanced 3 vessel coronary vascular calcification. Moderate atherosclerotic calcification of the thoracic aorta. No aneurysmal dilatation. The central pulmonary arteries are grossly unremarkable on this noncontrast CT. Mediastinum/Nodes: No hilar or mediastinal adenopathy. The esophagus is grossly unremarkable. No mediastinal fluid collection. Lungs/Pleura: Scattered pulmonary nodules measure up to 5 mm in the left upper lobe. Findings may be related to underlying granulomatous disease such as sarcoid or other inflammatory processes. Clinical correlation is recommended. There is a focal area  of pleural thickening with associated reticulation in the anterior right middle lobe, likely scarring. There is trace bilateral pleural effusions. No focal consolidation or pneumothorax. The central airways are patent. Musculoskeletal: Osteopenia with degenerative changes of the spine. No acute osseous pathology. CT ABDOMEN PELVIS FINDINGS No intra-abdominal free air or free fluid. Hepatobiliary: The liver is unremarkable. No biliary dilatation. The gallbladder is unremarkable. Pancreas: Mild haziness of the fat  adjacent to the head and uncinate process of the pancreas. Correlation with pancreatic enzymes recommended to exclude pancreatitis. No fluid collection or abscess. Spleen: Normal in size without focal abnormality. Adrenals/Urinary Tract: The adrenal glands are unremarkable. Mild bilateral renal parenchyma atrophy. There is no hydronephrosis or nephrolithiasis on either side. There is a 5 cm right renal inferior pole cyst. The visualized ureters and urinary bladder appear unremarkable. Stomach/Bowel: There is severe sigmoid diverticulosis without active inflammatory changes. There is no bowel obstruction or active inflammation. The appendix is not visualized with certainty. No inflammatory changes identified in the right lower quadrant. Vascular/Lymphatic: Advanced aortoiliac atherosclerotic disease. No aneurysmal dilatation. The IVC is unremarkable. No portal venous gas. There is no adenopathy. Reproductive: Hysterectomy. The right ovary is not identified with certainty. Left ovarian cyst measure up to 2.5 cm. No follow-up imaging recommended. Note: This recommendation does not apply to premenarchal patients and to those with increased risk (genetic, family history, elevated tumor markers or other high-risk factors) of ovarian cancer. Reference: JACR 2020 Feb; 17(2):248-254 Other: None Musculoskeletal: Osteopenia with multilevel degenerative changes of the spine. No acute osseous pathology. IMPRESSION: 1. No aortic aneurysm. 2. Trace bilateral pleural effusions. 3. Scattered pulmonary nodules measure up to 5 mm in the left upper lobe. Findings may be related to underlying granulomatous disease such as sarcoid or other inflammatory processes. Follow-up with chest CT in 6 months recommended. 4. Mild haziness of the fat adjacent to the head and uncinate process of the pancreas. Correlation with pancreatic enzymes recommended to exclude pancreatitis. No fluid collection or abscess. 5. Severe sigmoid diverticulosis  without active inflammatory changes. No bowel obstruction. 6.  Aortic Atherosclerosis (ICD10-I70.0). Electronically Signed   By: Anner Crete M.D.   On: 11/01/2022 22:22   DG Chest Port 1 View  Result Date: 11/01/2022 CLINICAL DATA:  Dyspnea EXAM: PORTABLE CHEST 1 VIEW COMPARISON:  10/31/2022 FINDINGS: Cardiac shadow is prominent but stable from the prior exam. Diffuse interstitial changes are again identified although increased central vascular congestion is now noted. No edema is seen. No bony abnormality is noted. IMPRESSION: New superimposed vascular congestion is now seen overlying the previously seen chronic interstitial changes. Electronically Signed   By: Inez Catalina M.D.   On: 11/01/2022 18:46   [place] EKG SITE RITE  Result Date: 11/01/2022 If Site Rite image not attached, placement could not be confirmed due to current cardiac rhythm.  ECHOCARDIOGRAM COMPLETE  Result Date: 11/01/2022    ECHOCARDIOGRAM REPORT   Patient Name:   KHALA ARAVE Date of Exam: 11/01/2022 Medical Rec #:  [id number]      Height:       61.0 in Accession #:    [id number]     Weight:       114.2 lb Date of Birth:  09-21-1931      BSA:          1.488 m Patient Age:    89 years       BP:           99/45 mmHg Patient Gender: F  HR:           139 bpm. Exam Location:  [facility] Procedure: 2D Echo, Color Doppler, Cardiac Doppler and Intracardiac            Opacification Agent Indications:     Chest Pain R07.9  History:         Patient has prior history of Echocardiogram examinations. DHF,                  Emphysema; Risk Factors:Hypertension.  Sonographer:     L. Thornton-Maynard Referring Phys:  [id number] TINA LAI Diagnosing Phys: Yolonda Kida MD IMPRESSIONS  1. AFIB during study.  2. Left ventricular ejection fraction, by estimation, is 55 to 60%. The left ventricle has normal function. The left ventricle has no regional wall motion abnormalities. Left ventricular diastolic parameters are consistent with Grade I  diastolic dysfunction (impaired relaxation).  3. Right ventricular systolic function is normal. The right ventricular size is normal. There is normal pulmonary artery systolic pressure.  4. The mitral valve is normal in structure. Trivial mitral valve regurgitation.  5. The aortic valve is normal in structure. Aortic valve regurgitation is not visualized. FINDINGS  Left Ventricle: Left ventricular ejection fraction, by estimation, is 55 to 60%. The left ventricle has normal function. The left ventricle has no regional wall motion abnormalities. Definity contrast agent was given IV to delineate the left ventricular  endocardial borders. The left ventricular internal cavity size was normal in size. There is borderline concentric left ventricular hypertrophy. Left ventricular diastolic parameters are consistent with Grade I diastolic dysfunction (impaired relaxation). Right Ventricle: The right ventricular size is normal. No increase in right ventricular wall thickness. Right ventricular systolic function is normal. There is normal pulmonary artery systolic pressure. The tricuspid regurgitant velocity is 2.44 m/s, and  with an assumed right atrial pressure of 3 mmHg, the estimated right ventricular systolic pressure is 0000000 mmHg. Left Atrium: Left atrial size was normal in size. Right Atrium: Right atrial size was normal in size. Pericardium: There is no evidence of pericardial effusion. Mitral Valve: The mitral valve is normal in structure. Trivial mitral valve regurgitation. Tricuspid Valve: The tricuspid valve is normal in structure. Tricuspid valve regurgitation is trivial. Aortic Valve: The aortic valve is normal in structure. Aortic valve regurgitation is not visualized. Aortic valve mean gradient measures 4.0 mmHg. Aortic valve peak gradient measures 6.2 mmHg. Aortic valve area, by VTI measures 1.77 cm. Pulmonic Valve: The pulmonic valve was normal in structure. Pulmonic valve regurgitation is not visualized.  Aorta: The ascending aorta was not well visualized. IAS/Shunts: No atrial level shunt detected by color flow Doppler. Additional Comments: AFIB during study.  LEFT VENTRICLE PLAX 2D LVIDd:         3.20 cm     Diastology LVIDs:         2.30 cm     LV e' medial:    4.68 cm/s LV PW:         1.00 cm     LV E/e' medial:  14.4 LV IVS:        1.60 cm     LV e' lateral:   5.22 cm/s LVOT diam:     1.70 cm     LV E/e' lateral: 13.0 LV SV:         28 LV SV Index:   19 LVOT Area:     2.27 cm  LV Volumes (MOD) LV vol d, MOD A2C: 21.2 ml LV vol d,  MOD A4C: 32.3 ml LV vol s, MOD A2C: 9.6 ml LV vol s, MOD A4C: 9.6 ml LV SV MOD A2C:     11.6 ml LV SV MOD A4C:     32.3 ml LV SV MOD BP:      16.2 ml RIGHT VENTRICLE RV S prime:     9.03 cm/s LEFT ATRIUM             Index        RIGHT ATRIUM          Index LA diam:        3.70 cm 2.49 cm/m   RA Area:     8.51 cm LA Vol (A2C):   32.0 ml 21.50 ml/m  RA Volume:   13.30 ml 8.94 ml/m LA Vol (A4C):   36.6 ml 24.59 ml/m LA Biplane Vol: 34.5 ml 23.18 ml/m  AORTIC VALVE                    PULMONIC VALVE AV Area (Vmax):    1.87 cm     PV Vmax:       1.05 m/s AV Area (Vmean):   1.75 cm     PV Peak grad:  4.4 mmHg AV Area (VTI):     1.77 cm AV Vmax:           124.00 cm/s AV Vmean:          97.050 cm/s AV VTI:            0.160 m AV Peak Grad:      6.2 mmHg AV Mean Grad:      4.0 mmHg LVOT Vmax:         102.20 cm/s LVOT Vmean:        74.750 cm/s LVOT VTI:          0.125 m LVOT/AV VTI ratio: 0.78  AORTA Ao Root diam: 2.90 cm Ao Asc diam:  3.50 cm MITRAL VALVE               TRICUSPID VALVE MV Area (PHT): 11.49 cm   TR Peak grad:   23.8 mmHg MV Decel Time: 66 msec     TR Vmax:        244.00 cm/s MV E velocity: 67.60 cm/s                            SHUNTS                            Systemic VTI:  0.12 m                            Systemic Diam: 1.70 cm Yolonda Kida MD Electronically signed by Yolonda Kida MD Signature Date/Time: 11/01/2022/2:30:23 PM    Final    DG Shoulder  Right  Result Date: 10/31/2022 CLINICAL DATA:  Right shoulder pain. EXAM: RIGHT SHOULDER - 2+ VIEW COMPARISON:  Chest radiograph dated 10/31/2022. FINDINGS: No acute fracture or dislocation the bones are osteopenic. Mild degenerative changes of the right shoulder. Rounded osseous density over the humeral neck may represent loose bodies. The soft tissues are unremarkable. IMPRESSION: 1. No acute fracture or dislocation. 2. Mild degenerative changes of the right shoulder. Electronically Signed   By: Anner Crete M.D.   On: 10/31/2022 02:22   DG Chest  2 View  Result Date: 10/31/2022 CLINICAL DATA:  Chest pain. EXAM: CHEST - 2 VIEW COMPARISON:  Chest radiograph dated 09/16/2022. FINDINGS: There is diffuse chronic interstitial coarsening. Mild elevation of the right hemidiaphragm. No focal consolidation, pleural effusion or pneumothorax. The cardiac silhouette is within limits. Osteopenia with degenerative changes of the spine. No acute osseous pathology. IMPRESSION: 1. No acute cardiopulmonary process. 2. Chronic interstitial coarsening. Electronically Signed   By: Anner Crete M.D.   On: 10/31/2022 02:21    Microbiology: Results for orders placed or performed during the hospital encounter of 11/12/22  Resp panel by RT-PCR (RSV, Flu A&B, Covid) Anterior Nasal Swab     Status: None   Collection Time: 11/12/22  2:15 PM   Specimen: Anterior Nasal Swab  Result Value Ref Range Status   SARS Coronavirus 2 by RT PCR NEGATIVE NEGATIVE Final    Comment: (NOTE) SARS-CoV-2 target nucleic acids are NOT DETECTED.  The SARS-CoV-2 RNA is generally detectable in upper respiratory specimens during the acute phase of infection. The lowest concentration of SARS-CoV-2 viral copies this assay can detect is 138 copies/mL. A negative result does not preclude SARS-Cov-2 infection and should not be used as the sole basis for treatment or other patient management decisions. A negative result may occur with   improper specimen collection/handling, submission of specimen other than nasopharyngeal swab, presence of viral mutation(s) within the areas targeted by this assay, and inadequate number of viral copies(<138 copies/mL). A negative result must be combined with clinical observations, patient history, and epidemiological information. The expected result is Negative.  Fact Sheet for Patients:  EntrepreneurPulse.com.au  Fact Sheet for Healthcare Providers:  IncredibleEmployment.be  This test is no t yet approved or cleared by the [place] FDA and  has been authorized for detection and/or diagnosis of SARS-CoV-2 by FDA under an Emergency Use Authorization (EUA). This EUA will remain  in effect (meaning this test can be used) for the duration of the COVID-19 declaration under Section 564(b)(1) of the Act, 21 U.S.C.section 360bbb-3(b)(1), unless the authorization is terminated  or revoked sooner.       Influenza A by PCR NEGATIVE NEGATIVE Final   Influenza B by PCR NEGATIVE NEGATIVE Final    Comment: (NOTE) The Xpert Xpress SARS-CoV-2/FLU/RSV plus assay is intended as an aid in the diagnosis of influenza from Nasopharyngeal swab specimens and should not be used as a sole basis for treatment. Nasal washings and aspirates are unacceptable for Xpert Xpress SARS-CoV-2/FLU/RSV testing.  Fact Sheet for Patients: EntrepreneurPulse.com.au  Fact Sheet for Healthcare Providers: IncredibleEmployment.be  This test is not yet approved or cleared by the [place] FDA and has been authorized for detection and/or diagnosis of SARS-CoV-2 by FDA under an Emergency Use Authorization (EUA). This EUA will remain in effect (meaning this test can be used) for the duration of the COVID-19 declaration under Section 564(b)(1) of the Act, 21 U.S.C. section 360bbb-3(b)(1), unless the authorization is terminated or revoked.      Resp Syncytial Virus by PCR NEGATIVE NEGATIVE Final    Comment: (NOTE) Fact Sheet for Patients: EntrepreneurPulse.com.au  Fact Sheet for Healthcare Providers: IncredibleEmployment.be  This test is not yet approved or cleared by the [place] FDA and has been authorized for detection and/or diagnosis of SARS-CoV-2 by FDA under an Emergency Use Authorization (EUA). This EUA will remain in effect (meaning this test can be used) for the duration of the COVID-19 declaration under Section 564(b)(1) of the Act, 21 U.S.C. section 360bbb-3(b)(1), unless the  authorization is terminated or revoked.  Performed at [facility], [address] [place], [place] [address]     Labs: CBC: Recent Labs  Lab 11/12/22 1415 11/13/22 0431  WBC 11.9* 10.3  NEUTROABS 9.3*  --   HGB 10.6* 9.5*  HCT 33.7* 30.0*  MCV 92.8 91.2  PLT 377 [id number]   Basic Metabolic Panel: Recent Labs  Lab 11/12/22 1415 11/13/22 0431 11/14/22 0751 11/15/22 0816  NA 139 140 134* 135  K 4.7 4.2 3.7 3.4*  CL 104 105 102 102  CO2 '25 26 22 24  '$ GLUCOSE 98 95 98 127*  BUN 22 23 26* 28*  CREATININE 1.21* 1.34* 1.16* 1.14*  CALCIUM 9.5 8.7* 8.5* 8.6*  MG 2.5*  --  2.0  --    Liver Function Tests: Recent Labs  Lab 11/12/22 1415 11/13/22 0431  AST 46* 28  ALT 42 30  ALKPHOS 81 66  BILITOT 0.6 0.5  PROT 7.2 6.1*  ALBUMIN 3.5 3.0*   CBG: No results for input(s): "GLUCAP" in the last 168 hours.  Discharge time spent: greater than 30 minutes.  Signed: Jennye Boroughs, MD [facility] 11/15/2022

## 2022-11-18 ENCOUNTER — Other Ambulatory Visit: Payer: Self-pay

## 2022-11-18 ENCOUNTER — Emergency Department: Payer: Medicare Other

## 2022-11-18 ENCOUNTER — Emergency Department
Admission: EM | Admit: 2022-11-18 | Discharge: 2022-11-18 | Disposition: A | Discharge status: Home / Self Care | Payer: Medicare Other | Attending: Student in an Organized Health Care Education/Training Program | Admitting: Student in an Organized Health Care Education/Training Program

## 2022-11-18 ENCOUNTER — Encounter: Payer: Self-pay | Admitting: Emergency Medicine

## 2022-11-18 DIAGNOSIS — R790 Abnormal level of blood mineral: Secondary | ICD-10-CM | POA: Insufficient documentation

## 2022-11-18 DIAGNOSIS — I509 Heart failure, unspecified: Secondary | ICD-10-CM | POA: Insufficient documentation

## 2022-11-18 DIAGNOSIS — R0602 Shortness of breath: Secondary | ICD-10-CM | POA: Diagnosis present

## 2022-11-18 DIAGNOSIS — R0600 Dyspnea, unspecified: Secondary | ICD-10-CM

## 2022-11-18 LAB — URINALYSIS, ROUTINE W REFLEX MICROSCOPIC
Bacteria, UA: NONE SEEN
Bilirubin Urine: NEGATIVE
Glucose, UA: NEGATIVE mg/dL
Hgb urine dipstick: NEGATIVE
Ketones, ur: NEGATIVE mg/dL
Nitrite: NEGATIVE
Protein, ur: NEGATIVE mg/dL
Specific Gravity, Urine: 1.01 (ref 1.005–1.030)
pH: 6 (ref 5.0–8.0)

## 2022-11-18 LAB — TROPONIN I (HIGH SENSITIVITY)
Troponin I (High Sensitivity): 11 ng/L (ref <18)
Troponin I (High Sensitivity): 11 ng/L (ref <18)

## 2022-11-18 LAB — COMPREHENSIVE METABOLIC PANEL
ALT: 28 U/L (ref 0–44)
AST: 37 U/L (ref 15–41)
Albumin: 3.4 g/dL — ABNORMAL LOW (ref 3.5–5.0)
Alkaline Phosphatase: 71 U/L (ref 38–126)
Anion gap: 8 (ref 5–15)
BUN: 30 mg/dL — ABNORMAL HIGH (ref 8–23)
CO2: 24 mmol/L (ref 22–32)
Calcium: 8.8 mg/dL — ABNORMAL LOW (ref 8.9–10.3)
Chloride: 103 mmol/L (ref 98–111)
Creatinine, Ser: 1.32 mg/dL — ABNORMAL HIGH (ref 0.44–1.00)
GFR, Estimated: 39 mL/min — ABNORMAL LOW (ref >60)
Glucose, Bld: 115 mg/dL — ABNORMAL HIGH (ref 70–99)
Potassium: 4.4 mmol/L (ref 3.5–5.1)
Sodium: 135 mmol/L (ref 135–145)
Total Bilirubin: 0.6 mg/dL (ref 0.3–1.2)
Total Protein: 6.6 g/dL (ref 6.5–8.1)

## 2022-11-18 LAB — CBC WITH DIFFERENTIAL/PLATELET
Abs Immature Granulocytes: 0.03 10*3/uL (ref 0.00–0.07)
Basophils Absolute: 0.1 10*3/uL (ref 0.0–0.1)
Basophils Relative: 1 %
Eosinophils Absolute: 0.2 10*3/uL (ref 0.0–0.5)
Eosinophils Relative: 2 %
HCT: 33.1 % — ABNORMAL LOW (ref 36.0–46.0)
Hemoglobin: 10.2 g/dL — ABNORMAL LOW (ref 12.0–15.0)
Immature Granulocytes: 0 %
Lymphocytes Relative: 15 %
Lymphs Abs: 1.5 10*3/uL (ref 0.7–4.0)
MCH: 28.8 pg (ref 26.0–34.0)
MCHC: 30.8 g/dL (ref 30.0–36.0)
MCV: 93.5 fL (ref 80.0–100.0)
Monocytes Absolute: 0.8 10*3/uL (ref 0.1–1.0)
Monocytes Relative: 8 %
Neutro Abs: 7.5 10*3/uL (ref 1.7–7.7)
Neutrophils Relative %: 74 %
Platelets: 312 10*3/uL (ref 150–400)
RBC: 3.54 MIL/uL — ABNORMAL LOW (ref 3.87–5.11)
RDW: 16 % — ABNORMAL HIGH (ref 11.5–15.5)
WBC: 10 10*3/uL (ref 4.0–10.5)
nRBC: 0 % (ref 0.0–0.2)

## 2022-11-18 LAB — BRAIN NATRIURETIC PEPTIDE: B Natriuretic Peptide: 1414 pg/mL — ABNORMAL HIGH (ref 0.0–100.0)

## 2022-11-18 MED ORDER — FUROSEMIDE 10 MG/ML IJ SOLN
40.0000 mg | Freq: Once | INTRAMUSCULAR | Status: AC
  Administered 2022-11-18: 40 mg via INTRAVENOUS
  Filled 2022-11-18: qty 4

## 2022-11-18 NOTE — Discharge Instructions (Addendum)

## 2022-11-18 NOTE — ED Provider Notes (Signed)
[facility] Provider Note    Event Date/Time   First MD Initiated Contact with Patient 11/18/22 [phone]     (approximate)   History   Shortness of Breath   HPI  Anne Mitchell is a 89 y.o. female recent hospitalization for acute on chronic CHF with acute respiratory failure with hypoxia discharged without home oxygen as she improved with diuresis presents to the ER today due to worsening exertional dyspnea.  Feeling very fatigued.  They are currently moving and she is having trouble completing any activities that she has to sit down after only a few steps.  She has been compliant with her medications.  She denies any chest pain or pressure.Marland Kitchen      Physical Exam   Triage Vital Signs: ED Triage Vitals  Enc Vitals Group     BP 11/18/22 0700 137/64     Pulse Rate 11/18/22 0700 (!) 46     Resp 11/18/22 0700 (!) 22     Temp --      Temp src --      SpO2 11/18/22 0700 97 %     Weight 11/18/22 0654 114 lb 10.2 oz (52 kg)     Height 11/18/22 0654 '5\' 1"'$  (1.549 m)     Head Circumference --      Peak Flow --      Pain Score 11/18/22 0652 2     Pain Loc --      Pain Edu? --      Excl. in [place]? --     Most recent vital signs: Vitals:   11/18/22 1130 11/18/22 1215  BP: 126/76 122/75  Pulse: (!) 45 (!) 59  Resp: 18 14  Temp:  (!) 97.3 F (36.3 C)  SpO2: 100% 97%     Constitutional: Alert  Eyes: Conjunctivae are normal.  Head: Atraumatic. Nose: No congestion/rhinnorhea. Mouth/Throat: Mucous membranes are moist.   Neck: Painless ROM.  Cardiovascular:   Good peripheral circulation. Respiratory: Normal respiratory effort.  Diminished bibasilar breath sounds.  Scattered crackles.  No wheeze. Gastrointestinal: Soft and nontender.  Musculoskeletal:  no deformity Neurologic:  MAE spontaneously. No gross focal neurologic deficits are appreciated.  Skin:  Skin is warm, dry and intact. No rash noted. Psychiatric: Mood and affect are normal. Speech and behavior  are normal.    ED Results / Procedures / Treatments   Labs (all labs ordered are listed, but only abnormal results are displayed) Labs Reviewed  CBC WITH DIFFERENTIAL/PLATELET - Abnormal; Notable for the following components:      Result Value   RBC 3.54 (*)    Hemoglobin 10.2 (*)    HCT 33.1 (*)    RDW 16.0 (*)    All other components within normal limits  COMPREHENSIVE METABOLIC PANEL - Abnormal; Notable for the following components:   Glucose, Bld 115 (*)    BUN 30 (*)    Creatinine, Ser 1.32 (*)    Calcium 8.8 (*)    Albumin 3.4 (*)    GFR, Estimated 39 (*)    All other components within normal limits  BRAIN NATRIURETIC PEPTIDE - Abnormal; Notable for the following components:   B Natriuretic Peptide 1,414.0 (*)    All other components within normal limits  URINALYSIS, ROUTINE W REFLEX MICROSCOPIC - Abnormal; Notable for the following components:   Color, Urine YELLOW (*)    APPearance HAZY (*)    Leukocytes,Ua LARGE (*)    All other components within normal limits  TROPONIN I (HIGH SENSITIVITY)  TROPONIN I (HIGH SENSITIVITY)     EKG  ED ECG REPORT I, Merlyn Lot, the attending physician, personally viewed and interpreted this ECG.   Date: 11/18/2022  EKG Time: 6:58  Rate: 70  Rhythm: v paced  Axis: left  Intervals: borderline prolonged qt  ST&T Change:  paced rhythm,  abnl ekg    RADIOLOGY Please see ED Course for my review and interpretation.  I personally reviewed all radiographic images ordered to evaluate for the above acute complaints and reviewed radiology reports and findings.  These findings were personally discussed with the patient.  Please see medical record for radiology report.    PROCEDURES:  Critical Care performed:   Procedures   MEDICATIONS ORDERED IN ED: Medications  furosemide (LASIX) injection 40 mg (40 mg Intravenous Given 11/18/22 0940)     IMPRESSION / MDM / ASSESSMENT AND PLAN / ED COURSE  I reviewed the triage  vital signs and the nursing notes.                              Differential diagnosis includes, but is not limited to, Asthma, copd, CHF, pna, ptx, malignancy, Pe, anemia   Patient presenting to the ER for evaluation of symptoms as described above.  Based on symptoms, risk factors and considered above differential, this presenting complaint could reflect a potentially life-threatening illness therefore the patient will be placed on continuous pulse oximetry and telemetry for monitoring.  Laboratory evaluation will be sent to evaluate for the above complaints.      Clinical Course as of 11/18/22 1557  Wed Nov 18, 2022  0732 Chest x-ray on my review and interpretation without evidence of pneumothorax. [PR]  [id number] BNP is still mildly elevated compared to her baseline.  Will give dose of Lasix and reassess.  With ambulation she was not hypoxic but did become significantly dyspneic. [PR]  1054 Patient has started to diurese.  Remains satting well no acute distress.  No significant tachypnea.  No spoke with the patient's daughter who states that this morning had episode of will apparent anxiety as they were having quite a bit of stress with a plan to move this Saturday.  Thinks that she might have overdone it yesterday.  States that her breathing has been at baseline and that she did not feel like she needed to go to the ER this morning but the patient was concerned that the "pacemaker was falling out.  ". [PR]  1130 Urine without bacteria.  No white count.  She has diuresed remains satting well on room air.  Does appear stable and appropriate for outpatient follow-up. [PR]    Clinical Course User Index [PR] Merlyn Lot, MD      FINAL CLINICAL IMPRESSION(S) / ED DIAGNOSES   Final diagnoses:  Dyspnea, unspecified type  Acute on chronic congestive heart failure, unspecified heart failure type [facility])     Rx / DC Orders   ED Discharge Orders     None        Note:  This document  was prepared using Dragon voice recognition software and may include unintentional dictation errors.    Merlyn Lot, MD 11/18/22 [phone]

## 2022-11-18 NOTE — ED Triage Notes (Signed)
Pt to triage via w/c with no distress noted accomp by daughter who reports pt with possible anxiety; st awoke this morning with [facility] accomp by some lower abd pain with breathing; denies any recent illness, denies any cough; daughter st hosp over weekend for CHF and had pacemaker placed last wk

## 2022-11-18 NOTE — ED Notes (Signed)
RN attempted x2 to obtain IV access. Pt states we can only use left arm, as her right arm is restricted.

## 2022-11-18 NOTE — ED Notes (Signed)
RN ambulated pt with pulse oximetry. Pt O2 saturation remained 97-98% on room air, but pt became tachypneic in 30's. MD Quentin Cornwall notified.

## 2022-11-18 NOTE — ED Notes (Signed)
Called daughter, Ishmael Holter, to let her know her mom is ready for discharge. She said she was at work but would head this way ASAP. Told her I would get her mom ready so that when she got here she'd be ready to go.

## 2022-11-21 NOTE — Progress Notes (Deleted)
   Patient ID: Anne Mitchell, female    DOB: 03-May-1932, 89 y.o.   MRN: [id number]  HPI  Anne Mitchell is a 89 y/o female with a history of  Echo 11/01/22: EF 55-60% with normal PA pressure  LHC 03/22/20:  Prox RCA lesion is 60% stenosed. Mid RCA lesion is 30% stenosed. Ost Cx to Prox Cx lesion is 75% stenosed. Prox Cx lesion is 65% stenosed. Prox LAD lesion is 30% stenosed. Mid LAD lesion is 20% stenosed. Apical ballooning with ejection fraction of 25 to 30% 75% stenosis of ostial left circumflex artery 60% stenosis of right coronary artery Mild atherosclerosis of left anterior descending artery  Was in the ED 11/18/22 due to SOB & fatigue d/t HF exacerbation. IV lasix given. Admitted 11/12/22 due to a/c HF exacerbation. Diuresed with IV Lasix. Weaned off oxygen. Recent stoppage of meds due to hypotension. Admitted 10/31/22 due to chest pain w/ AF RVR. Converted to junctional bradycardia. Was in the ED 09/16/22 due to bronchitis.   She presents today for her initial visit with a chief complaint of   Review of Systems    Physical Exam  Assessment & Plan:  1: Chronic heart failure with preserved ejection fraction- - NYHA class - BNP 11/18/22 was 1414.0  2: HTN- - BP - had telemedicine visit with PCP (Anne Mitchell) 11/12/22 - BMP 11/18/22 showed sodium 135, potassium 4.4, creatinine 1.32 and GFR 39  3: Atrial fibrillation- - Micra AV 2 leadless pacemaker 11/05/22 - saw cardiology Anne Mitchell) 01/06/22

## 2022-11-23 ENCOUNTER — Telehealth: Payer: Self-pay | Admitting: Family

## 2022-11-23 ENCOUNTER — Encounter: Payer: Medicare Other | Admitting: Family

## 2022-11-23 NOTE — Telephone Encounter (Signed)
Patient did not show for her initial [facility] appointment on 11/23/22

## 2022-12-10 ENCOUNTER — Telehealth: Payer: Self-pay

## 2022-12-10 ENCOUNTER — Encounter: Payer: Self-pay | Admitting: *Deleted

## 2022-12-10 ENCOUNTER — Emergency Department: Payer: Medicare Other

## 2022-12-10 ENCOUNTER — Inpatient Hospital Stay
Admission: EM | Admit: 2022-12-10 | Discharge: 2022-12-15 | DRG: 291 | Disposition: A | Discharge status: Home Health | Payer: Medicare Other | Attending: Student in an Organized Health Care Education/Training Program | Admitting: Student in an Organized Health Care Education/Training Program

## 2022-12-10 ENCOUNTER — Other Ambulatory Visit: Payer: Self-pay

## 2022-12-10 DIAGNOSIS — N183 Chronic kidney disease, stage 3 unspecified: Secondary | ICD-10-CM | POA: Diagnosis present

## 2022-12-10 DIAGNOSIS — I509 Heart failure, unspecified: Secondary | ICD-10-CM

## 2022-12-10 DIAGNOSIS — J9621 Acute and chronic respiratory failure with hypoxia: Secondary | ICD-10-CM

## 2022-12-10 DIAGNOSIS — J441 Chronic obstructive pulmonary disease with (acute) exacerbation: Secondary | ICD-10-CM

## 2022-12-10 DIAGNOSIS — Z7901 Long term (current) use of anticoagulants: Secondary | ICD-10-CM

## 2022-12-10 DIAGNOSIS — E785 Hyperlipidemia, unspecified: Secondary | ICD-10-CM | POA: Diagnosis present

## 2022-12-10 DIAGNOSIS — I5033 Acute on chronic diastolic (congestive) heart failure: Secondary | ICD-10-CM | POA: Diagnosis present

## 2022-12-10 DIAGNOSIS — R0609 Other forms of dyspnea: Secondary | ICD-10-CM

## 2022-12-10 DIAGNOSIS — I495 Sick sinus syndrome: Secondary | ICD-10-CM | POA: Diagnosis present

## 2022-12-10 DIAGNOSIS — Z8249 Family history of ischemic heart disease and other diseases of the circulatory system: Secondary | ICD-10-CM

## 2022-12-10 DIAGNOSIS — I130 Hypertensive heart and chronic kidney disease with heart failure and stage 1 through stage 4 chronic kidney disease, or unspecified chronic kidney disease: Secondary | ICD-10-CM | POA: Diagnosis not present

## 2022-12-10 DIAGNOSIS — Z91018 Allergy to other foods: Secondary | ICD-10-CM

## 2022-12-10 DIAGNOSIS — Z880 Allergy status to penicillin: Secondary | ICD-10-CM

## 2022-12-10 DIAGNOSIS — J439 Emphysema, unspecified: Secondary | ICD-10-CM | POA: Diagnosis present

## 2022-12-10 DIAGNOSIS — J9601 Acute respiratory failure with hypoxia: Secondary | ICD-10-CM | POA: Diagnosis present

## 2022-12-10 DIAGNOSIS — I2510 Atherosclerotic heart disease of native coronary artery without angina pectoris: Secondary | ICD-10-CM | POA: Diagnosis present

## 2022-12-10 DIAGNOSIS — Z882 Allergy status to sulfonamides status: Secondary | ICD-10-CM

## 2022-12-10 DIAGNOSIS — Z950 Presence of cardiac pacemaker: Secondary | ICD-10-CM | POA: Diagnosis present

## 2022-12-10 DIAGNOSIS — I480 Paroxysmal atrial fibrillation: Secondary | ICD-10-CM | POA: Diagnosis present

## 2022-12-10 DIAGNOSIS — Z23 Encounter for immunization: Secondary | ICD-10-CM

## 2022-12-10 DIAGNOSIS — Z79899 Other long term (current) drug therapy: Secondary | ICD-10-CM

## 2022-12-10 DIAGNOSIS — I482 Chronic atrial fibrillation, unspecified: Secondary | ICD-10-CM | POA: Diagnosis present

## 2022-12-10 DIAGNOSIS — I10 Essential (primary) hypertension: Secondary | ICD-10-CM | POA: Diagnosis present

## 2022-12-10 DIAGNOSIS — Z888 Allergy status to other drugs, medicaments and biological substances status: Secondary | ICD-10-CM

## 2022-12-10 LAB — CBC
HCT: 37.2 % (ref 36.0–46.0)
Hemoglobin: 11.3 g/dL — ABNORMAL LOW (ref 12.0–15.0)
MCH: 29.1 pg (ref 26.0–34.0)
MCHC: 30.4 g/dL (ref 30.0–36.0)
MCV: 95.9 fL (ref 80.0–100.0)
Platelets: 280 10*3/uL (ref 150–400)
RBC: 3.88 MIL/uL (ref 3.87–5.11)
RDW: 19.1 % — ABNORMAL HIGH (ref 11.5–15.5)
WBC: 9.8 10*3/uL (ref 4.0–10.5)
nRBC: 0 % (ref 0.0–0.2)

## 2022-12-10 LAB — BASIC METABOLIC PANEL
Anion gap: 13 (ref 5–15)
BUN: 27 mg/dL — ABNORMAL HIGH (ref 8–23)
CO2: 24 mmol/L (ref 22–32)
Calcium: 9.3 mg/dL (ref 8.9–10.3)
Chloride: 101 mmol/L (ref 98–111)
Creatinine, Ser: 1.08 mg/dL — ABNORMAL HIGH (ref 0.44–1.00)
GFR, Estimated: 49 mL/min — ABNORMAL LOW (ref >60)
Glucose, Bld: 118 mg/dL — ABNORMAL HIGH (ref 70–99)
Potassium: 4.1 mmol/L (ref 3.5–5.1)
Sodium: 138 mmol/L (ref 135–145)

## 2022-12-10 LAB — TROPONIN I (HIGH SENSITIVITY)
Troponin I (High Sensitivity): 16 ng/L (ref <18)
Troponin I (High Sensitivity): 19 ng/L — ABNORMAL HIGH (ref <18)

## 2022-12-10 NOTE — Telephone Encounter (Signed)
Pt missed her appt on 11/23/22. Spoke with daughter Corliss Blacker) to reschedule. Daughter stated that they don't wish to make an appt at this time and will call clinic if they do decide to make and appt.  Clinic number provided.  Will route to Darylene Price, FNP. As FYI.

## 2022-12-10 NOTE — ED Triage Notes (Signed)
Pt reports sob for 3 weeks.  Pt also reports chest pain and back pain.   No cough  pt alert  speech clear.

## 2022-12-11 ENCOUNTER — Inpatient Hospital Stay: Payer: Medicare Other

## 2022-12-11 ENCOUNTER — Encounter: Payer: Self-pay | Admitting: Internal Medicine

## 2022-12-11 ENCOUNTER — Emergency Department: Payer: Medicare Other

## 2022-12-11 ENCOUNTER — Other Ambulatory Visit: Payer: Self-pay

## 2022-12-11 DIAGNOSIS — Z950 Presence of cardiac pacemaker: Secondary | ICD-10-CM | POA: Diagnosis not present

## 2022-12-11 DIAGNOSIS — J439 Emphysema, unspecified: Secondary | ICD-10-CM | POA: Diagnosis present

## 2022-12-11 DIAGNOSIS — I130 Hypertensive heart and chronic kidney disease with heart failure and stage 1 through stage 4 chronic kidney disease, or unspecified chronic kidney disease: Secondary | ICD-10-CM | POA: Diagnosis present

## 2022-12-11 DIAGNOSIS — J441 Chronic obstructive pulmonary disease with (acute) exacerbation: Secondary | ICD-10-CM | POA: Diagnosis present

## 2022-12-11 DIAGNOSIS — J9601 Acute respiratory failure with hypoxia: Secondary | ICD-10-CM | POA: Diagnosis present

## 2022-12-11 DIAGNOSIS — I509 Heart failure, unspecified: Secondary | ICD-10-CM | POA: Diagnosis not present

## 2022-12-11 DIAGNOSIS — I2510 Atherosclerotic heart disease of native coronary artery without angina pectoris: Secondary | ICD-10-CM | POA: Diagnosis present

## 2022-12-11 DIAGNOSIS — Z79899 Other long term (current) drug therapy: Secondary | ICD-10-CM | POA: Diagnosis not present

## 2022-12-11 DIAGNOSIS — N183 Chronic kidney disease, stage 3 unspecified: Secondary | ICD-10-CM | POA: Diagnosis present

## 2022-12-11 DIAGNOSIS — Z882 Allergy status to sulfonamides status: Secondary | ICD-10-CM | POA: Diagnosis not present

## 2022-12-11 DIAGNOSIS — I482 Chronic atrial fibrillation, unspecified: Secondary | ICD-10-CM | POA: Diagnosis present

## 2022-12-11 DIAGNOSIS — Z91018 Allergy to other foods: Secondary | ICD-10-CM | POA: Diagnosis not present

## 2022-12-11 DIAGNOSIS — Z7901 Long term (current) use of anticoagulants: Secondary | ICD-10-CM | POA: Diagnosis not present

## 2022-12-11 DIAGNOSIS — Z8249 Family history of ischemic heart disease and other diseases of the circulatory system: Secondary | ICD-10-CM | POA: Diagnosis not present

## 2022-12-11 DIAGNOSIS — I495 Sick sinus syndrome: Secondary | ICD-10-CM | POA: Diagnosis present

## 2022-12-11 DIAGNOSIS — E785 Hyperlipidemia, unspecified: Secondary | ICD-10-CM | POA: Diagnosis present

## 2022-12-11 DIAGNOSIS — I5033 Acute on chronic diastolic (congestive) heart failure: Secondary | ICD-10-CM | POA: Diagnosis present

## 2022-12-11 DIAGNOSIS — R0609 Other forms of dyspnea: Secondary | ICD-10-CM | POA: Diagnosis present

## 2022-12-11 DIAGNOSIS — Z880 Allergy status to penicillin: Secondary | ICD-10-CM | POA: Diagnosis not present

## 2022-12-11 DIAGNOSIS — Z888 Allergy status to other drugs, medicaments and biological substances status: Secondary | ICD-10-CM | POA: Diagnosis not present

## 2022-12-11 DIAGNOSIS — Z23 Encounter for immunization: Secondary | ICD-10-CM | POA: Diagnosis present

## 2022-12-11 LAB — BASIC METABOLIC PANEL
Anion gap: 10 (ref 5–15)
BUN: 25 mg/dL — ABNORMAL HIGH (ref 8–23)
CO2: 25 mmol/L (ref 22–32)
Calcium: 8.8 mg/dL — ABNORMAL LOW (ref 8.9–10.3)
Chloride: 104 mmol/L (ref 98–111)
Creatinine, Ser: 1.21 mg/dL — ABNORMAL HIGH (ref 0.44–1.00)
GFR, Estimated: 43 mL/min — ABNORMAL LOW (ref >60)
Glucose, Bld: 120 mg/dL — ABNORMAL HIGH (ref 70–99)
Potassium: 4 mmol/L (ref 3.5–5.1)
Sodium: 139 mmol/L (ref 135–145)

## 2022-12-11 LAB — BRAIN NATRIURETIC PEPTIDE: B Natriuretic Peptide: 1403.3 pg/mL — ABNORMAL HIGH (ref 0.0–100.0)

## 2022-12-11 MED ORDER — ENSURE ENLIVE PO LIQD
237.0000 mL | Freq: Two times a day (BID) | ORAL | Status: DC
  Administered 2022-12-12 – 2022-12-15 (×6): 237 mL via ORAL

## 2022-12-11 MED ORDER — OYSTER SHELL CALCIUM/D3 500-5 MG-MCG PO TABS
1.0000 | ORAL_TABLET | ORAL | Status: DC
  Administered 2022-12-11 – 2022-12-13 (×2): 1 via ORAL
  Filled 2022-12-11 (×2): qty 1

## 2022-12-11 MED ORDER — SERTRALINE HCL 50 MG PO TABS
50.0000 mg | ORAL_TABLET | Freq: Every day | ORAL | Status: DC
  Administered 2022-12-12 – 2022-12-15 (×4): 50 mg via ORAL
  Filled 2022-12-11 (×4): qty 1

## 2022-12-11 MED ORDER — ACETAMINOPHEN 650 MG RE SUPP: 650.0000 mg | Freq: Four times a day (QID) | RECTAL | Status: DC | PRN

## 2022-12-11 MED ORDER — ONDANSETRON HCL 4 MG PO TABS: 4.0000 mg | ORAL_TABLET | Freq: Four times a day (QID) | ORAL | Status: DC | PRN

## 2022-12-11 MED ORDER — IOHEXOL 350 MG/ML SOLN
75.0000 mL | Freq: Once | INTRAVENOUS | Status: AC | PRN
  Administered 2022-12-11: 75 mL via INTRAVENOUS

## 2022-12-11 MED ORDER — PNEUMOCOCCAL 20-VAL CONJ VACC 0.5 ML IM SUSY
0.5000 mL | PREFILLED_SYRINGE | INTRAMUSCULAR | Status: DC
  Filled 2022-12-11 (×2): qty 0.5

## 2022-12-11 MED ORDER — VITAMIN D 25 MCG (1000 UNIT) PO TABS
2000.0000 [IU] | ORAL_TABLET | Freq: Every day | ORAL | Status: DC
  Administered 2022-12-12 – 2022-12-15 (×4): 2000 [IU] via ORAL
  Filled 2022-12-11 (×4): qty 2

## 2022-12-11 MED ORDER — ATORVASTATIN CALCIUM 20 MG PO TABS
80.0000 mg | ORAL_TABLET | Freq: Every day | ORAL | Status: DC
  Administered 2022-12-12 – 2022-12-15 (×4): 80 mg via ORAL
  Filled 2022-12-11: qty 4
  Filled 2022-12-11 (×2): qty 1
  Filled 2022-12-11: qty 4

## 2022-12-11 MED ORDER — FUROSEMIDE 10 MG/ML IJ SOLN
40.0000 mg | Freq: Two times a day (BID) | INTRAMUSCULAR | Status: DC
  Administered 2022-12-11 – 2022-12-12 (×2): 40 mg via INTRAVENOUS
  Filled 2022-12-11 (×2): qty 4

## 2022-12-11 MED ORDER — REGADENOSON 0.4 MG/5ML IV SOLN
0.4000 mg | Freq: Once | INTRAVENOUS | Status: AC
  Administered 2022-12-11: 0.4 mg via INTRAVENOUS
  Filled 2022-12-11: qty 5

## 2022-12-11 MED ORDER — ALBUTEROL SULFATE (2.5 MG/3ML) 0.083% IN NEBU
2.5000 mg | INHALATION_SOLUTION | RESPIRATORY_TRACT | Status: DC | PRN
  Administered 2022-12-13 – 2022-12-15 (×2): 2.5 mg via RESPIRATORY_TRACT
  Filled 2022-12-11 (×2): qty 3

## 2022-12-11 MED ORDER — APIXABAN 2.5 MG PO TABS
2.5000 mg | ORAL_TABLET | Freq: Two times a day (BID) | ORAL | Status: DC
  Administered 2022-12-11 – 2022-12-15 (×8): 2.5 mg via ORAL
  Filled 2022-12-11 (×9): qty 1

## 2022-12-11 MED ORDER — FUROSEMIDE 10 MG/ML IJ SOLN: 40.0000 mg | Freq: Two times a day (BID) | INTRAMUSCULAR | Status: DC

## 2022-12-11 MED ORDER — INFLUENZA VAC A&B SA ADJ QUAD 0.5 ML IM PRSY
0.5000 mL | PREFILLED_SYRINGE | INTRAMUSCULAR | Status: AC
  Administered 2022-12-12: 0.5 mL via INTRAMUSCULAR
  Filled 2022-12-11: qty 0.5

## 2022-12-11 MED ORDER — METOPROLOL SUCCINATE ER 25 MG PO TB24
12.5000 mg | ORAL_TABLET | Freq: Every day | ORAL | Status: DC
  Administered 2022-12-12 – 2022-12-15 (×4): 12.5 mg via ORAL
  Filled 2022-12-11 (×4): qty 1

## 2022-12-11 MED ORDER — HYDROCODONE-ACETAMINOPHEN 5-325 MG PO TABS
1.0000 | ORAL_TABLET | ORAL | Status: DC | PRN
  Administered 2022-12-15: 1 via ORAL
  Filled 2022-12-11: qty 1

## 2022-12-11 MED ORDER — ACETAMINOPHEN 325 MG PO TABS: 650.0000 mg | ORAL_TABLET | Freq: Four times a day (QID) | ORAL | Status: DC | PRN

## 2022-12-11 MED ORDER — AMIODARONE HCL 200 MG PO TABS: 200.0000 mg | ORAL_TABLET | Freq: Two times a day (BID) | ORAL | Status: DC

## 2022-12-11 MED ORDER — ONDANSETRON HCL 4 MG/2ML IJ SOLN: 4.0000 mg | Freq: Four times a day (QID) | INTRAMUSCULAR | Status: DC | PRN

## 2022-12-11 MED ORDER — AMIODARONE HCL 200 MG PO TABS
200.0000 mg | ORAL_TABLET | Freq: Every day | ORAL | Status: DC
  Administered 2022-12-12 – 2022-12-15 (×4): 200 mg via ORAL
  Filled 2022-12-11 (×4): qty 1

## 2022-12-11 MED ORDER — METOPROLOL TARTRATE 25 MG PO TABS: 12.5000 mg | ORAL_TABLET | Freq: Two times a day (BID) | ORAL | Status: DC

## 2022-12-11 MED ORDER — TECHNETIUM TC 99M TETROFOSMIN IV KIT
31.4100 | PACK | Freq: Once | INTRAVENOUS | Status: AC | PRN
  Administered 2022-12-11: 31.41 via INTRAVENOUS

## 2022-12-11 MED ORDER — FUROSEMIDE 10 MG/ML IJ SOLN
40.0000 mg | Freq: Once | INTRAMUSCULAR | Status: AC
  Administered 2022-12-11: 40 mg via INTRAVENOUS
  Filled 2022-12-11: qty 4

## 2022-12-11 MED ORDER — TECHNETIUM TC 99M TETROFOSMIN IV KIT
10.4700 | PACK | Freq: Once | INTRAVENOUS | Status: AC | PRN
  Administered 2022-12-11: 10.47 via INTRAVENOUS

## 2022-12-11 NOTE — Assessment & Plan Note (Signed)
Albuterol as needed 

## 2022-12-11 NOTE — Progress Notes (Signed)
PROGRESS NOTE  Anne Mitchell    DOB: [birth year], 89 y.o.  [id number]    Code Status: Full Code   DOA: 12/10/2022   LOS: 0   Brief hospital course  Anne Mitchell is a 89 y.o. 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  Her recent exacerbation was attributed to discontinuation of furosemide, spironolactone, Imdur and lisinopril because of hypotension.  She was restarted on furosemide and spironolactone at discharge on 3/3.  She is compliant with her meds.  Patient was last seen by her cardiologist on 3/16 at which time she continued to be symptomatic with dyspnea on exertion and intermittent chest tightness and she has an upcoming stress test on 4/17.  Her medications were continued. At the present time patient denies chest pain, cough, fever or chills ED course and data review: Afebrile, heart rate in the 50s, tachypneic in the mid [id number] and systolic blood pressure in the 120s to 130s with O2 sat in the mid 90s on room air. Troponin 19And BNP 1404 unchanged from 1414 about 3 weeks prior.  CBC and BMP unremarkable.  EKG, personally viewed and interpreted shows A-fib at 52 with nonspecific ST-T wave changes. CTA chest negative for PE but shows bilateral airspace disease representing likely edema although multifocal pneumonia could have similar appearance as detailed below: IMPRESSION: 1. No evidence of significant pulmonary embolus. 2. Cardiac enlargement with reflux of contrast material into the hepatic veins suggesting right heart failure. 3. Diffuse bilateral airspace and interstitial infiltration in the lungs most likely representing edema although  multifocal pneumonia could also have this appearance. 4. Small bilateral pleural effusions.   Patient given a dose of Lasix 40 mg and hospitalist consulted for admission.   12/11/22 -stress test.  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  VTE ppx: apixaban (ELIQUIS) tablet 2.5 mg Start: 12/11/22 1000 apixaban (ELIQUIS) tablet 2.5 mg   Diet:     Diet   Diet NPO time specified Except for: Sips with Meds   Consultants: Cardiology   Subjective 12/11/22    Pt reports no current chest pain, SOB.   Objective   Vitals:   12/11/22 0530 12/11/22 0600 12/11/22 0630 12/11/22 0700  BP: 107/69 (!) 118/54 133/66 (!) 140/67  Pulse: 64 (!) 58 63 (!) 59  Resp: (!) 28 (!) 31 (!) 29 (!)  28  Temp:    (!) 97.5 F (36.4 C)  TempSrc:    Oral  SpO2: 95% 97% 95% 94%  Weight:      Height:       No intake or output data in the 24 hours ending 12/11/22 0759 Filed Weights   12/10/22 1934  Weight: 52 kg    Labs   I have personally reviewed the following labs and imaging studies CBC     Component Value Date/Time   WBC 9.8 12/10/2022 1949   RBC 3.88 12/10/2022 1949   HGB 11.3 (L) 12/10/2022 1949   HCT 37.2 12/10/2022 1949   PLT 280 12/10/2022 1949   MCV 95.9 12/10/2022 1949   MCH 29.1 12/10/2022 1949   MCHC 30.4 12/10/2022 1949   RDW 19.1 (H) 12/10/2022 1949   LYMPHSABS 1.5 11/18/2022 0743   MONOABS 0.8 11/18/2022 0743   EOSABS 0.2 11/18/2022 0743   BASOSABS 0.1 11/18/2022 0743      Latest Ref Rng & Units 12/10/2022    7:49 PM 11/18/2022    7:43 AM 11/15/2022    8:16 AM  BMP  Glucose 70 - 99 mg/dL 118  115  127   BUN 8 - 23 mg/dL 27  30  28    Creatinine 0.44 - 1.00 mg/dL 1.08  1.32  1.14   Sodium 135 - 145 mmol/L 138  135  135   Potassium 3.5 - 5.1 mmol/L 4.1  4.4  3.4   Chloride 98 - 111 mmol/L 101  103  102   CO2 22 - 32 mmol/L 24  24  24    Calcium 8.9 - 10.3 mg/dL 9.3  8.8  8.6     CT Angio Chest PE W/Cm &/Or Wo Cm  Result Date: 12/11/2022 CLINICAL DATA:  Pulmonary embolus suspected with high probability. Chest pain, back pain, and shortness of breath for 3 weeks. EXAM: CT ANGIOGRAPHY CHEST WITH CONTRAST TECHNIQUE: Multidetector CT imaging of the chest was performed using the standard protocol during bolus administration of intravenous contrast. Multiplanar CT image reconstructions and MIPs were obtained to evaluate the vascular anatomy. RADIATION DOSE REDUCTION: This exam was performed according to the departmental dose-optimization program which includes automated exposure control, adjustment of the mA and/or kV according to patient size and/or use of iterative reconstruction technique. CONTRAST:  64mL OMNIPAQUE IOHEXOL 350 MG/ML SOLN COMPARISON:  11/01/2022 FINDINGS: Cardiovascular: Good opacification of the central and segmental pulmonary arteries. No focal filling defects. No evidence of significant pulmonary embolus. Cardiac enlargement. Reflux of contrast material into the hepatic veins suggesting right heart failure. Normal caliber thoracic aorta.  Calcification of the aorta and coronary arteries. Mediastinum/Nodes: Esophagus is decompressed. Mediastinal lymph nodes are moderately prominent without pathologic enlargement, likely reactive. No change since prior study. Thyroid gland is unremarkable. Lungs/Pleura: Small bilateral pleural effusions. Diffuse interstitial and alveolar infiltrates throughout the lungs, progressing since prior study. This likely represents pulmonary edema although multifocal pneumonia could also have this appearance. Airways are clear. Upper Abdomen: No acute abnormalities. Musculoskeletal: Degenerative changes in the spine. Review of the MIP images confirms the above findings. IMPRESSION: 1. No evidence of significant pulmonary embolus. 2. Cardiac enlargement with reflux of contrast material into the hepatic veins suggesting right heart failure. 3. Diffuse bilateral airspace and interstitial infiltration in the lungs most likely representing edema although multifocal pneumonia could also have this appearance. 4. Small bilateral pleural effusions. Electronically Signed   By: Lucienne Capers M.D.   On: 12/11/2022 03:15   DG Chest  2 View  Result Date: 12/10/2022 CLINICAL DATA:  Shortness of breath EXAM: CHEST - 2 VIEW COMPARISON:  CXR 11/18/22 FINDINGS: Loop recorder device projects over the left hemithorax. No pleural effusion. No pneumothorax. Unchanged asymmetric elevation of the right hemidiaphragm. Surgical clips in the right axilla. Unchanged cardiac and mediastinal contours. Focal opacity. Unchanged prominent bilateral interstitial opacities, which could represent pulmonary venous congestion or atypical infection. Visualized upper unremarkable. No radiographically apparent displaced rib fractures. IMPRESSION: Unchanged prominent bilateral interstitial opacities, which could represent pulmonary venous congestion or atypical infection. Electronically Signed   By: Marin Roberts M.D.   On: 12/10/2022 20:17    Disposition Plan &  Communication  Patient status: Inpatient  Admitted From: Home Planned disposition location: Home Anticipated discharge date: 3/30 pending cardiology clearance   Family Communication: none at bedside.   *no charge. Same day as admission note.     Author: Richarda Osmond, DO [facility] 12/11/2022, 7:59 AM   Available by Epic secure chat 7AM-7PM. If 7PM-7AM, please contact night-coverage.  [facility] contact information found on [email or web address].

## 2022-12-11 NOTE — Assessment & Plan Note (Addendum)
History of stress cardiomyopathy with a EF 25 to 30% IV Lasix Continue metoprolol Daily weights with intake and output monitoring Patient had recent echo in February with EF 50 to 55% and G1 DD

## 2022-12-11 NOTE — H&P (Signed)
History and Physical    Patient: Anne Mitchell DOB: 08/25/1932 DOA: 12/10/2022 DOS: the patient was seen and examined on 12/11/2022 PCP: Valera Castle, MD  Patient coming from: Home  Chief Complaint:  Chief Complaint  Patient presents with   Shortness of Breath    HPI: Anne Mitchell is a 89 y.o. 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  Her recent exacerbation was attributed to discontinuation of furosemide, spironolactone, Imdur and lisinopril because of hypotension.  She was restarted on furosemide and spironolactone at discharge on 3/3.  She is compliant with her meds.  Patient was last seen by her cardiologist on 3/16 at which time she continued to be symptomatic with dyspnea on exertion and intermittent chest tightness and she has an upcoming stress test on 4/17.  Her medications were continued. At the present time patient denies chest pain, cough, fever or chills ED course and data review: Afebrile, heart rate in the 50s, tachypneic in the mid [id number] and systolic blood pressure in the 120s to 130s with O2 sat in the mid 90s on room air. Troponin 19And BNP 1404 unchanged from 1414 about 3 weeks prior.  CBC and BMP unremarkable.  EKG, personally viewed and interpreted shows A-fib at 52 with nonspecific ST-T wave changes. CTA chest negative for PE but shows bilateral airspace disease representing likely edema although multifocal pneumonia could have similar appearance as detailed below: IMPRESSION: 1. No evidence of significant pulmonary embolus. 2. Cardiac enlargement with reflux of contrast material into the hepatic  veins suggesting right heart failure. 3. Diffuse bilateral airspace and interstitial infiltration in the lungs most likely representing edema although multifocal pneumonia could also have this appearance. 4. Small bilateral pleural effusions.  Patient given a dose of Lasix 40 mg and hospitalist consulted for admission.   Review of Systems: As mentioned in the history of present illness. All other systems reviewed and are negative.  Past Medical History:  Diagnosis Date   Diastolic heart failure (HCC)    Emphysema (subcutaneous) (surgical) resulting from a procedure    Not correct   Emphysema lung ([place])    Hypertension    Past Surgical History:  Procedure Laterality Date   LEFT HEART CATH AND CORONARY ANGIOGRAPHY N/A 03/22/2020   Procedure: LEFT HEART CATH AND CORONARY ANGIOGRAPHY;  Surgeon: Corey Skains, MD;  Location: [facility];  Service: Cardiovascular;  Laterality: N/A;   PACEMAKER LEADLESS INSERTION N/A 11/05/2022   Procedure: PACEMAKER LEADLESS INSERTION;  Surgeon: Isaias Cowman, MD;  Location: [facility];  Service: Cardiovascular;  Laterality: N/A;   Social History:  reports that she has never smoked. She has never used smokeless tobacco. She reports that she does not drink alcohol and does not use drugs.  Allergies  Allergen Reactions   Other Rash and Swelling    "mycin" meds  Gin  Gin alcohol. Swelling of the genital area   Penicillin G Itching   Banana Swelling   Penicillins Swelling   Sulfa Antibiotics     Family History  Problem Relation Age of Onset   Colon cancer Mother    Hypertension Mother     Prior to Admission medications   Medication  Sig Start Date End Date Taking? Authorizing Provider  albuterol (VENTOLIN HFA) 108 (90 Base) MCG/ACT inhaler Inhale 2 puffs into the lungs every 6 (six) hours as needed for wheezing or shortness of breath. 09/16/22   Lavonia Drafts, MD  amiodarone (PACERONE) 200 MG tablet Take 1 tablet (200  mg total) by mouth 2 (two) times daily for 9 days. 11/07/22 11/16/22  Enzo Bi, MD  amiodarone (PACERONE) 200 MG tablet Take 1 tablet (200 mg total) by mouth daily. 11/16/22 02/14/23  Enzo Bi, MD  atorvastatin (LIPITOR) 80 MG tablet Take 1 tablet (80 mg total) by mouth daily. 03/23/20   Loletha Grayer, MD  Calcium Carb-Cholecalciferol (CALCIUM 500 + D PO) Take 1 tablet by mouth as directed. Take 1 tablet three times a week on Sunday, Wednesday and Friday mornings.    [provider]  Cholecalciferol 50 MCG (2000 UT) CAPS Take 2,000 Units by mouth daily.    [provider]  ELIQUIS 2.5 MG TABS tablet Take 1 tablet (2.5 mg total) by mouth 2 (two) times daily. 11/07/22 02/05/23  Enzo Bi, MD  feeding supplement (ENSURE ENLIVE / ENSURE PLUS) LIQD Take 237 mLs by mouth 2 (two) times daily between meals. 11/07/22   Enzo Bi, MD  furosemide (LASIX) 40 MG tablet Take 1 tablet (40 mg total) by mouth daily. 11/16/22   Jennye Boroughs, MD  metoprolol tartrate (LOPRESSOR) 25 MG tablet Take 0.5 tablets (12.5 mg total) by mouth 2 (two) times daily. 11/07/22 02/05/23  Enzo Bi, MD  Multiple Vitamins-Minerals (VITRUM SENIOR) TABS Take by mouth.    [provider]  sertraline (ZOLOFT) 50 MG tablet Take 50 mg by mouth daily. 01/12/20   [provider]    Physical Exam: Vitals:   12/11/22 0130 12/11/22 0200 12/11/22 0230 12/11/22 0300  BP: (!) 111/48 (!) 127/56 (!) 125/55 122/60  Pulse:    (!) 59  Resp: (!) 22 (!) 25  (!) 30  Temp:      TempSrc:      SpO2: 98% 95% 97% 95%  Weight:      Height:       Physical Exam Vitals and nursing note reviewed.  Constitutional:      General: She is not in acute distress. HENT:     Head: Normocephalic and atraumatic.  Cardiovascular:     Rate and Rhythm: Normal rate and regular rhythm.     Heart sounds: Normal heart sounds.  Pulmonary:     Effort: Tachypnea present.     Breath sounds: Examination of the right-lower field reveals decreased  breath sounds. Examination of the left-lower field reveals decreased breath sounds. Decreased breath sounds present.  Abdominal:     Palpations: Abdomen is soft.     Tenderness: There is no abdominal tenderness.  Neurological:     Mental Status: Mental status is at baseline.     Labs on Admission: I have personally reviewed following labs and imaging studies  CBC: Recent Labs  Lab 12/10/22 1949  WBC 9.8  HGB 11.3*  HCT 37.2  MCV 95.9  PLT 123456   Basic Metabolic Panel: Recent Labs  Lab 12/10/22 1949  NA 138  K 4.1  CL 101  CO2 24  GLUCOSE 118*  BUN 27*  CREATININE 1.08*  CALCIUM 9.3   GFR: Estimated Creatinine Clearance: 27.2 mL/min (A) (by C-G formula based on SCr of 1.08 mg/dL (H)). Liver Function Tests: No results for input(s): "AST", "ALT", "ALKPHOS", "BILITOT", "PROT", "ALBUMIN" in the last 168  hours. No results for input(s): "LIPASE", "AMYLASE" in the last 168 hours. No results for input(s): "AMMONIA" in the last 168 hours. Coagulation Profile: No results for input(s): "INR", "PROTIME" in the last 168 hours. Cardiac Enzymes: No results for input(s): "CKTOTAL", "CKMB", "CKMBINDEX", "TROPONINI" in the last 168 hours. BNP (last 3 results) No results for input(s): "PROBNP" in the last 8760 hours. HbA1C: No results for input(s): "HGBA1C" in the last 72 hours. CBG: No results for input(s): "GLUCAP" in the last 168 hours. Lipid Profile: No results for input(s): "CHOL", "HDL", "LDLCALC", "TRIG", "CHOLHDL", "LDLDIRECT" in the last 72 hours. Thyroid Function Tests: No results for input(s): "TSH", "T4TOTAL", "FREET4", "T3FREE", "THYROIDAB" in the last 72 hours. Anemia Panel: No results for input(s): "VITAMINB12", "FOLATE", "FERRITIN", "TIBC", "IRON", "RETICCTPCT" in the last 72 hours. Urine analysis:    Component Value Date/Time   COLORURINE YELLOW (A) 11/18/2022 1055   APPEARANCEUR HAZY (A) 11/18/2022 1055   LABSPEC 1.010 11/18/2022 1055   PHURINE 6.0  11/18/2022 1055   GLUCOSEU NEGATIVE 11/18/2022 1055   HGBUR NEGATIVE 11/18/2022 1055   BILIRUBINUR NEGATIVE 11/18/2022 1055   KETONESUR NEGATIVE 11/18/2022 1055   PROTEINUR NEGATIVE 11/18/2022 1055   NITRITE NEGATIVE 11/18/2022 1055   LEUKOCYTESUR LARGE (A) 11/18/2022 1055    Radiological Exams on Admission: CT Angio Chest PE W/Cm &/Or Wo Cm  Result Date: 12/11/2022 CLINICAL DATA:  Pulmonary embolus suspected with high probability. Chest pain, back pain, and shortness of breath for 3 weeks. EXAM: CT ANGIOGRAPHY CHEST WITH CONTRAST TECHNIQUE: Multidetector CT imaging of the chest was performed using the standard protocol during bolus administration of intravenous contrast. Multiplanar CT image reconstructions and MIPs were obtained to evaluate the vascular anatomy. RADIATION DOSE REDUCTION: This exam was performed according to the departmental dose-optimization program which includes automated exposure control, adjustment of the mA and/or kV according to patient size and/or use of iterative reconstruction technique. CONTRAST:  35mL OMNIPAQUE IOHEXOL 350 MG/ML SOLN COMPARISON:  11/01/2022 FINDINGS: Cardiovascular: Good opacification of the central and segmental pulmonary arteries. No focal filling defects. No evidence of significant pulmonary embolus. Cardiac enlargement. Reflux of contrast material into the hepatic veins suggesting right heart failure. Normal caliber thoracic aorta. Calcification of the aorta and coronary arteries. Mediastinum/Nodes: Esophagus is decompressed. Mediastinal lymph nodes are moderately prominent without pathologic enlargement, likely reactive. No change since prior study. Thyroid gland is unremarkable. Lungs/Pleura: Small bilateral pleural effusions. Diffuse interstitial and alveolar infiltrates throughout the lungs, progressing since prior study. This likely represents pulmonary edema although multifocal pneumonia could also have this appearance. Airways are clear. Upper  Abdomen: No acute abnormalities. Musculoskeletal: Degenerative changes in the spine. Review of the MIP images confirms the above findings. IMPRESSION: 1. No evidence of significant pulmonary embolus. 2. Cardiac enlargement with reflux of contrast material into the hepatic veins suggesting right heart failure. 3. Diffuse bilateral airspace and interstitial infiltration in the lungs most likely representing edema although multifocal pneumonia could also have this appearance. 4. Small bilateral pleural effusions. Electronically Signed   By: Lucienne Capers M.D.   On: 12/11/2022 03:15   DG Chest 2 View  Result Date: 12/10/2022 CLINICAL DATA:  Shortness of breath EXAM: CHEST - 2 VIEW COMPARISON:  CXR 11/18/22 FINDINGS: Loop recorder device projects over the left hemithorax. No pleural effusion. No pneumothorax. Unchanged asymmetric elevation of the right hemidiaphragm. Surgical clips in the right axilla. Unchanged cardiac and mediastinal contours. Focal opacity. Unchanged prominent bilateral interstitial opacities, which could represent pulmonary venous congestion or atypical infection. Visualized  upper unremarkable. No radiographically apparent displaced rib fractures. IMPRESSION: Unchanged prominent bilateral interstitial opacities, which could represent pulmonary venous congestion or atypical infection. Electronically Signed   By: Marin Roberts M.D.   On: 12/10/2022 20:17     Data Reviewed: Relevant notes from primary care and specialist visits, past discharge summaries as available in EHR, including Care Everywhere. Prior diagnostic testing as pertinent to current admission diagnoses Updated medications and problem lists for reconciliation ED course, including vitals, labs, imaging, treatment and response to treatment Triage notes, nursing and pharmacy notes and ED provider's notes Notable results as noted in HPI   Assessment and Plan: Acute on chronic diastolic CHF (congestive heart failure)  (HCC) History of stress cardiomyopathy with a EF 25 to 30% IV Lasix Continue metoprolol Daily weights with intake and output monitoring Patient had recent echo in February with EF 50 to 55% and G1 DD  Coronary artery disease Nonobstructive CAD on cardiac cath in 2021 Continue apixaban and atorvastatin and metoprolol Patient has an upcoming stress test April 17 Will keep n.p.o. in case cardiology decides to do procedure while in-house Cardiology consult  S/P placement of cardiac pacemaker Pacemaker was placed for junctional bradycardia 10/2022  Emphysema lung (HCC) Albuterol as needed  AF (paroxysmal atrial fibrillation) (HCC) Chronic anticoagulation Continue Eliquis, amiodarone and metoprolol        DVT prophylaxis: Lovenox 55 to 60% and G1 DD  Consults: Cardiology, Dr. Clayborn Bigness  Advance Care Planning:   Code Status: Prior   Family Communication: none  Disposition Plan: Back to previous home environment  Severity of Illness: The appropriate patient status for this patient is INPATIENT. Inpatient status is judged to be reasonable and necessary in order to provide the required intensity of service to ensure the patient's safety. The patient's presenting symptoms, physical exam findings, and initial radiographic and laboratory data in the context of their chronic comorbidities is felt to place them at high risk for further clinical deterioration. Furthermore, it is not anticipated that the patient will be medically stable for discharge from the hospital within 2 midnights of admission.   * I certify that at the point of admission it is my clinical judgment that the patient will require inpatient hospital care spanning beyond 2 midnights from the point of admission due to high intensity of service, high risk for further deterioration and high frequency of surveillance required.*  Author: Athena Masse, MD 12/11/2022 4:20 AM  For on call review [email or web address].

## 2022-12-11 NOTE — Consult Note (Addendum)
CARDIOLOGY CONSULT NOTE               Patient ID: Anne Mitchell MRN: [id number] DOB/AGE: 18-Dec-1931 89 y.o.  Admit date: 12/10/2022 Referring Physician Dr. Judd Gaudier hospitalist Primary Physician Dr. Jamal Maes [name] primary Primary Cardiologist Leroy Libman, [place] [facility] clinic Reason for Consultation shortness of breath dyspnea congestive heart failure  HPI: 89 year old female history of nonobstructive coronary disease chronic diastolic congestive heart failure hypertension emphysema junctional bradycardia sick sinus syndrome status post permanent pacemaker atrial fibrillation on amiodarone multiple recent hospitalizations for heart failure.  Patient complains of increasing fatigue weakness on spironolactone Imdur lisinopril hypertension.  Patient was restarted on diuretic therapy but still has worsening shortness of breath over the last few days she complained that she could not breathe she came to the emergency room for evaluation and admission found to have elevated BNP of over 1400 patient presented to the emergency room with rapid atrial fibrillation where she has chronic A-fib.  Patient denies any chest pain no significant leg swelling just worsening dyspnea shortness of breath  Review of systems complete and found to be negative unless listed above     Past Medical History:  Diagnosis Date   Diastolic heart failure (HCC)    Emphysema (subcutaneous) (surgical) resulting from a procedure    Not correct   Emphysema lung ([place])    Hypertension     Past Surgical History:  Procedure Laterality Date   LEFT HEART CATH AND CORONARY ANGIOGRAPHY N/A 03/22/2020   Procedure: LEFT HEART CATH AND CORONARY ANGIOGRAPHY;  Surgeon: Corey Skains, MD;  Location: [facility];  Service: Cardiovascular;  Laterality: N/A;   PACEMAKER LEADLESS INSERTION N/A 11/05/2022   Procedure: PACEMAKER LEADLESS INSERTION;  Surgeon: Isaias Cowman, MD;  Location: [facility];   Service: Cardiovascular;  Laterality: N/A;    (Not in a hospital admission)  Social History   Socioeconomic History   Marital status: Unknown    Spouse name: Not on file   Number of children: Not on file   Years of education: Not on file   Highest education level: Not on file  Occupational History   Not on file  Tobacco Use   Smoking status: Never   Smokeless tobacco: Never  Vaping Use   Vaping Use: Never used  Substance and Sexual Activity   Alcohol use: Never   Drug use: Never   Sexual activity: Not on file  Other Topics Concern   Not on file  Social History Narrative   Not on file   Social Determinants of Health   Financial Resource Strain: Not on file  Food Insecurity: No Food Insecurity (10/31/2022)   Hunger Vital Sign    Worried About Running Out of Food in the Last Year: Never true    Ran Out of Food in the Last Year: Never true  Transportation Needs: No Transportation Needs (10/31/2022)   PRAPARE - Hydrologist (Medical): No    Lack of Transportation (Non-Medical): No  Physical Activity: Not on file  Stress: Not on file  Social Connections: Not on file  Intimate Partner Violence: Not At Risk (10/31/2022)   Humiliation, Afraid, Rape, and Kick questionnaire    Fear of Current or Ex-Partner: No    Emotionally Abused: No    Physically Abused: No    Sexually Abused: No    Family History  Problem Relation Age of Onset   Colon cancer Mother    Hypertension Mother  Review of systems complete and found to be negative unless listed above      PHYSICAL EXAM  General: Well developed, well nourished, in no acute distress HEENT:  Normocephalic and atramatic Neck:  No JVD.  Lungs: Clear bilaterally to auscultation and percussion. Heart: Irregular irregular. Normal S1 and S2 without gallops or murmurs.  Abdomen: Bowel sounds are positive, abdomen soft and non-tender  Msk:  Back normal, normal gait. Normal strength and tone for  age. Extremities: No clubbing, cyanosis or edema.   Neuro: Alert and oriented X 3. Psych:  Good affect, responds appropriately  Labs:   Lab Results  Component Value Date   WBC 9.8 12/10/2022   HGB 11.3 (L) 12/10/2022   HCT 37.2 12/10/2022   MCV 95.9 12/10/2022   PLT 280 12/10/2022    Recent Labs  Lab 12/10/22 1949  NA 138  K 4.1  CL 101  CO2 24  BUN 27*  CREATININE 1.08*  CALCIUM 9.3  GLUCOSE 118*   No results found for: "CKTOTAL", "CKMB", "CKMBINDEX", "TROPONINI"  Lab Results  Component Value Date   CHOL 157 03/21/2020   Lab Results  Component Value Date   HDL 75 03/21/2020   Lab Results  Component Value Date   LDLCALC 74 03/21/2020   Lab Results  Component Value Date   TRIG 39 03/21/2020   Lab Results  Component Value Date   CHOLHDL 2.1 03/21/2020   No results found for: "LDLDIRECT"    Radiology: CT Angio Chest PE W/Cm &/Or Wo Cm  Result Date: 12/11/2022 CLINICAL DATA:  Pulmonary embolus suspected with high probability. Chest pain, back pain, and shortness of breath for 3 weeks. EXAM: CT ANGIOGRAPHY CHEST WITH CONTRAST TECHNIQUE: Multidetector CT imaging of the chest was performed using the standard protocol during bolus administration of intravenous contrast. Multiplanar CT image reconstructions and MIPs were obtained to evaluate the vascular anatomy. RADIATION DOSE REDUCTION: This exam was performed according to the departmental dose-optimization program which includes automated exposure control, adjustment of the mA and/or kV according to patient size and/or use of iterative reconstruction technique. CONTRAST:  54mL OMNIPAQUE IOHEXOL 350 MG/ML SOLN COMPARISON:  11/01/2022 FINDINGS: Cardiovascular: Good opacification of the central and segmental pulmonary arteries. No focal filling defects. No evidence of significant pulmonary embolus. Cardiac enlargement. Reflux of contrast material into the hepatic veins suggesting right heart failure. Normal caliber thoracic  aorta. Calcification of the aorta and coronary arteries. Mediastinum/Nodes: Esophagus is decompressed. Mediastinal lymph nodes are moderately prominent without pathologic enlargement, likely reactive. No change since prior study. Thyroid gland is unremarkable. Lungs/Pleura: Small bilateral pleural effusions. Diffuse interstitial and alveolar infiltrates throughout the lungs, progressing since prior study. This likely represents pulmonary edema although multifocal pneumonia could also have this appearance. Airways are clear. Upper Abdomen: No acute abnormalities. Musculoskeletal: Degenerative changes in the spine. Review of the MIP images confirms the above findings. IMPRESSION: 1. No evidence of significant pulmonary embolus. 2. Cardiac enlargement with reflux of contrast material into the hepatic veins suggesting right heart failure. 3. Diffuse bilateral airspace and interstitial infiltration in the lungs most likely representing edema although multifocal pneumonia could also have this appearance. 4. Small bilateral pleural effusions. Electronically Signed   By: Lucienne Capers M.D.   On: 12/11/2022 03:15   DG Chest 2 View  Result Date: 12/10/2022 CLINICAL DATA:  Shortness of breath EXAM: CHEST - 2 VIEW COMPARISON:  CXR 11/18/22 FINDINGS: Loop recorder device projects over the left hemithorax. No pleural effusion. No pneumothorax. Unchanged asymmetric  elevation of the right hemidiaphragm. Surgical clips in the right axilla. Unchanged cardiac and mediastinal contours. Focal opacity. Unchanged prominent bilateral interstitial opacities, which could represent pulmonary venous congestion or atypical infection. Visualized upper unremarkable. No radiographically apparent displaced rib fractures. IMPRESSION: Unchanged prominent bilateral interstitial opacities, which could represent pulmonary venous congestion or atypical infection. Electronically Signed   By: Lorenza Cambridge M.D.   On: 12/10/2022 20:17   DG Chest  Portable 1 View  Result Date: 11/18/2022 CLINICAL DATA:  89 year old female with shortness of breath. Lower abdominal pain. Recent CHF, pacemaker placement. EXAM: PORTABLE CHEST 1 VIEW COMPARISON:  Portable chest 11/12/2022 and earlier. FINDINGS: Portable AP semi upright view at 0707 hours. Small suspected lead less cardiac pacemaker again projects over the left lower heart border, new since 11/04/2022. Continued somewhat low lung volumes. Stable cardiomegaly and mediastinal contours. Visualized tracheal air column is within normal limits. No pneumothorax, pleural effusion or consolidation. Stable pulmonary vascularity without overt edema. No areas of worsening ventilation. Negative visible bowel gas. No acute osseous abnormality identified. Right chest wall/breast surgical clips. IMPRESSION: Stable small lead-less cardiac pacemaker. No overt edema, acute cardiopulmonary abnormality. Electronically Signed   By: Odessa Fleming M.D.   On: 11/18/2022 07:52   DG Chest Portable 1 View  Result Date: 11/12/2022 CLINICAL DATA:  Suspected PNE. EXAM: PORTABLE CHEST 1 VIEW COMPARISON:  Chest radiograph 11/04/2022 FINDINGS: The cardiomediastinal silhouette is unchanged. A new leadless pacemaker has been placed. There is persistent elevation of the right hemidiaphragm. Pulmonary vascular congestion and diffuse interstitial opacities are similar to the prior study. No sizable pleural effusion or pneumothorax is identified. No acute osseous abnormality is seen. IMPRESSION: Unchanged bilateral interstitial densities which could reflect edema superimposed on chronic lung disease. Electronically Signed   By: Sebastian Ache M.D.   On: 11/12/2022 14:38    EKG: Atrial fibrillation rate of 55 nonspecific STTWave changes  ASSESSMENT AND PLAN:  Acute on chronic diastolic congestive  heart failure History of systolic congestive heart failure EF was 25 to 30% 2021 now improved to 55-60 History of cardiomyopathy systolic Coronary artery  disease History of permanent pacemaker COPD emphysema Atrial fibrillation Shortness of breath  Plan Agree with admit to telemetry follow-up EKGs and troponins Current chronic diastolic congestive heart failure continue current management blood pressure control diuretic therapy supplemental oxygen as needed Significant history of systolic heart failure improved recommend diuretic therapy to help with diuresis Inhalers as necessary for COPD as well as supplemental oxygen Continue anticoagulation for atrial fibrillation Permanent pacemaker in place continue telemetry consider pacemaker interrogation Consider functional study for evaluation of ischemia which was planned to be done as an outpatient Elevated BNP to 1400 consistent with significant heart failure recommend aggressive diuresis Continue statin therapy for hyperlipidemia Lipitor 80 mg daily  Signed: Alwyn Pea MD, 12/11/2022, 8:11 AM

## 2022-12-11 NOTE — Assessment & Plan Note (Signed)
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 in case cardiology decides to do procedure while in-house Cardiology consult

## 2022-12-11 NOTE — Assessment & Plan Note (Signed)
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

## 2022-12-11 NOTE — ED Provider Notes (Signed)
[facility] Provider Note    Event Date/Time   First MD Initiated Contact with Patient 12/10/22 2344     (approximate)   History   Shortness of Breath   HPI  Anne Mitchell is a 89 y.o. female whose medical history includes CHF, prior history of acute respiratory failure with hypoxia, paroxysmal atrial fibrillation on anticoagulation, apical ballooning syndrome, intermittent chest pain, emphysema, hypertension, etc.  She also recently had a cardiac pacemaker placed for a bradycardic junctional rhythm.  She presents tonight for episodic shortness of breath for the last 3 weeks associated with chest pain and back pain.  She said that it has been worse recently and she gets very short of breath particular with exertion.  However the episodes can happen while she is just at rest and they are very scary.  Her daughter is with her at bedside and confirmed this history.  The patient is unclear about her diagnoses but the daughter confirmed that she has not been told about any particular lung issues in the past but that her shortness of breath seems to be mostly associated with a heart.  The patient says she has been taking her medications.  She has not had a recent fever or cough.  No nausea or vomiting.  She gets very scared when she feels short of breath and would like to know why and how to make it stop.   Physical Exam   Triage Vital Signs: ED Triage Vitals  Enc Vitals Group     BP 12/10/22 1933 (!) 139/93     Pulse Rate 12/10/22 1933 95     Resp 12/10/22 1933 (!) 22     Temp 12/10/22 1933 97.6 F (36.4 C)     Temp Source 12/10/22 1933 Oral     SpO2 12/10/22 1933 95 %     Weight 12/10/22 1934 52 kg (114 lb 10.2 oz)     Height 12/10/22 1934 1.549 m (5\' 1" )     Head Circumference --      Peak Flow --      Pain Score 12/10/22 1932 8     Pain Loc --      Pain Edu? --      Excl. in [place]? --     Most recent vital signs: Vitals:   12/11/22 0230 12/11/22 0300   BP: (!) 125/55 122/60  Pulse:  (!) 59  Resp:  (!) 30  Temp:    SpO2: 97% 95%     General: Awake, alert, answers questions appropriately.   CV:  Good peripheral perfusion.  Regular rate and rhythm.  Pacemaker in place. Resp:  Normal effort. Speaking easily and comfortably, no accessory muscle usage nor intercostal retractions.'s are clear to auscultation with no coarse breath sounds and no wheezing. Abd:  No distention.  No tenderness to palpation of the abdomen. Other:  Patient is anxious and nervous but otherwise mood and affect are appropriate.   ED Results / Procedures / Treatments   Labs (all labs ordered are listed, but only abnormal results are displayed) Labs Reviewed  BASIC METABOLIC PANEL - Abnormal; Notable for the following components:      Result Value   Glucose, Bld 118 (*)    BUN 27 (*)    Creatinine, Ser 1.08 (*)    GFR, Estimated 49 (*)    All other components within normal limits  CBC - Abnormal; Notable for the following components:   Hemoglobin 11.3 (*)  RDW 19.1 (*)    All other components within normal limits  BRAIN NATRIURETIC PEPTIDE - Abnormal; Notable for the following components:   B Natriuretic Peptide 1,403.3 (*)    All other components within normal limits  TROPONIN I (HIGH SENSITIVITY) - Abnormal; Notable for the following components:   Troponin I (High Sensitivity) 19 (*)    All other components within normal limits  TROPONIN I (HIGH SENSITIVITY)     EKG  ED ECG REPORT I, Hinda Kehr, the attending physician, personally viewed and interpreted this ECG.  Date: 12/10/2022 EKG Time: 23: 22 Rate: 50 Rhythm: Atrial fibrillation QRS Axis: normal Intervals: Abnormal due to A-fib with premature ventricular complex, nonspecific intraventricular conduction delay ST/T Wave abnormalities: Non-specific ST segment / T-wave changes, but no clear evidence of acute ischemia. Narrative Interpretation: no definitive evidence of acute ischemia;  does not meet STEMI criteria.    RADIOLOGY I viewed and interpreted the patient's two-view chest x-ray.  She has some interstitial opacities but which appear chronic from prior.  Radiology confirms unchanged bilateral prominent interstitial opacities, infectious process versus pulmonary vascular congestion.  I also viewed and interpreted her CTA chest.  Probable pulmonary edema, no PE.  See hospital course for details.    PROCEDURES:  Critical Care performed: No  .1-3 Lead EKG Interpretation  Performed by: Hinda Kehr, MD Authorized by: Hinda Kehr, MD     Interpretation: abnormal     ECG rate:  57   ECG rate assessment: bradycardic     Rhythm: atrial fibrillation     Ectopy: none     Conduction: normal      MEDICATIONS ORDERED IN ED: Medications  amiodarone (PACERONE) tablet 200 mg (has no administration in time range)  atorvastatin (LIPITOR) tablet 80 mg (has no administration in time range)  metoprolol tartrate (LOPRESSOR) tablet 12.5 mg (has no administration in time range)  sertraline (ZOLOFT) tablet 50 mg (has no administration in time range)  apixaban (ELIQUIS) tablet 2.5 mg (has no administration in time range)  feeding supplement (ENSURE ENLIVE / ENSURE PLUS) liquid 237 mL (has no administration in time range)  cholecalciferol (VITAMIN D3) 25 MCG (1000 UNIT) tablet 2,000 Units (has no administration in time range)  calcium-vitamin D (OSCAL WITH D) 500-5 MG-MCG per tablet 1 tablet (has no administration in time range)  acetaminophen (TYLENOL) tablet 650 mg (has no administration in time range)    Or  acetaminophen (TYLENOL) suppository 650 mg (has no administration in time range)  ondansetron (ZOFRAN) tablet 4 mg (has no administration in time range)    Or  ondansetron (ZOFRAN) injection 4 mg (has no administration in time range)  HYDROcodone-acetaminophen (NORCO/VICODIN) 5-325 MG per tablet 1-2 tablet (has no administration in time range)  albuterol  (PROVENTIL) (2.5 MG/3ML) 0.083% nebulizer solution 2.5 mg (has no administration in time range)  furosemide (LASIX) injection 40 mg (has no administration in time range)  iohexol (OMNIPAQUE) 350 MG/ML injection 75 mL (75 mLs Intravenous Contrast Given 12/11/22 0246)  furosemide (LASIX) injection 40 mg (40 mg Intravenous Given 12/11/22 0354)     IMPRESSION / MDM / ASSESSMENT AND PLAN / ED COURSE  I reviewed the triage vital signs and the nursing notes.                              Differential diagnosis includes, but is not limited to, CHF exacerbation, pneumonia, COPD, PE, ACS.  Patient's presentation is most consistent  with acute presentation with potential threat to life or bodily function.  Labs/studies ordered: BMP, high-sensitivity troponin x 2, CBC, BNP, two-view chest x-ray, CTA chest PE protocol Interventions/Medications given: Furosemide 40 mg IV [facility] Course my include additional interventions or labs/studies not listed above.)  Given the patient's age and chronic medical issues, most specifically CHF, I initiated a broad workup as described above.  Her BNP is elevated at about 1400 which it has been in the past.  However clinically she is worse than she has been before.  As documented above, her chest x-ray was nondiagnostic and I obtained a CTA chest to rule out PE as well as to better evaluate her lung parenchyma.  Her labs are generally reassuring other than a very slight elevation of the troponin which is likely demand related.  As previously described her BNP is elevated but essentially steady for her.  Patient's CTA as document above is concerning for extensive pulmonary edema.  The radiologist also brought up the possibility of infection but I think that is unlikely based on the patient's symptoms.  It is much more likely she has some worsening pulmonary edema.  I spoke with her and her daughter and she is not comfortable going home and I think it is reasonable, given  the exam findings as described above, that she stay in the hospital for diuresis and additional evaluation.  I ordered furosemide 40 mg IV to start the diuresis process and I will consult the hospitalist team for admission.  The patient is on the cardiac monitor to evaluate for evidence of arrhythmia and/or significant heart rate changes.   Clinical Course as of 12/11/22 0517  Fri Dec 11, 2022  0404 Consulted and discussed case with Dr. Damita Dunnings with the hospitalist service.  She will admit [CF]    Clinical Course User Index [CF] Hinda Kehr, MD     FINAL CLINICAL IMPRESSION(S) / ED DIAGNOSES   Final diagnoses:  Acute on chronic congestive heart failure, unspecified heart failure type ([place])  Dyspnea on exertion     Rx / DC Orders   ED Discharge Orders     None        Note:  This document was prepared using Dragon voice recognition software and may include unintentional dictation errors.   Hinda Kehr, MD 12/11/22 [phone]

## 2022-12-11 NOTE — Assessment & Plan Note (Signed)
Chronic anticoagulation Continue Eliquis, amiodarone and metoprolol

## 2022-12-12 DIAGNOSIS — R0609 Other forms of dyspnea: Secondary | ICD-10-CM

## 2022-12-12 DIAGNOSIS — I509 Heart failure, unspecified: Secondary | ICD-10-CM | POA: Diagnosis not present

## 2022-12-12 DIAGNOSIS — J9621 Acute and chronic respiratory failure with hypoxia: Secondary | ICD-10-CM

## 2022-12-12 LAB — BASIC METABOLIC PANEL
Anion gap: 11 (ref 5–15)
BUN: 22 mg/dL (ref 8–23)
CO2: 26 mmol/L (ref 22–32)
Calcium: 8.5 mg/dL — ABNORMAL LOW (ref 8.9–10.3)
Chloride: 102 mmol/L (ref 98–111)
Creatinine, Ser: 1.1 mg/dL — ABNORMAL HIGH (ref 0.44–1.00)
GFR, Estimated: 48 mL/min — ABNORMAL LOW (ref >60)
Glucose, Bld: 97 mg/dL (ref 70–99)
Potassium: 3.3 mmol/L — ABNORMAL LOW (ref 3.5–5.1)
Sodium: 139 mmol/L (ref 135–145)

## 2022-12-12 LAB — CBC
HCT: 32.9 % — ABNORMAL LOW (ref 36.0–46.0)
Hemoglobin: 10.2 g/dL — ABNORMAL LOW (ref 12.0–15.0)
MCH: 29.3 pg (ref 26.0–34.0)
MCHC: 31 g/dL (ref 30.0–36.0)
MCV: 94.5 fL (ref 80.0–100.0)
Platelets: 250 10*3/uL (ref 150–400)
RBC: 3.48 MIL/uL — ABNORMAL LOW (ref 3.87–5.11)
RDW: 18.9 % — ABNORMAL HIGH (ref 11.5–15.5)
WBC: 9 10*3/uL (ref 4.0–10.5)
nRBC: 0 % (ref 0.0–0.2)

## 2022-12-12 MED ORDER — PREDNISONE 20 MG PO TABS
40.0000 mg | ORAL_TABLET | Freq: Every day | ORAL | Status: AC
  Administered 2022-12-12 – 2022-12-14 (×3): 40 mg via ORAL
  Filled 2022-12-12 (×3): qty 2

## 2022-12-12 MED ORDER — IPRATROPIUM-ALBUTEROL 0.5-2.5 (3) MG/3ML IN SOLN
3.0000 mL | RESPIRATORY_TRACT | Status: DC
  Administered 2022-12-12 (×2): 3 mL via RESPIRATORY_TRACT
  Filled 2022-12-12 (×2): qty 3

## 2022-12-12 MED ORDER — FUROSEMIDE 40 MG PO TABS
40.0000 mg | ORAL_TABLET | Freq: Every day | ORAL | Status: DC
  Administered 2022-12-12 – 2022-12-14 (×3): 40 mg via ORAL
  Filled 2022-12-12 (×3): qty 1

## 2022-12-12 MED ORDER — POTASSIUM CHLORIDE CRYS ER 20 MEQ PO TBCR
40.0000 meq | EXTENDED_RELEASE_TABLET | Freq: Two times a day (BID) | ORAL | Status: AC
  Administered 2022-12-12 (×2): 40 meq via ORAL
  Filled 2022-12-12 (×2): qty 2

## 2022-12-12 NOTE — Progress Notes (Signed)
PROGRESS NOTE  Anne Mitchell    DOB: [birth year], 89 y.o.  [id number]    Code Status: Full Code   DOA: 12/10/2022   LOS: 1   Brief hospital course  Anne Mitchell is a 89 y.o. 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  Her recent exacerbation was attributed to discontinuation of furosemide, spironolactone, Imdur and lisinopril because of hypotension.  She was restarted on furosemide and spironolactone at discharge on 3/3.  She is compliant with her meds.  Patient was last seen by her cardiologist on 3/16 at which time she continued to be symptomatic with dyspnea on exertion and intermittent chest tightness and she has an upcoming stress test on 4/17.  Her medications were continued. At the present time patient denies chest pain, cough, fever or chills ED course and data review: Afebrile, heart rate in the 50s, tachypneic in the mid [id number] and systolic blood pressure in the 120s to 130s with O2 sat in the mid 90s on room air. Troponin 19,  BNP 1404 unchanged from about 3 weeks prior.  CBC and BMP unremarkable.  EKG, personally viewed and interpreted shows A-fib at 52 with nonspecific ST-T wave changes. CTA chest negative for PE but shows bilateral airspace disease representing likely edema although multifocal pneumonia could have similar appearance as detailed below: IMPRESSION: 1. No evidence of significant pulmonary embolus. 2. Cardiac enlargement with reflux of contrast material into the hepatic veins suggesting right heart failure. 3. Diffuse bilateral airspace and interstitial infiltration in the lungs most likely representing edema although multifocal  pneumonia could also have this appearance. 4. Small bilateral pleural effusions.   Patient given a dose of Lasix 40 mg and hospitalist consulted for admission.   3/29: stress test  12/12/22 -appears euvolemic on exam but still increased WOB and SOB. 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 Euvolemic on exam. Mild wheezes on exam. Increased WOB. Patient states she is much improved since admission.  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  VTE ppx: apixaban (ELIQUIS) tablet 2.5 mg Start: 12/11/22 1000 apixaban (ELIQUIS) tablet 2.5 mg   Diet:     Diet   Diet regular Room service appropriate? Yes;  Fluid consistency: Thin   Consultants: Cardiology   Subjective 12/12/22    Pt reports no current chest pain or LE swelling. Her WOB and SOB is greatly improved since admission.   Objective   Vitals:   12/11/22 1500 12/11/22 1900 12/12/22  0000 12/12/22 0434  BP:  (!) 122/58 (!) 140/65 127/66  Pulse:  63 (!) 52 (!) 50  Resp:  (!) 21 (!) 22 18  Temp:  97.8 F (36.6 C) 98.1 F (36.7 C) 97.7 F (36.5 C)  TempSrc:  Oral Oral   SpO2:  100% 100% 100%  Weight: 53.1 kg     Height: 5' (1.524 m)      General: NAD, pleasant, able to participate in exam Cardiac: RRR, normal heart sounds, no murmurs. 2+ radial and PT pulses bilaterally Respiratory: increased effort, speaking in full sentences.mild scattered wheezes. No rales or rhonchi Extremities: no edema. WWP. Skin: warm and dry, no rashes noted Neuro: alert and oriented, no focal deficits Psych: Normal affect and mood   Intake/Output Summary (Last 24 hours) at 12/12/2022 0730 Last data filed at 12/11/2022 1919 Gross per 24 hour  Intake 1200 ml  Output --  Net 1200 ml   Filed Weights   12/10/22 1934 12/11/22 1500  Weight: 52 kg 53.1 kg    Labs   I have personally reviewed the following labs and imaging studies CBC    Component Value Date/Time   WBC 9.0 12/12/2022 0246   RBC 3.48 (L) 12/12/2022 0246   HGB 10.2 (L) 12/12/2022 0246   HCT 32.9 (L) 12/12/2022 0246   PLT 250 12/12/2022 0246   MCV 94.5 12/12/2022 0246   MCH 29.3 12/12/2022 0246   MCHC 31.0 12/12/2022 0246   RDW 18.9 (H) 12/12/2022 0246   LYMPHSABS 1.5 11/18/2022 0743   MONOABS 0.8 11/18/2022 0743   EOSABS 0.2 11/18/2022 0743   BASOSABS 0.1 11/18/2022 0743      Latest Ref Rng & Units 12/12/2022    2:46 AM 12/11/2022    3:29 PM 12/10/2022    7:49 PM  BMP  Glucose 70 - 99 mg/dL 97  120  118   BUN 8 - 23 mg/dL 22  25  27    Creatinine 0.44 - 1.00 mg/dL 1.10  1.21  1.08   Sodium 135 - 145 mmol/L 139  139  138   Potassium 3.5 - 5.1 mmol/L 3.3  4.0  4.1   Chloride 98 - 111 mmol/L 102  104  101   CO2 22 - 32 mmol/L 26  25  24    Calcium 8.9 - 10.3 mg/dL 8.5  8.8  9.3     CT Angio Chest PE W/Cm &/Or Wo Cm  Result Date: 12/11/2022 CLINICAL DATA:  Pulmonary embolus suspected with high probability.  Chest pain, back pain, and shortness of breath for 3 weeks. EXAM: CT ANGIOGRAPHY CHEST WITH CONTRAST TECHNIQUE: Multidetector CT imaging of the chest was performed using the standard protocol during bolus administration of intravenous contrast. Multiplanar CT image reconstructions and MIPs were obtained to evaluate the vascular anatomy. RADIATION DOSE REDUCTION: This exam was performed according to the departmental dose-optimization program which includes automated exposure control, adjustment of the mA and/or kV according to patient size and/or use of iterative reconstruction technique. CONTRAST:  74mL OMNIPAQUE IOHEXOL 350 MG/ML SOLN COMPARISON:  11/01/2022 FINDINGS: Cardiovascular: Good opacification of the central and segmental pulmonary arteries. No focal filling defects. No evidence of significant pulmonary embolus. Cardiac enlargement. Reflux of contrast material into the  hepatic veins suggesting right heart failure. Normal caliber thoracic aorta. Calcification of the aorta and coronary arteries. Mediastinum/Nodes: Esophagus is decompressed. Mediastinal lymph nodes are moderately prominent without pathologic enlargement, likely reactive. No change since prior study. Thyroid gland is unremarkable. Lungs/Pleura: Small bilateral pleural effusions. Diffuse interstitial and alveolar infiltrates throughout the lungs, progressing since prior study. This likely represents pulmonary edema although multifocal pneumonia could also have this appearance. Airways are clear. Upper Abdomen: No acute abnormalities. Musculoskeletal: Degenerative changes in the spine. Review of the MIP images confirms the above findings. IMPRESSION: 1. No evidence of significant pulmonary embolus. 2. Cardiac enlargement with reflux of contrast material into the hepatic veins suggesting right heart failure. 3. Diffuse bilateral airspace and interstitial infiltration in the lungs most likely representing edema although multifocal pneumonia could  also have this appearance. 4. Small bilateral pleural effusions. Electronically Signed   By: Lucienne Capers M.D.   On: 12/11/2022 03:15   DG Chest 2 View  Result Date: 12/10/2022 CLINICAL DATA:  Shortness of breath EXAM: CHEST - 2 VIEW COMPARISON:  CXR 11/18/22 FINDINGS: Loop recorder device projects over the left hemithorax. No pleural effusion. No pneumothorax. Unchanged asymmetric elevation of the right hemidiaphragm. Surgical clips in the right axilla. Unchanged cardiac and mediastinal contours. Focal opacity. Unchanged prominent bilateral interstitial opacities, which could represent pulmonary venous congestion or atypical infection. Visualized upper unremarkable. No radiographically apparent displaced rib fractures. IMPRESSION: Unchanged prominent bilateral interstitial opacities, which could represent pulmonary venous congestion or atypical infection. Electronically Signed   By: Marin Roberts M.D.   On: 12/10/2022 20:17    Disposition Plan & Communication  Patient status: Inpatient  Admitted From: Home Planned disposition location: Home Anticipated discharge date: 3/31 pending respiratory status improvement  Family Communication: none at bedside.    Author: Richarda Osmond, DO [facility] 12/12/2022, 7:30 AM   Available by Epic secure chat 7AM-7PM. If 7PM-7AM, please contact night-coverage.  [facility] contact information found on [email or web address].

## 2022-12-12 NOTE — Progress Notes (Addendum)
[facility] Cardiology    SUBJECTIVE: Patient feeling much be with improved SOB. No significant leg edema.    Vitals:   12/12/22 0000 12/12/22 0434 12/12/22 0734 12/12/22 1122  BP: (!) 140/65 127/66 120/63 132/68  Pulse: (!) 52 (!) 50 (!) 57 (!) 54  Resp: (!) 22 18 20 18   Temp: 98.1 F (36.7 C) 97.7 F (36.5 C) (!) 97.4 F (36.3 C) (!) 97.5 F (36.4 C)  TempSrc: Oral     SpO2: 100% 100% 99% 100%  Weight:      Height:         Intake/Output Summary (Last 24 hours) at 12/12/2022 1124 Last data filed at 12/12/2022 1121 Gross per 24 hour  Intake 1440 ml  Output 450 ml  Net 990 ml      PHYSICAL EXAM  General: Well developed, well nourished, in no acute distress HEENT:  Normocephalic and atramatic Neck:  No JVD.  Lungs: Diminished breath sounds bilaterally to auscultation and percussion. Heart: HRRR . Normal S1 and S2 without gallops or murmurs.  Abdomen: Bowel sounds are positive, abdomen soft and non-tender  Msk:  Back normal, normal gait. Normal strength and tone for age. Extremities: No clubbing, cyanosis or 2+edema.   Neuro: Alert and oriented X 3. Psych:  Good affect, responds appropriately   LABS: Basic Metabolic Panel: Recent Labs    12/11/22 1529 12/12/22 0246  NA 139 139  K 4.0 3.3*  CL 104 102  CO2 25 26  GLUCOSE 120* 97  BUN 25* 22  CREATININE 1.21* 1.10*  CALCIUM 8.8* 8.5*   Liver Function Tests: No results for input(s): "AST", "ALT", "ALKPHOS", "BILITOT", "PROT", "ALBUMIN" in the last 72 hours. No results for input(s): "LIPASE", "AMYLASE" in the last 72 hours. CBC: Recent Labs    12/10/22 1949 12/12/22 0246  WBC 9.8 9.0  HGB 11.3* 10.2*  HCT 37.2 32.9*  MCV 95.9 94.5  PLT 280 250   Cardiac Enzymes: No results for input(s): "CKTOTAL", "CKMB", "CKMBINDEX", "TROPONINI" in the last 72 hours. BNP: Invalid input(s): "POCBNP" D-Dimer: No results for input(s): "DDIMER" in the last 72 hours. Hemoglobin A1C: No results for input(s): "HGBA1C" in  the last 72 hours. Fasting Lipid Panel: No results for input(s): "CHOL", "HDL", "LDLCALC", "TRIG", "CHOLHDL", "LDLDIRECT" in the last 72 hours. Thyroid Function Tests: No results for input(s): "TSH", "T4TOTAL", "T3FREE", "THYROIDAB" in the last 72 hours.  Invalid input(s): "FREET3" Anemia Panel: No results for input(s): "VITAMINB12", "FOLATE", "FERRITIN", "TIBC", "IRON", "RETICCTPCT" in the last 72 hours.  CT Angio Chest PE W/Cm &/Or Wo Cm  Result Date: 12/11/2022 CLINICAL DATA:  Pulmonary embolus suspected with high probability. Chest pain, back pain, and shortness of breath for 3 weeks. EXAM: CT ANGIOGRAPHY CHEST WITH CONTRAST TECHNIQUE: Multidetector CT imaging of the chest was performed using the standard protocol during bolus administration of intravenous contrast. Multiplanar CT image reconstructions and MIPs were obtained to evaluate the vascular anatomy. RADIATION DOSE REDUCTION: This exam was performed according to the departmental dose-optimization program which includes automated exposure control, adjustment of the mA and/or kV according to patient size and/or use of iterative reconstruction technique. CONTRAST:  65mL OMNIPAQUE IOHEXOL 350 MG/ML SOLN COMPARISON:  11/01/2022 FINDINGS: Cardiovascular: Good opacification of the central and segmental pulmonary arteries. No focal filling defects. No evidence of significant pulmonary embolus. Cardiac enlargement. Reflux of contrast material into the hepatic veins suggesting right heart failure. Normal caliber thoracic aorta. Calcification of the aorta and coronary arteries. Mediastinum/Nodes: Esophagus is decompressed. Mediastinal lymph nodes  are moderately prominent without pathologic enlargement, likely reactive. No change since prior study. Thyroid gland is unremarkable. Lungs/Pleura: Small bilateral pleural effusions. Diffuse interstitial and alveolar infiltrates throughout the lungs, progressing since prior study. This likely represents  pulmonary edema although multifocal pneumonia could also have this appearance. Airways are clear. Upper Abdomen: No acute abnormalities. Musculoskeletal: Degenerative changes in the spine. Review of the MIP images confirms the above findings. IMPRESSION: 1. No evidence of significant pulmonary embolus. 2. Cardiac enlargement with reflux of contrast material into the hepatic veins suggesting right heart failure. 3. Diffuse bilateral airspace and interstitial infiltration in the lungs most likely representing edema although multifocal pneumonia could also have this appearance. 4. Small bilateral pleural effusions. Electronically Signed   By: Burman Nieves M.D.   On: 12/11/2022 03:15   DG Chest 2 View  Result Date: 12/10/2022 CLINICAL DATA:  Shortness of breath EXAM: CHEST - 2 VIEW COMPARISON:  CXR 11/18/22 FINDINGS: Loop recorder device projects over the left hemithorax. No pleural effusion. No pneumothorax. Unchanged asymmetric elevation of the right hemidiaphragm. Surgical clips in the right axilla. Unchanged cardiac and mediastinal contours. Focal opacity. Unchanged prominent bilateral interstitial opacities, which could represent pulmonary venous congestion or atypical infection. Visualized upper unremarkable. No radiographically apparent displaced rib fractures. IMPRESSION: Unchanged prominent bilateral interstitial opacities, which could represent pulmonary venous congestion or atypical infection. Electronically Signed   By: Lorenza Cambridge M.D.   On: 12/10/2022 20:17     Echo Normal LVF EF=55-60%   TELEMETRY: AFIB 60 nsstw:  ASSESSMENT AND PLAN:  Active Problems:   Essential hypertension   AF (paroxysmal atrial fibrillation) (HCC)   Acute on chronic diastolic CHF (congestive heart failure) (HCC)   Chronic anticoagulation   Emphysema lung (HCC)   Coronary artery disease   S/P placement of cardiac pacemaker History of systolic heart failure  Plan Systolic congestive heart failure improved  after modest diuretic therapy continue current management Chronic diastolic congestive heart failure recommend continued aggressive medical therapy blood pressure control and diuresis Inhalers as necessary for COPD Continue anticoagulation for atrial fibrillation Pacemaker appears to be functioning adequately have the patient follow-up with pacer clinic with interrogation Hyperlipidemia stable , recommend continued lipitor for lipid management Increase activity with ambulate in the halls     Alwyn Pea, MD 12/12/2022 11:24 AM

## 2022-12-13 DIAGNOSIS — I509 Heart failure, unspecified: Secondary | ICD-10-CM | POA: Diagnosis not present

## 2022-12-13 DIAGNOSIS — R0609 Other forms of dyspnea: Secondary | ICD-10-CM | POA: Diagnosis not present

## 2022-12-13 MED ORDER — IPRATROPIUM-ALBUTEROL 0.5-2.5 (3) MG/3ML IN SOLN
3.0000 mL | Freq: Three times a day (TID) | RESPIRATORY_TRACT | Status: DC
  Administered 2022-12-13: 3 mL via RESPIRATORY_TRACT
  Filled 2022-12-13: qty 3

## 2022-12-13 MED ORDER — IPRATROPIUM-ALBUTEROL 0.5-2.5 (3) MG/3ML IN SOLN
3.0000 mL | Freq: Two times a day (BID) | RESPIRATORY_TRACT | Status: DC
  Administered 2022-12-13 – 2022-12-15 (×4): 3 mL via RESPIRATORY_TRACT
  Filled 2022-12-13 (×4): qty 3

## 2022-12-13 NOTE — Progress Notes (Signed)
PROGRESS NOTE  Anne Mitchell    DOB: 1931-09-28, 89 y.o.  [id number]    Code Status: Full Code   DOA: 12/10/2022   LOS: 2   Brief hospital course  Anne Mitchell is a 89 y.o. 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  Her recent exacerbation was attributed to discontinuation of furosemide, spironolactone, Imdur and lisinopril because of hypotension.  She was restarted on furosemide and spironolactone at discharge on 3/3.  She is compliant with her meds.  Patient was last seen by her cardiologist on 3/16 at which time she continued to be symptomatic with dyspnea on exertion and intermittent chest tightness and she has an upcoming stress test on 4/17.  Her medications were continued. At the present time patient denies chest pain, cough, fever or chills ED course and data review: Afebrile, heart rate in the 50s, tachypneic in the mid Q000111Q and systolic blood pressure in the 120s to 130s with O2 sat in the mid 90s on room air. Troponin 19,  BNP 1404 unchanged from about 3 weeks prior.  CBC and BMP unremarkable.  EKG, personally viewed and interpreted shows A-fib at 52 with nonspecific ST-T wave changes. CTA chest negative for PE but shows bilateral airspace disease representing likely edema although multifocal pneumonia could have similar appearance as detailed below: IMPRESSION: 1. No evidence of significant pulmonary embolus. 2. Cardiac enlargement with reflux of contrast material into the hepatic veins suggesting right heart failure. 3. Diffuse bilateral airspace and interstitial infiltration in the lungs most likely representing edema although multifocal  pneumonia could also have this appearance. 4. Small bilateral pleural effusions.   Patient given a dose of Lasix 40 mg and hospitalist consulted for admission.   3/29: stress test  3/30-03/31/24 -appears euvolemic on exam but still increased WOB and SOB. Appears to be more of a COPD exacerbation than CHF exacerbation to me at this point. Slowly weaning oxygen and improving activity tolerance. Want to her to try to ambulate today and continue breathing treatments, diuresis. She may need to be discharged with oxygen if acute on chronic respiratory failure picture.  Assessment & Plan  Active Problems:   Acute on chronic diastolic CHF (congestive heart failure) (HCC)   Coronary artery disease   Essential hypertension   AF (paroxysmal atrial fibrillation) (HCC)   Chronic anticoagulation   Emphysema lung (HCC)   S/P placement of cardiac pacemaker   Acute on chronic congestive heart failure (HCC)   Dyspnea on exertion   Acute on chronic respiratory failure with hypoxia (HCC)  HFpEF- recent echo in February with EF 50 to 55% and G1 DD History of stress cardiomyopathy with a EF 25 to 30% - cardiology following, appreciate your recs - IV Lasix transitioned to PO - Continue metoprolol - Daily weights with intake and output monitoring  Emphysema lung (HCC) Acute hypoxic respiratory failure- improved to 2L O2. Euvolemic on exam. Mild wheezes on exam. Conversational dyspnea but able to speak in multiple full sentences. Patient states she is much improved since admission.  - scheduled duonebs - prednisone - Albuterol as needed - pulse ox with ambulation   Coronary artery disease Nonobstructive CAD on cardiac cath in 2021 Continue apixaban and  atorvastatin and metoprolol - stress test 3/29   S/P placement of cardiac pacemaker Pacemaker was placed for junctional bradycardia 10/2022    AF (paroxysmal atrial fibrillation) (HCC) Chronic anticoagulation Continue Eliquis, amiodarone and  metoprolol  Body mass index is 21.68 kg/m.  VTE ppx: apixaban (ELIQUIS) tablet 2.5 mg Start: 12/11/22 1000 apixaban (ELIQUIS) tablet 2.5 mg   Diet:     Diet   Diet regular Room service appropriate? Yes; Fluid consistency: Thin   Consultants: Cardiology   Subjective 12/13/22    Pt reports no current chest pain or LE swelling. Her WOB and SOB is greatly improved since admission. Has not been up walking yet but feels up to it today.    Objective   Vitals:   12/12/22 1927 12/12/22 2005 12/12/22 2307 12/13/22 0431  BP: (!) 109/50  118/60 133/76  Pulse: (!) 56  (!) 58 (!) 56  Resp: 20  18 20   Temp: 98.2 F (36.8 C)  97.6 F (36.4 C) 98.4 F (36.9 C)  TempSrc:    Oral  SpO2: 99% 99% 99% 100%  Weight:      Height:       General: NAD, pleasant, able to participate in exam Cardiac: RRR, normal heart sounds, no murmurs. 2+ radial and PT pulses bilaterally Respiratory: normal effort, speaking in full sentences.mild scattered wheezes. No rales or rhonchi Extremities: no edema. WWP. Skin: warm and dry, no rashes noted Neuro: alert and oriented, no focal deficits Psych: Normal affect and mood   Intake/Output Summary (Last 24 hours) at 12/13/2022 0727 Last data filed at 12/12/2022 1800 Gross per 24 hour  Intake 960 ml  Output 450 ml  Net 510 ml    Filed Weights   12/10/22 1934 12/11/22 1500 12/12/22 0707  Weight: 52 kg 53.1 kg 50.3 kg    Labs   I have personally reviewed the following labs and imaging studies CBC    Component Value Date/Time   WBC 9.0 12/12/2022 0246   RBC 3.48 (L) 12/12/2022 0246   HGB 10.2 (L) 12/12/2022 0246   HCT 32.9 (L) 12/12/2022 0246   PLT 250 12/12/2022 0246   MCV 94.5 12/12/2022 0246   MCH 29.3 12/12/2022 0246   MCHC 31.0 12/12/2022 0246   RDW 18.9 (H) 12/12/2022 0246   LYMPHSABS 1.5 11/18/2022 0743   MONOABS 0.8 11/18/2022 0743   EOSABS 0.2 11/18/2022 0743   BASOSABS 0.1 11/18/2022 0743      Latest Ref Rng & Units 12/12/2022     2:46 AM 12/11/2022    3:29 PM 12/10/2022    7:49 PM  BMP  Glucose 70 - 99 mg/dL 97  120  118   BUN 8 - 23 mg/dL 22  25  27    Creatinine 0.44 - 1.00 mg/dL 1.10  1.21  1.08   Sodium 135 - 145 mmol/L 139  139  138   Potassium 3.5 - 5.1 mmol/L 3.3  4.0  4.1   Chloride 98 - 111 mmol/L 102  104  101   CO2 22 - 32 mmol/L 26  25  24    Calcium 8.9 - 10.3 mg/dL 8.5  8.8  9.3    Disposition Plan & Communication  Patient status: Inpatient  Admitted From: Home Planned disposition location: Home Anticipated discharge date: 4/1 pending respiratory status improvement  Family Communication: none at bedside.    Author: Richarda Osmond, DO [facility] 12/13/2022, 7:27 AM   Available by Epic secure chat 7AM-7PM. If 7PM-7AM, please contact night-coverage.  [facility] contact information found on [email or web address].

## 2022-12-13 NOTE — Progress Notes (Signed)
   12/13/22 1200  Spiritual Encounters  Type of Visit Initial  Care provided to: Patient  Referral source Nurse (RN/NT/LPN)  Reason for visit Advance directives  OnCall Visit Yes   Chaplain provided and explained paperwork for AD.

## 2022-12-13 NOTE — Progress Notes (Signed)
[facility] Cardiology    SUBJECTIVE: Patient appears to be stable improved shortness of breath improved leg edema no pain resting comfortable   Vitals:   12/13/22 0800 12/13/22 0816 12/13/22 1114 12/13/22 1115  BP: 123/62   138/64  Pulse: (!) 57   (!) 57  Resp: 20   20  Temp: 98.6 F (37 C)   98.6 F (37 C)  TempSrc: Oral   Oral  SpO2: 98% 96% (!) 88% 96%  Weight:      Height:         Intake/Output Summary (Last 24 hours) at 12/13/2022 1323 Last data filed at 12/13/2022 1028 Gross per 24 hour  Intake 720 ml  Output --  Net 720 ml      PHYSICAL EXAM  General: Well developed, well nourished, in no acute distress HEENT:  Normocephalic and atramatic Neck:  No JVD.  Lungs: Clear bilaterally to auscultation and percussion. Heart: HRRR . Normal S1 and S2 without gallops or murmurs.  Abdomen: Bowel sounds are positive, abdomen soft and non-tender  Msk:  Back normal, normal gait. Normal strength and tone for age. Extremities: No clubbing, cyanosis or edema.   Neuro: Alert and oriented X 3. Psych:  Good affect, responds appropriately   LABS: Basic Metabolic Panel: Recent Labs    12/11/22 1529 12/12/22 0246  NA 139 139  K 4.0 3.3*  CL 104 102  CO2 25 26  GLUCOSE 120* 97  BUN 25* 22  CREATININE 1.21* 1.10*  CALCIUM 8.8* 8.5*   Liver Function Tests: No results for input(s): "AST", "ALT", "ALKPHOS", "BILITOT", "PROT", "ALBUMIN" in the last 72 hours. No results for input(s): "LIPASE", "AMYLASE" in the last 72 hours. CBC: Recent Labs    12/10/22 1949 12/12/22 0246  WBC 9.8 9.0  HGB 11.3* 10.2*  HCT 37.2 32.9*  MCV 95.9 94.5  PLT 280 250   Cardiac Enzymes: No results for input(s): "CKTOTAL", "CKMB", "CKMBINDEX", "TROPONINI" in the last 72 hours. BNP: Invalid input(s): "POCBNP" D-Dimer: No results for input(s): "DDIMER" in the last 72 hours. Hemoglobin A1C: No results for input(s): "HGBA1C" in the last 72 hours. Fasting Lipid Panel: No results for input(s):  "CHOL", "HDL", "LDLCALC", "TRIG", "CHOLHDL", "LDLDIRECT" in the last 72 hours. Thyroid Function Tests: No results for input(s): "TSH", "T4TOTAL", "T3FREE", "THYROIDAB" in the last 72 hours.  Invalid input(s): "FREET3" Anemia Panel: No results for input(s): "VITAMINB12", "FOLATE", "FERRITIN", "TIBC", "IRON", "RETICCTPCT" in the last 72 hours.  No results found.   Echo preserved left ventricular function of 55  TELEMETRY: Atrial fibrillation low voltage nonspecific ST-T changes rate of 65: Myoview evidence of ischemia preserved left ventricular ASSESSMENT AND PLAN:  Active Problems:   Essential hypertension   AF (paroxysmal atrial fibrillation) (HCC)   Acute on chronic diastolic CHF (congestive heart failure) (HCC)   Chronic anticoagulation   Emphysema lung (HCC)   Coronary artery disease   S/P placement of cardiac pacemaker   Acute on chronic congestive heart failure (HCC)   Dyspnea on exertion   Acute on chronic respiratory failure with hypoxia (HCC)    Plan Acute on chronic diastolic congestive heart failure with preserved left ventricular function continue diuresis supplemental oxygen as necessary Hypertension reasonably controlled continue current management Anticoagulation for atrial fibrillation continue current therapy with Eliquis Amiodarone p.o. therapy for rhythm control for atrial fibrillation Coronary disease reasonably stable nonobstructive continue conservative management Eliquis statin metoprolol COPD continue inhalers as necessary to help with shortness of breath and dyspnea Permanent pacemaker in place  functioning unremarkably Continue medical therapy  Yolonda Kida, MD, 12/13/2022 1:23 PM

## 2022-12-14 DIAGNOSIS — J441 Chronic obstructive pulmonary disease with (acute) exacerbation: Secondary | ICD-10-CM | POA: Diagnosis not present

## 2022-12-14 DIAGNOSIS — R0609 Other forms of dyspnea: Secondary | ICD-10-CM | POA: Diagnosis not present

## 2022-12-14 DIAGNOSIS — I509 Heart failure, unspecified: Secondary | ICD-10-CM | POA: Diagnosis not present

## 2022-12-14 LAB — NM MYOCAR MULTI W/SPECT W/WALL MOTION / EF
Base ST Depression (mm): 0 mm
LV dias vol: 36 mL (ref 46–106)
LV sys vol: 16 mL
Nuc Stress EF: 56 %
Rest Nuclear Isotope Dose: 10.5 mCi
SDS: 0
SRS: 0
SSS: 1
ST Depression (mm): 0 mm
Stress Nuclear Isotope Dose: 31.4 mCi
TID: 0.7

## 2022-12-14 LAB — BASIC METABOLIC PANEL
Anion gap: 9 (ref 5–15)
BUN: 32 mg/dL — ABNORMAL HIGH (ref 8–23)
CO2: 25 mmol/L (ref 22–32)
Calcium: 9 mg/dL (ref 8.9–10.3)
Chloride: 102 mmol/L (ref 98–111)
Creatinine, Ser: 1.14 mg/dL — ABNORMAL HIGH (ref 0.44–1.00)
GFR, Estimated: 46 mL/min — ABNORMAL LOW (ref >60)
Glucose, Bld: 179 mg/dL — ABNORMAL HIGH (ref 70–99)
Potassium: 4.7 mmol/L (ref 3.5–5.1)
Sodium: 136 mmol/L (ref 135–145)

## 2022-12-14 MED ORDER — MELATONIN 5 MG PO TABS
5.0000 mg | ORAL_TABLET | Freq: Once | ORAL | Status: AC
  Administered 2022-12-14: 5 mg via ORAL
  Filled 2022-12-14: qty 1

## 2022-12-14 MED ORDER — FUROSEMIDE 40 MG PO TABS
60.0000 mg | ORAL_TABLET | Freq: Every day | ORAL | Status: DC
  Administered 2022-12-15: 60 mg via ORAL
  Filled 2022-12-14: qty 1

## 2022-12-14 MED ORDER — BUDESONIDE 0.25 MG/2ML IN SUSP
0.2500 mg | Freq: Two times a day (BID) | RESPIRATORY_TRACT | Status: DC
  Administered 2022-12-14 – 2022-12-15 (×3): 0.25 mg via RESPIRATORY_TRACT
  Filled 2022-12-14 (×3): qty 2

## 2022-12-14 NOTE — Progress Notes (Signed)
PROGRESS NOTE  Anne Mitchell    DOB: 20-Dec-1931, 89 y.o.  [id number]    Code Status: Full Code   DOA: 12/10/2022   LOS: 3   Brief hospital course  Anne Mitchell is a 89 y.o. 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  Her recent exacerbation was attributed to discontinuation of furosemide, spironolactone, Imdur and lisinopril because of hypotension.  She was restarted on furosemide and spironolactone at discharge on 3/3.  She is compliant with her meds.  Patient was last seen by her cardiologist on 3/16 at which time she continued to be symptomatic with dyspnea on exertion and intermittent chest tightness and she has an upcoming stress test on 4/17.  Her medications were continued. At the present time patient denies chest pain, cough, fever or chills ED course and data review: Afebrile, heart rate in the 50s, tachypneic in the mid Q000111Q and systolic blood pressure in the 120s to 130s with O2 sat in the mid 90s on room air. Troponin 19,  BNP 1404 unchanged from about 3 weeks prior.  CBC and BMP unremarkable.  EKG, personally viewed and interpreted shows A-fib at 52 with nonspecific ST-T wave changes. CTA chest negative for PE but shows bilateral airspace disease representing likely edema although multifocal pneumonia could have similar appearance as detailed below: IMPRESSION: 1. No evidence of significant pulmonary embolus. 2. Cardiac enlargement with reflux of contrast material into the hepatic veins suggesting right heart failure. 3. Diffuse bilateral airspace and interstitial infiltration in the lungs most likely representing edema although multifocal  pneumonia could also have this appearance. 4. Small bilateral pleural effusions.   Patient given a dose of Lasix 40 mg and hospitalist consulted for admission.   3/29: stress test- non ischemic 3/30-3/31: continues to have gradual improvement in respiratory status with breathing treatments and diuresis.  12/14/22 -was on room air this morning and placed back on 2L with her breathing treatment. Doing much better clinically. Mild hypoxia with exertion.   Assessment & Plan  Active Problems:   Acute on chronic diastolic CHF (congestive heart failure)   Coronary artery disease   Essential hypertension   AF (paroxysmal atrial fibrillation)   Chronic anticoagulation   Emphysema lung   S/P placement of cardiac pacemaker   Acute on chronic congestive heart failure   Dyspnea on exertion   Acute on chronic respiratory failure with hypoxia  HFpEF- recent echo in February with EF 50 to 55% and G1 DD History of stress cardiomyopathy with a EF 25 to 30% - cardiology following, appreciate your recs - IV Lasix transitioned to PO - Continue metoprolol - Daily weights with intake and output monitoring  Emphysema lung  Acute hypoxic respiratory failure- improved to room air at rest. Euvolemic on exam. No wheezes on exam. No conversational dyspnea. Patient states she is much improved since admission. Desaturating with ambulation. Suspect will discharge with O2 support.  - scheduled duonebs - starting ICS, maybe dc with symbicort or similar inhalers for maintenance - prednisone - Albuterol as needed - pulse ox with ambulation daily while requiring O2 - PT/OT evaluation per family request to evaluate for SNF - TOC engaged for HH/SNF planning and DME   Coronary  artery disease Nonobstructive CAD on cardiac cath in 2021 Continue apixaban and atorvastatin and metoprolol - stress test 3/29- non-obstructive   S/P placement of cardiac pacemaker Pacemaker was placed for junctional bradycardia 10/2022     AF (paroxysmal atrial fibrillation) (HCC) Chronic anticoagulation Continue Eliquis, amiodarone and metoprolol  Body mass index is 21.61 kg/m.  VTE ppx: apixaban (ELIQUIS) tablet 2.5 mg Start: 12/11/22 1000 apixaban (ELIQUIS) tablet 2.5 mg   Diet:     Diet   Diet regular Room service appropriate? Yes; Fluid consistency: Thin   Consultants: Cardiology   Subjective 12/14/22    Pt reports no current chest pain or LE swelling. Her WOB and SOB is greatly improved since admission. Feels no SOB when ambulating to restroom.    Objective   Vitals:   12/13/22 1808 12/13/22 2129 12/14/22 0352 12/14/22 0544  BP: (!) 118/57 129/69  (!) 142/74  Pulse: (!) 54 (!) 49  61  Resp: 18 19  18   Temp: 97.9 F (36.6 C) 97.6 F (36.4 C)  98.7 F (37.1 C)  TempSrc:  Oral    SpO2: 99% 99%  92%  Weight:   50.2 kg   Height:       General: NAD, pleasant, able to participate in exam Cardiac: RRR, normal heart sounds, no murmurs. 2+ radial and PT pulses bilaterally Respiratory: normal effort, speaking in full sentences.mild scattered wheezes. No rales or rhonchi Extremities: no edema. WWP. Skin: warm and dry, no rashes noted Neuro: alert and oriented, no focal deficits Psych: Normal affect and mood   Intake/Output Summary (Last 24 hours) at 12/14/2022 0722 Last data filed at 12/13/2022 1028 Gross per 24 hour  Intake 240 ml  Output --  Net 240 ml    Filed Weights   12/11/22 1500 12/12/22 0707 12/14/22 0352  Weight: 53.1 kg 50.3 kg 50.2 kg    Labs   I have personally reviewed the following labs and imaging studies CBC    Component Value Date/Time   WBC 9.0 12/12/2022 0246   RBC 3.48 (L) 12/12/2022 0246   HGB 10.2 (L) 12/12/2022 0246   HCT 32.9 (L) 12/12/2022 0246   PLT 250 12/12/2022 0246   MCV 94.5 12/12/2022 0246   MCH 29.3 12/12/2022 0246   MCHC 31.0 12/12/2022 0246   RDW 18.9 (H) 12/12/2022 0246   LYMPHSABS 1.5 11/18/2022 0743   MONOABS 0.8 11/18/2022 0743   EOSABS 0.2  11/18/2022 0743   BASOSABS 0.1 11/18/2022 0743      Latest Ref Rng & Units 12/14/2022    4:38 AM 12/12/2022    2:46 AM 12/11/2022    3:29 PM  BMP  Glucose 70 - 99 mg/dL 179  97  120   BUN 8 - 23 mg/dL 32  22  25   Creatinine 0.44 - 1.00 mg/dL 1.14  1.10  1.21   Sodium 135 - 145 mmol/L 136  139  139   Potassium 3.5 - 5.1 mmol/L 4.7  3.3  4.0   Chloride 98 - 111 mmol/L 102  102  104   CO2 22 - 32 mmol/L 25  26  25    Calcium 8.9 - 10.3 mg/dL 9.0  8.5  8.8    Disposition Plan & Communication  Patient status: Inpatient  Admitted From: Home Planned disposition location: Home Anticipated discharge date: 4/2 pending respiratory status improvement  Family Communication: daughter on phone    Author: Richarda Osmond, DO [facility] 12/14/2022, 7:22 AM   Available by [employer]  secure chat 7AM-7PM. If 7PM-7AM, please contact night-coverage.  [facility] contact information found on [email or web address].

## 2022-12-14 NOTE — Progress Notes (Signed)
SATURATION QUALIFICATIONS: (This note is used to comply with regulatory documentation for home oxygen)  Patient Saturations on Room Air at Rest = 92%  Patient Saturations on Room Air while Ambulating = 86%  Patient Saturations on 2 Liters of oxygen while Ambulating = 92%  

## 2022-12-14 NOTE — Evaluation (Signed)
Occupational Therapy Evaluation Patient Details Name: Anne Mitchell MRN: [id number] DOB: 11-26-1931 Today's Date: 12/14/2022   History of Present Illness Anne Mitchell is a 89 y.o. 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   Clinical Impression   Anne Mitchell was seen for OT evaluation this date. Prior to hospital admission, pt was MOD I. Pt presents to acute OT demonstrating impaired ADL performance and functional mobility 2/2 decreased activity tolerance. Pt currently requires SBA standing grooming task, pt fatigues quickly and completes sitting. SpO2 90s on 2L [place], reports increased WOB. Pt would benefit from skilled OT to review ECS in setting of I/ADLs, recommend no follow up therapy.   Recommendations for follow up therapy are one component of a multi-disciplinary discharge planning process, led by the attending physician.  Recommendations may be updated based on patient status, additional functional criteria and insurance authorization.   Assistance Recommended at Discharge Intermittent Supervision/Assistance  Patient can return home with the following A little help with walking and/or transfers;A little help with bathing/dressing/bathroom;Help with stairs or ramp for entrance    Functional Status Assessment  Patient has had a recent decline in their functional status and demonstrates the ability to make significant improvements in function in a reasonable and predictable amount of time.  Equipment Recommendations  None recommended by OT    Recommendations for Other Services       Precautions / Restrictions Precautions Precautions:  Fall Restrictions Weight Bearing Restrictions: No      Mobility Bed Mobility Overal bed mobility: Independent                  Transfers Overall transfer level: Needs assistance Equipment used: None Transfers: Sit to/from Stand Sit to Stand: Supervision                  Balance Overall balance assessment: Needs assistance Sitting-balance support: No upper extremity supported, Feet supported Sitting balance-Leahy Scale: Good     Standing balance support: Single extremity supported, During functional activity Standing balance-Leahy Scale: Fair                             ADL either performed or assessed with clinical judgement   ADL Overall ADL's : Needs assistance/impaired                                       General ADL Comments: SBA standing grooming task, pt fatigues quickly and completes sitting. MOD I don B slippers seated EOB.      Pertinent Vitals/Pain Pain Assessment Pain Assessment: No/denies pain     Hand Dominance     Extremity/Trunk Assessment Upper Extremity Assessment Upper Extremity Assessment: Overall WFL for tasks assessed   Lower Extremity Assessment Lower Extremity Assessment: Generalized weakness   Cervical / Trunk Assessment Cervical / Trunk Assessment: Normal   Communication Communication Communication: HOH   Cognition Arousal/Alertness: Awake/alert Behavior During Therapy: WFL for tasks assessed/performed Overall Cognitive Status: Within Functional Limits for tasks assessed  Home Living Family/patient expects to be discharged to:: Private residence Living Arrangements: Children Available Help at Discharge: Family;Available PRN/intermittently Type of Home: House Home Access: Stairs to enter [employer] of Steps: 3 Entrance Stairs-Rails: Left Home Layout: One level     Bathroom Shower/Tub: Arts administrator: Handicapped height Bathroom Accessibility: Yes   Home Equipment: Rollator (4 wheels)          Prior Functioning/Environment Prior Level of Function : Independent/Modified Independent             Mobility Comments: uses rollator at baseline ADLs Comments: Daughter assists with meal prep        OT Problem List: Decreased strength;Decreased activity tolerance      OT Treatment/Interventions: Self-care/ADL training;Therapeutic exercise;Energy conservation;DME and/or AE instruction;Therapeutic activities;Balance training;Patient/family education    OT Goals(Current goals can be found in the care plan section) Acute Rehab OT Goals Patient Stated Goal: to go home OT Goal Formulation: With patient Time For Goal Achievement: 12/28/22 Potential to Achieve Goals: Good ADL Goals Pt Will Perform Grooming: Independently;standing Pt Will Perform Lower Body Dressing: Independently;sit to/from stand Pt Will Transfer to Toilet: ambulating;regular height toilet;with modified independence  OT Frequency: Min 2X/week    Co-evaluation              AM-PAC OT "6 Clicks" Daily Activity     Outcome Measure Help from another person eating meals?: None Help from another person taking care of personal grooming?: A Little Help from another person toileting, which includes using toliet, bedpan, or urinal?: A Little Help from another person bathing (including washing, rinsing, drying)?: A Little Help from another person to put on and taking off regular upper body clothing?: None Help from another person to put on and taking off regular lower body clothing?: A Little 6 Click Score: 20   End of Session    Activity Tolerance: Patient tolerated treatment well Patient left: in bed;with call bell/phone within reach  OT Visit Diagnosis: Other abnormalities of gait and mobility (R26.89)                Time: [id number] OT Time Calculation (min): 10 min Charges:  OT  General Charges $OT Visit: 1 Visit OT Evaluation $OT Eval Low Complexity: 1 Low  Dessie Coma, M.S. OTR/L  12/14/22, 4:14 PM  ascom [phone]

## 2022-12-14 NOTE — Progress Notes (Signed)
SATURATION QUALIFICATIONS: (This note is used to comply with regulatory documentation for home oxygen)  Patient Saturations on Room Air while Ambulating = 86%

## 2022-12-14 NOTE — Progress Notes (Signed)
[facility] NOTE       Patient ID: Anne Mitchell MRN: [id number] DOB/AGE: [birth year] 87 y.o.  Admit date: 12/10/2022 Referring Physician Dr. Judd Gaudier  Primary Physician Dr. 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 She was diuresed with IV lasix and started on scheduled duonebs with improvement. Lexiscan myoview resulted with preserved EF, without evidence of reversible ischemia, overall low risk study.   Interval History:  - feels much better today without recurrence in dyspnea, on 2L [place] - didn't sleep well overnight  - no chest pain or palpitations   Review of systems complete and found to be negative unless listed above     Past Medical History:  Diagnosis Date   Diastolic heart failure (HCC)    Emphysema (subcutaneous) (surgical) resulting from a procedure    Not correct   Emphysema lung ([place])    Hypertension     Past Surgical History:  Procedure Laterality Date   LEFT HEART CATH AND CORONARY ANGIOGRAPHY N/A 03/22/2020   Procedure: LEFT HEART CATH AND CORONARY ANGIOGRAPHY;  Surgeon: Corey Skains, MD;  Location: [place] [facility];  Service: Cardiovascular;  Laterality: N/A;   PACEMAKER LEADLESS INSERTION N/A 11/05/2022   Procedure: PACEMAKER LEADLESS INSERTION;  Surgeon: Isaias Cowman, MD;  Location: [place] [facility];  Service: Cardiovascular;  Laterality: N/A;    Medications Prior to Admission  Medication Sig Dispense Refill Last Dose   amiodarone (PACERONE) 200 MG tablet Take 1 tablet (200 mg total) by mouth daily. 30 tablet 2 12/10/2022 at AM   atorvastatin (LIPITOR) 80 MG tablet Take 1 tablet (80 mg total) by mouth daily. 30 tablet 0  12/10/2022 at AM   Azelastine HCl 137 MCG/SPRAY SOLN Place 1 spray into both nostrils in the morning and at bedtime.   12/10/2022   Calcium Carb-Cholecalciferol (CALCIUM 500 + D PO) Take 1 tablet by mouth as directed. Take 1 tablet three times a week on Sunday, Wednesday and Friday mornings.   12/10/2022 at AM   Cholecalciferol 50 MCG (2000 UT) CAPS Take 2,000 Units by mouth daily.   12/10/2022   ELIQUIS 2.5 MG TABS tablet Take 1 tablet (2.5 mg total) by mouth 2 (two) times daily. 60 tablet 2 12/10/2022 at AM   furosemide (LASIX) 40 MG tablet Take 1 tablet (40 mg total) by mouth daily. 30 tablet 0 12/10/2022 at AM   isosorbide mononitrate (IMDUR) 30 MG 24 hr tablet Take 30 mg by mouth daily.   12/10/2022 at AM   metoprolol tartrate (LOPRESSOR) 25 MG tablet Take 0.5 tablets (12.5 mg total) by mouth 2 (two) times daily. 30 tablet 2 12/10/2022 at AM   Multiple Vitamins-Minerals (VITRUM SENIOR) TABS Take by mouth.   Past Week   albuterol (VENTOLIN HFA) 108 (90 Base) MCG/ACT inhaler Inhale 2 puffs into the lungs every 6 (six) hours as needed for wheezing or shortness of breath. 8 g 2 PRN at PRN   amiodarone (PACERONE) 200 MG tablet Take 1 tablet (200 mg total) by mouth 2 (two) times daily for 9 days. 18 tablet 0    feeding supplement (ENSURE ENLIVE / ENSURE PLUS) LIQD Take 237 mLs by mouth 2 (two) times daily between meals.  sertraline (ZOLOFT) 50 MG tablet Take 50 mg by mouth daily.   12/09/2022 at [facility]   spironolactone (ALDACTONE) 25 MG tablet Take 25 mg by mouth daily. (Patient not taking: Reported on 12/11/2022)   Not Taking   Social History   Socioeconomic History   Marital status: Widowed    Spouse name: Not on file   Number of children: 1   Years of education: Not on file   Highest education level: Not on file  Occupational History   Not on file  Tobacco Use   Smoking status: Never   Smokeless tobacco: Never  Vaping Use   Vaping Use: Never used  Substance and Sexual Activity   Alcohol  use: Never   Drug use: Never   Sexual activity: Not Currently  Other Topics Concern   Not on file  Social History Narrative   Not on file   Social Determinants of Health   Financial Resource Strain: Not on file  Food Insecurity: No Food Insecurity (12/11/2022)   Hunger Vital Sign    Worried About Running Out of Food in the Last Year: Never true    Ran Out of Food in the Last Year: Never true  Transportation Needs: No Transportation Needs (12/11/2022)   PRAPARE - Hydrologist (Medical): No    Lack of Transportation (Non-Medical): No  Physical Activity: Not on file  Stress: Not on file  Social Connections: Not on file  Intimate Partner Violence: Not At Risk (12/11/2022)   Humiliation, Afraid, Rape, and Kick questionnaire    Fear of Current or Ex-Partner: No    Emotionally Abused: No    Physically Abused: No    Sexually Abused: No    Family History  Problem Relation Age of Onset   Colon cancer Mother    Hypertension Mother       Intake/Output Summary (Last 24 hours) at 12/14/2022 1253 Last data filed at 12/14/2022 1000 Gross per 24 hour  Intake 240 ml  Output --  Net 240 ml    Vitals:   12/13/22 2129 12/14/22 0352 12/14/22 0544 12/14/22 0700  BP: 129/69  (!) 142/74 128/76  Pulse: (!) 49  61 90  Resp: 19  18 20   Temp: 97.6 F (36.4 C)  98.7 F (37.1 C) 98 F (36.7 C)  TempSrc: Oral   Oral  SpO2: 99%  92% 98%  Weight:  50.2 kg    Height:        PHYSICAL EXAM General: Pleasant elderly Caucasian female, well nourished, in no acute distress.  Sitting upright with legs off hospital bed. HEENT:  Normocephalic and atraumatic. Neck:  No JVD.  Lungs: Normal respiratory effort on 2 L by nasal cannula.  Bibasilar rhonchi with decreased breath sounds, no appreciable wheezes.   Heart: HRRR . Normal S1 and S2 without gallops or murmurs.  Abdomen: Non-distended appearing.  Msk: Normal strength and tone for age. Extremities: Warm and well perfused.  No clubbing, cyanosis.  No peripheral edema.  Neuro: Alert and oriented X 3. Psych:  Answers questions appropriately.   Labs: Basic Metabolic Panel: Recent Labs    12/12/22 0246 12/14/22 0438  NA 139 136  K 3.3* 4.7  CL 102 102  CO2 26 25  GLUCOSE 97 179*  BUN 22 32*  CREATININE 1.10* 1.14*  CALCIUM 8.5* 9.0   Liver Function Tests: No results for input(s): "AST", "ALT", "ALKPHOS", "BILITOT", "PROT", "ALBUMIN" in the last 72 hours. No results for input(s): "LIPASE", "  AMYLASE" in the last 72 hours. CBC: Recent Labs    12/12/22 0246  WBC 9.0  HGB 10.2*  HCT 32.9*  MCV 94.5  PLT 250   Cardiac Enzymes: No results for input(s): "CKTOTAL", "CKMB", "CKMBINDEX", "TROPONINIHS" in the last 72 hours. BNP: No results for input(s): "BNP" in the last 72 hours. D-Dimer: No results for input(s): "DDIMER" in the last 72 hours. Hemoglobin A1C: No results for input(s): "HGBA1C" in the last 72 hours. Fasting Lipid Panel: No results for input(s): "CHOL", "HDL", "LDLCALC", "TRIG", "CHOLHDL", "LDLDIRECT" in the last 72 hours. Thyroid Function Tests: No results for input(s): "TSH", "T4TOTAL", "T3FREE", "THYROIDAB" in the last 72 hours.  Invalid input(s): "FREET3" Anemia Panel: No results for input(s): "VITAMINB12", "FOLATE", "FERRITIN", "TIBC", "IRON", "RETICCTPCT" in the last 72 hours.   Radiology: NM Myocar Multi W/Spect W/Wall Motion / EF  Result Date: 12/14/2022   The study is normal. The study is low risk.   No ST deviation was noted.   LV perfusion is normal. There is no evidence of ischemia. There is no evidence of infarction.   Left ventricular function is normal. Nuclear stress EF: 56 %. The left ventricular ejection fraction is normal (55-65%). End diastolic cavity size is normal. Conclusion Normal Myoview No evidence of stress-induced ischemia on pharmacological stress Ejection fraction of 56% with normal wall motion This is a low risk study   CT Angio Chest PE W/Cm &/Or Wo  Cm  Result Date: 12/11/2022 CLINICAL DATA:  Pulmonary embolus suspected with high probability. Chest pain, back pain, and shortness of breath for 3 weeks. EXAM: CT ANGIOGRAPHY CHEST WITH CONTRAST TECHNIQUE: Multidetector CT imaging of the chest was performed using the standard protocol during bolus administration of intravenous contrast. Multiplanar CT image reconstructions and MIPs were obtained to evaluate the vascular anatomy. RADIATION DOSE REDUCTION: This exam was performed according to the departmental dose-optimization program which includes automated exposure control, adjustment of the mA and/or kV according to patient size and/or use of iterative reconstruction technique. CONTRAST:  57mL OMNIPAQUE IOHEXOL 350 MG/ML SOLN COMPARISON:  11/01/2022 FINDINGS: Cardiovascular: Good opacification of the central and segmental pulmonary arteries. No focal filling defects. No evidence of significant pulmonary embolus. Cardiac enlargement. Reflux of contrast material into the hepatic veins suggesting right heart failure. Normal caliber thoracic aorta. Calcification of the aorta and coronary arteries. Mediastinum/Nodes: Esophagus is decompressed. Mediastinal lymph nodes are moderately prominent without pathologic enlargement, likely reactive. No change since prior study. Thyroid gland is unremarkable. Lungs/Pleura: Small bilateral pleural effusions. Diffuse interstitial and alveolar infiltrates throughout the lungs, progressing since prior study. This likely represents pulmonary edema although multifocal pneumonia could also have this appearance. Airways are clear. Upper Abdomen: No acute abnormalities. Musculoskeletal: Degenerative changes in the spine. Review of the MIP images confirms the above findings. IMPRESSION: 1. No evidence of significant pulmonary embolus. 2. Cardiac enlargement with reflux of contrast material into the hepatic veins suggesting right heart failure. 3. Diffuse bilateral airspace and  interstitial infiltration in the lungs most likely representing edema although multifocal pneumonia could also have this appearance. 4. Small bilateral pleural effusions. Electronically Signed   By: Lucienne Capers M.D.   On: 12/11/2022 03:15   DG Chest 2 View  Result Date: 12/10/2022 CLINICAL DATA:  Shortness of breath EXAM: CHEST - 2 VIEW COMPARISON:  CXR 11/18/22 FINDINGS: Loop recorder device projects over the left hemithorax. No pleural effusion. No pneumothorax. Unchanged asymmetric elevation of the right hemidiaphragm. Surgical clips in the right axilla. Unchanged cardiac and mediastinal  contours. Focal opacity. Unchanged prominent bilateral interstitial opacities, which could represent pulmonary venous congestion or atypical infection. Visualized upper unremarkable. No radiographically apparent displaced rib fractures. IMPRESSION: Unchanged prominent bilateral interstitial opacities, which could represent pulmonary venous congestion or atypical infection. Electronically Signed   By: Marin Roberts M.D.   On: 12/10/2022 20:17   DG Chest Portable 1 View  Result Date: 11/18/2022 CLINICAL DATA:  89 year old female with shortness of breath. Lower abdominal pain. Recent CHF, pacemaker placement. EXAM: PORTABLE CHEST 1 VIEW COMPARISON:  Portable chest 11/12/2022 and earlier. FINDINGS: Portable AP semi upright view at 0707 hours. Small suspected lead less cardiac pacemaker again projects over the left lower heart border, new since 11/04/2022. Continued somewhat low lung volumes. Stable cardiomegaly and mediastinal contours. Visualized tracheal air column is within normal limits. No pneumothorax, pleural effusion or consolidation. Stable pulmonary vascularity without overt edema. No areas of worsening ventilation. Negative visible bowel gas. No acute osseous abnormality identified. Right chest wall/breast surgical clips. IMPRESSION: Stable small lead-less cardiac pacemaker. No overt edema, acute cardiopulmonary  abnormality. Electronically Signed   By: Genevie Ann M.D.   On: 11/18/2022 07:52    TELEMETRY reviewed by me (LT) 12/14/2022 : None available for review  EKG reviewed by me: V paced rate 50  Data reviewed by me (LT) 12/14/2022: Hospitalist progress note, nursing notes last 24h vitals tele labs imaging I/O   Active Problems:   Essential hypertension   AF (paroxysmal atrial fibrillation)   Acute on chronic diastolic CHF (congestive heart failure)   Chronic anticoagulation   Emphysema lung   Coronary artery disease   S/P placement of cardiac pacemaker   Acute on chronic congestive heart failure   Dyspnea on exertion   Acute on chronic respiratory failure with hypoxia   COPD with acute exacerbation    ASSESSMENT AND PLAN:  Anne Mitchell is an 27yoF with a PMH of SSS s/p Medtronic Micra leadless PPM 11/05/2022, Takoutsubo CM (2021) with recovered EF 55-60% g1DD 10/2022, paroxysmal AF (eliquis), emphysema, CKD 3, who presented to [facility] ED 12/10/2022 with shortness of breath. She was diuresed with IV lasix and started on scheduled duonebs with improvement. Lexiscan myoview resulted with preserved EF, without evidence of reversible ischemia, overall low risk study.   # Acute hypoxic respiratory failure # COPD with acute exacerbation Continued improvement with DuoNebs, albuterol, and IV diuresis, on 2L by .  # Acute on chronic HFpEF Clinically euvolemic on exam today, BNP elevated on admission at 1400.  Underwent Lexiscan Myoview 3/29 which showed normal LVEF of 56%, without ST deviation, no evidence of ischemia or infarction, overall a low risk study. -Continue GDMT with metoprolol XL 12.5 mg daily, increase Lasix from 40 mg p.o. daily to 60 mg p.o. daily tomorrow.  Likely restart spironolactone tomorrow as BP and K+ allow. -Continue atorvastatin 80 mg daily -No additional cardiac diagnostics recommended at this time  # Paroxysmal AF # SSS s/p Medtronic Micra leadless PPM Not on telemetry, rate  regular on exam.  Continue amiodarone 200 mg daily for rhythm control, and Eliquis 2.5 mg twice daily for stroke prevention (dose reduced criteria with weight <60 kg and age >30)  This patient's plan of care was discussed and created with Dr. Clayborn Bigness and he is in agreement.  Signed: Tristan Schroeder , PA-C 12/14/2022, 12:53 PM [facility]

## 2022-12-14 NOTE — Care Management Important Message (Signed)
Important Message  Patient Details  Name: Nyomi Last MRN: [id number] Date of Birth: 1931-10-27   Medicare Important Message Given:  Yes     Dannette Barbara 12/14/2022, 10:45 AM

## 2022-12-14 NOTE — Evaluation (Signed)
Physical Therapy Evaluation Patient Details Name: Nell Milbourne MRN: [id number] DOB: Feb 02, 1932 Today's Date: 12/14/2022  History of Present Illness  Terrance Dorf is a 89 y.o. 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   Clinical Impression  Pt admitted with above diagnosis. Pt currently with functional limitations due to the deficits listed below (see PT Problem List). Pt received upright in recliner agreeable to PT services resting on 2L/min via [place]. Reports at baseline she is mod-I using rollator and completes all ADL's independently but does rely on daughter for transportation and some meal prep.   To date pt able to stand to RW with bouts of momentum and BUE on arm rests to successfully stand. At rest and initially with mobility, pt on RA maintains 96-97% but after ~30-40' of ambulation at supervision pt becomes SOB and SPO2 at 87%. Pt requires x2 standing rest breaks and titration back to 2 L/min via [place] to maintain > 90% with gait. Pt generally steady with safe and correct use of RW. Did require education on taking standing rest breaks PRN as pt not very aware of her DOE and need for energy conservation. Pt returns to sitting in recliner with all needs in reach. Education provided on rollator and how it can assist in energy conservation. Pt with all needs in reach. Pt will benefit from f/u PT services at discharge to address pt's deficits in limited endurance with OOB mobility and acute O2 needs.    Recommendations for follow up therapy are one component of a multi-disciplinary discharge planning process, led by the attending physician.   Recommendations may be updated based on patient status, additional functional criteria and insurance authorization.  Assistance Recommended at Discharge Set up Supervision/Assistance  Patient can return home with the following  A little help with bathing/dressing/bathroom;Assistance with cooking/housework;Assist for transportation;Help with stairs or ramp for entrance    Equipment Recommendations BSC/3in1  Recommendations for Other Services       Functional Status Assessment Patient has had a recent decline in their functional status and demonstrates the ability to make significant improvements in function in a reasonable and predictable amount of time.     Precautions / Restrictions Precautions Precautions: Fall Restrictions Weight Bearing Restrictions: No      Mobility  Bed Mobility               General bed mobility comments: NT. in Recliner pre and post testing. Patient Response: Cooperative  Transfers Overall transfer level: Needs assistance Equipment used: Rolling walker (2 wheels) Transfers: Sit to/from Stand Sit to Stand: Supervision           General transfer comment: increased time, bouts of momentum but completes without support with safe hand placement to RW.    Ambulation/Gait Ambulation/Gait assistance: Supervision Gait Distance (Feet): 62 Feet Assistive device: Rolling walker (2 wheels) Gait Pattern/deviations: Step-through pattern       General Gait Details: x2 standing rest breaks due to DOE.  Stairs            Wheelchair Mobility    Modified Rankin (Stroke Patients Only)       Balance Overall balance assessment: Needs assistance Sitting-balance support: No upper extremity supported, Feet supported  Sitting balance-Leahy Scale: Good Sitting balance - Comments: dons shoes in sitting anterior to BOS   Standing balance support: Bilateral upper extremity supported, During functional activity Standing balance-Leahy Scale:  Fair Standing balance comment: light UE placement on RW for stability                             Pertinent Vitals/Pain Pain Assessment Pain Assessment: Faces Faces Pain Scale: No hurt    Home Living Family/patient expects to be discharged to:: Private residence Living Arrangements: Children Available Help at Discharge: Family;Available PRN/intermittently Type of Home: House Home Access: Stairs to enter Entrance Stairs-Rails: Left Entrance Stairs-Number of Steps: 3   Home Layout: One level Home Equipment: Rollator (4 wheels)      Prior Function Prior Level of Function : Independent/Modified Independent             Mobility Comments: uses rollator at baseline ADLs Comments: Daughter assists with meal prep     Hand Dominance        Extremity/Trunk Assessment   Upper Extremity Assessment Upper Extremity Assessment: Defer to OT evaluation    Lower Extremity Assessment Lower Extremity Assessment: Overall WFL for tasks assessed;Generalized weakness    Cervical / Trunk Assessment Cervical / Trunk Assessment: Normal  Communication   Communication: No difficulties  Cognition Arousal/Alertness: Awake/alert Behavior During Therapy: WFL for tasks assessed/performed Overall Cognitive Status: Within Functional Limits for tasks assessed                                          General Comments      Exercises Other Exercises Other Exercises: Role of PT in acute setting, d/c recs, energy conservation techniques, PLB   Assessment/Plan    PT Assessment Patient needs continued PT services  PT Problem List Cardiopulmonary status limiting activity;Decreased strength;Decreased activity tolerance;Decreased balance       PT Treatment Interventions DME instruction;Therapeutic exercise;Gait training;Balance training;Stair training;Neuromuscular re-education;Functional mobility training;Therapeutic activities;Patient/family education    PT  Goals (Current goals can be found in the Care Plan section)  Acute Rehab PT Goals Patient Stated Goal: to return home PT Goal Formulation: With patient Time For Goal Achievement: 12/28/22 Potential to Achieve Goals: Good    Frequency Min 3X/week     Co-evaluation               AM-PAC PT "6 Clicks" Mobility  Outcome Measure Help needed turning from your back to your side while in a flat bed without using bedrails?: A Little Help needed moving from lying on your back to sitting on the side of a flat bed without using bedrails?: A Little Help needed moving to and from a bed to a chair (including a wheelchair)?: A Little Help needed standing up from a chair using your arms (e.g., wheelchair or bedside chair)?: A Little Help needed to walk in hospital room?: A Little Help needed climbing 3-5 steps with a railing? : A Little 6 Click Score: 18    End of Session Equipment Utilized During Treatment: Oxygen Activity Tolerance: Patient tolerated treatment well Patient left: in chair;with call bell/phone within reach Nurse Communication: Mobility status PT Visit Diagnosis: Other abnormalities of gait and mobility (R26.89);Muscle weakness (generalized) (M62.81)    Time: [id number] PT Time Calculation (min) (ACUTE ONLY): 18 min   Charges:   PT Evaluation $PT Eval Low  Complexity: [place] Fairly IV, PT, DPT Physical Therapist- [facility]  12/14/2022, 2:17 PM

## 2022-12-15 DIAGNOSIS — I509 Heart failure, unspecified: Secondary | ICD-10-CM | POA: Diagnosis not present

## 2022-12-15 DIAGNOSIS — R0609 Other forms of dyspnea: Secondary | ICD-10-CM | POA: Diagnosis not present

## 2022-12-15 LAB — BASIC METABOLIC PANEL
Anion gap: 9 (ref 5–15)
BUN: 38 mg/dL — ABNORMAL HIGH (ref 8–23)
CO2: 23 mmol/L (ref 22–32)
Calcium: 9.2 mg/dL (ref 8.9–10.3)
Chloride: 104 mmol/L (ref 98–111)
Creatinine, Ser: 1.19 mg/dL — ABNORMAL HIGH (ref 0.44–1.00)
GFR, Estimated: 44 mL/min — ABNORMAL LOW (ref >60)
Glucose, Bld: 196 mg/dL — ABNORMAL HIGH (ref 70–99)
Potassium: 4.3 mmol/L (ref 3.5–5.1)
Sodium: 136 mmol/L (ref 135–145)

## 2022-12-15 MED ORDER — BUDESONIDE-FORMOTEROL FUMARATE 160-4.5 MCG/ACT IN AERO: 2.0000 | INHALATION_SPRAY | Freq: Two times a day (BID) | RESPIRATORY_TRACT | 12 refills | Status: DC

## 2022-12-15 MED ORDER — ALBUTEROL SULFATE HFA 108 (90 BASE) MCG/ACT IN AERS: 2.0000 | INHALATION_SPRAY | Freq: Four times a day (QID) | RESPIRATORY_TRACT | 0 refills | Status: DC | PRN

## 2022-12-15 MED ORDER — METOPROLOL TARTRATE 25 MG PO TABS: 12.5000 mg | ORAL_TABLET | Freq: Two times a day (BID) | ORAL | 2 refills | Status: DC

## 2022-12-15 NOTE — Progress Notes (Signed)
Physical Therapy Treatment Patient Details Name: Anne Mitchell MRN: [id number] DOB: [birth year] Today's Date: 12/15/2022   History of Present Illness Anne Mitchell is a 89 y.o. 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    PT Comments     Pt received upright in recliner agreeable to PT. Pt motivated to participate. Remains on 2 L/min. Focus of session on stair mobility prior to d/c. Pt transported in recliner to steps asc/desc with step to pattern on 2 L/min requiring max VC's for single rail use similar to home set up. Pt able to asc/desc safely however it is effortful and pt relies on standing rest break at top of stairs with PLB prior to desc.  Pt easily SOB with physical exertion but SPO2 remains > 90% post stair mobility. Pt returned to room requesting continent void of bladder ambulating in room to bathroom performing toileting independently. Relies on minguard and VC's to stand from low toilet but completes her gait bouts and hand hygiene at supervision level. Pt returning to recliner with all needs in reach. Pt's biggest impairment remains for SOB with exertional activity limiting her tolerance for OOB activity. Continue to recommend needed f/u PT services to continue addressing these deficits to optimize return to PLOF.    Recommendations for follow up therapy are one component of a multi-disciplinary discharge planning process, led by the attending physician.  Recommendations may be updated based on patient status, additional functional criteria and insurance authorization.  Assistance Recommended at Discharge Set up Supervision/Assistance   Patient can return home with the following A little help with bathing/dressing/bathroom;Assistance with cooking/housework;Assist for transportation;Help with stairs or ramp for entrance   Equipment Recommendations  BSC/3in1    Recommendations for Other Services       Precautions / Restrictions Precautions Precautions: Fall Restrictions Weight Bearing Restrictions: No     Mobility  Bed Mobility               General bed mobility comments: NT. in Recliner pre and post session. Patient Response: Cooperative  Transfers Overall transfer level: Needs assistance Equipment used: Rolling walker (2 wheels) Transfers: Sit to/from Stand Sit to Stand: Supervision                Ambulation/Gait Ambulation/Gait assistance: Supervision Gait Distance (Feet): 45 Feet Assistive device: Rolling walker (2 wheels) Gait Pattern/deviations: Step-through pattern       General Gait Details: reciprocal gait to <> from bathroom using RW   Stairs Stairs: Yes Stairs assistance: Min guard Stair Management: One rail Left, Step to pattern, Forwards Number of Stairs: 4 General stair comments: max VC's for using BUE's on single hand rail asc/desc to mimic home set up. Completes safely but does have onset of SOB requiring standing rest break on top of stairs.   Wheelchair Mobility    Modified Rankin (Stroke Patients Only)       Balance Overall balance assessment: Needs assistance Sitting-balance support: No upper extremity supported, Feet supported Sitting balance-Leahy Scale: Good     Standing balance support: Bilateral upper extremity supported, During functional activity Standing balance-Leahy Scale: Fair Standing balance comment: light UE placement on RW for stability  Cognition Arousal/Alertness: Awake/alert Behavior During Therapy: WFL for tasks assessed/performed Overall Cognitive Status: Within Functional Limits for tasks  assessed                                          Exercises Other Exercises Other Exercises: x10 incentive spirometer. Required education on proper use, it's purpose, and max VC's during reps for maintaining deep inhalations.    General Comments General comments (skin integrity, edema, etc.): at rest, can remain on RA with SPO2 > 90%. Requiring 2 L/min with mobility.      Pertinent Vitals/Pain Pain Assessment Pain Assessment: No/denies pain    Home Living                          Prior Function            PT Goals (current goals can now be found in the care plan section) Acute Rehab PT Goals Patient Stated Goal: to return home PT Goal Formulation: With patient Time For Goal Achievement: 12/28/22 Potential to Achieve Goals: Good Progress towards PT goals: Progressing toward goals    Frequency    Min 3X/week      PT Plan Current plan remains appropriate    Co-evaluation              AM-PAC PT "6 Clicks" Mobility   Outcome Measure  Help needed turning from your back to your side while in a flat bed without using bedrails?: A Little Help needed moving from lying on your back to sitting on the side of a flat bed without using bedrails?: A Little Help needed moving to and from a bed to a chair (including a wheelchair)?: A Little Help needed standing up from a chair using your arms (e.g., wheelchair or bedside chair)?: A Little Help needed to walk in hospital room?: A Little Help needed climbing 3-5 steps with a railing? : A Little 6 Click Score: 18    End of Session Equipment Utilized During Treatment: Oxygen Activity Tolerance: Patient tolerated treatment well Patient left: in chair;with call bell/phone within reach Nurse Communication: Mobility status PT Visit Diagnosis: Other abnormalities of gait and mobility (R26.89);Muscle weakness (generalized) (M62.81)     Time: [id number] PT Time Calculation (min) (ACUTE ONLY): 36  min  Charges:  $Gait Training: 23-37 mins                     [place] Caster. Fairly IV, PT, DPT Physical Therapist- [facility]  12/15/2022, 11:40 AM

## 2022-12-15 NOTE — Discharge Instructions (Signed)
Please continue oxygen continuously until you follow up with your doctor in 1-2 weeks.  I stopped your IMDUR and spironolactone medications due to your blood pressure being well controlled without them while in the hospital.  Your PCP should check your blood pressure at follow up as well.   I prescribed a new inhaler to take twice daily and a rescue albuterol inhaler to take as you need to for shortness of breath.

## 2022-12-15 NOTE — Progress Notes (Signed)
Patient's daughter to pick her up and receive teaching at bedside around 1600.

## 2022-12-15 NOTE — Discharge Summary (Signed)
Physician Discharge Summary  Patient: Anne Majer ZOX:[id number] DOB: [birth year]   Code Status: Full Code Admit date: 12/10/2022 Discharge date: 12/15/2022 Disposition: Home health, PT, OT, nurse aid, and RN PCP: Dione Housekeeper, MD  Recommendations for Outpatient Follow-up:  Follow up with PCP within 1-2 weeks Regarding general hospital follow up and preventative care Recommend evaluating oxygen requirement. Discharged on 2L in acute setting.  Follow up with cardiology regarding several conditions- CHF, PAF, HTN, CAD Discontinued imdur and spironolactone for low BPs. Her HR was in high 50s on day of discharge and asymptomatic. She was continued on amiodarone and has a pacemaker in place  Discharge Diagnoses:  Active Problems:   Acute on chronic diastolic CHF (congestive heart failure)   Coronary artery disease   Essential hypertension   AF (paroxysmal atrial fibrillation)   Chronic anticoagulation   Emphysema lung   S/P placement of cardiac pacemaker   Acute on chronic congestive heart failure   Dyspnea on exertion   Acute on chronic respiratory failure with hypoxia   COPD with acute exacerbation  Brief Hospital Course Summary: Jayce Mitchell is a 89 y.o. 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  Her recent exacerbation was attributed to discontinuation of furosemide, spironolactone, Imdur and lisinopril because of hypotension.  She was restarted on furosemide and spironolactone at discharge on 3/3.  She is compliant with her meds.  Patient was last seen by her cardiologist on 3/16 at which time she continued to be symptomatic with  dyspnea on exertion and intermittent chest tightness and she has an upcoming stress test on 4/17.  Her medications were continued. At the present time patient denies chest pain, cough, fever or chills ED course and data review: Afebrile, heart rate in the 50s, tachypneic in the mid 20s and systolic blood pressure in the 120s to 130s with O2 sat in the mid 90s on room air. Troponin 19,  BNP 1404 unchanged from about 3 weeks prior.  CBC and BMP unremarkable.  EKG, personally viewed and interpreted shows A-fib at 52 with nonspecific ST-T wave changes. CTA chest negative for PE but shows bilateral airspace disease representing likely edema although multifocal pneumonia could have similar appearance as detailed below: IMPRESSION: 1. No evidence of significant pulmonary embolus. 2. Cardiac enlargement with reflux of contrast material into the hepatic veins suggesting right heart failure. 3. Diffuse bilateral airspace and interstitial infiltration in the lungs most likely representing edema although multifocal pneumonia could also have this appearance. 4. Small bilateral pleural effusions.   Patient given a dose of Lasix 40 mg and hospitalist consulted for admission.    3/29: stress test- non ischemic 3/30-3/31: continues to have gradual improvement in respiratory status with breathing treatments and diuresis.  12/15/22 -was on room air this morning and placed back on 2L with her breathing treatment. Doing much better clinically. Mild hypoxia with exertion. 4/1-4/2: patient remained euvolemic on exam and oxygen dependent with even minimal exertion. She endorsed improvement in symptoms with nebulizer treatments. Suspect that she may be chronically oxygen dependent or for extended time from emphysema.  She was discharged with continuous O2 2L and daily inhalers for maintenance and rescue, as listed below. I  recommend close follow up with PCP to reevaluate.  Discharge Condition: Stable,  improved Recommended discharge diet: Regular healthy diet  Consultations: Cardiology   Procedures/Studies: None   Allergies as of 12/15/2022       Reactions   Other Rash, Swelling   "mycin" meds Gin Gin alcohol. Swelling of the genital area   Penicillin G Itching   Banana Swelling   Penicillins Swelling   Sulfa Antibiotics         Medication List     STOP taking these medications    isosorbide mononitrate 30 MG 24 hr tablet Commonly known as: IMDUR   spironolactone 25 MG tablet Commonly known as: ALDACTONE       TAKE these medications    albuterol 108 (90 Base) MCG/ACT inhaler Commonly known as: VENTOLIN HFA Inhale 2 puffs into the lungs every 6 (six) hours as needed for wheezing or shortness of breath.   amiodarone 200 MG tablet Commonly known as: PACERONE Take 1 tablet (200 mg total) by mouth daily. What changed: Another medication with the same name was removed. Continue taking this medication, and follow the directions you see here.   atorvastatin 80 MG tablet Commonly known as: LIPITOR Take 1 tablet (80 mg total) by mouth daily.   Azelastine HCl 137 MCG/SPRAY Soln Place 1 spray into both nostrils in the morning and at bedtime.   budesonide-formoterol 160-4.5 MCG/ACT inhaler Commonly known as: Symbicort Inhale 2 puffs into the lungs 2 (two) times daily.   CALCIUM 500 + D PO Take 1 tablet by mouth as directed. Take 1 tablet three times a week on Sunday, Wednesday and Friday mornings.   Cholecalciferol 50 MCG (2000 UT) Caps Take 2,000 Units by mouth daily.   Eliquis 2.5 MG Tabs tablet Generic drug: apixaban Take 1 tablet (2.5 mg total) by mouth 2 (two) times daily.   feeding supplement Liqd Take 237 mLs by mouth 2 (two) times daily between meals.   furosemide 40 MG tablet Commonly known as: LASIX Take 1 tablet (40 mg total) by mouth daily.   metoprolol tartrate 25 MG tablet Commonly known as: LOPRESSOR Take 0.5 tablets (12.5 mg total) by  mouth 2 (two) times daily.   sertraline 50 MG tablet Commonly known as: ZOLOFT Take 50 mg by mouth daily.   Vitrum Senior Tabs Take by mouth.               Durable Medical Equipment  (From admission, onward)           Start     Ordered   12/15/22 0723  For home use only DME oxygen  Once       Question Answer Comment  Length of Need 6 Months   Mode or (Route) Nasal cannula   Liters per Minute 2   Oxygen delivery system Gas      04 /02/24 [id number]   12/15/22 0722  For home use only DME Bedside commode  Once       Question:  Patient needs a bedside commode to treat with the following condition  Answer:  SOB (shortness of breath)   12/15/22 0721            Follow-up Information     Gerlene FeeSteiner, Amanda M, PA-C. Go on 01/01/2023.   Specialty: Cardiology Why: Friday, April 19th at [address] Contact information: [address] [place] [address] [phone]                 Subjective   Pt  reports feeling well today. Denies chest pain or shortness of breath at rest.  She states that she would like to wear oxygen full time and requests inhalers at discharge.  She states she is able to tolerate ambulating for ADLs at this time comfortably.   All questions and concerns were addressed at time of discharge.  Objective  Blood pressure (!) 150/77, pulse (!) 56, temperature 98 F (36.7 C), resp. rate 18, height 5' (1.524 m), weight 50.2 kg, SpO2 100 %.   General: Pt is alert, awake, not in acute distress Cardiovascular: RRR, S1/S2 +, no rubs, no gallops Respiratory: CTA bilaterally, no wheezing, no rhonchi Abdominal: Soft, NT, ND, bowel sounds + Extremities: no edema, no cyanosis  The results of significant diagnostics from this hospitalization (including imaging, microbiology, ancillary and laboratory) are listed below for reference.   Imaging studies: NM Myocar Multi W/Spect W/Wall Motion / EF  Result Date: 12/14/2022   The study is normal. The study is  low risk.   No ST deviation was noted.   LV perfusion is normal. There is no evidence of ischemia. There is no evidence of infarction.   Left ventricular function is normal. Nuclear stress EF: 56 %. The left ventricular ejection fraction is normal (55-65%). End diastolic cavity size is normal. Conclusion Normal Myoview No evidence of stress-induced ischemia on pharmacological stress Ejection fraction of 56% with normal wall motion This is a low risk study   CT Angio Chest PE W/Cm &/Or Wo Cm  Result Date: 12/11/2022 CLINICAL DATA:  Pulmonary embolus suspected with high probability. Chest pain, back pain, and shortness of breath for 3 weeks. EXAM: CT ANGIOGRAPHY CHEST WITH CONTRAST TECHNIQUE: Multidetector CT imaging of the chest was performed using the standard protocol during bolus administration of intravenous contrast. Multiplanar CT image reconstructions and MIPs were obtained to evaluate the vascular anatomy. RADIATION DOSE REDUCTION: This exam was performed according to the departmental dose-optimization program which includes automated exposure control, adjustment of the mA and/or kV according to patient size and/or use of iterative reconstruction technique. CONTRAST:  75mL OMNIPAQUE IOHEXOL 350 MG/ML SOLN COMPARISON:  11/01/2022 FINDINGS: Cardiovascular: Good opacification of the central and segmental pulmonary arteries. No focal filling defects. No evidence of significant pulmonary embolus. Cardiac enlargement. Reflux of contrast material into the hepatic veins suggesting right heart failure. Normal caliber thoracic aorta. Calcification of the aorta and coronary arteries. Mediastinum/Nodes: Esophagus is decompressed. Mediastinal lymph nodes are moderately prominent without pathologic enlargement, likely reactive. No change since prior study. Thyroid gland is unremarkable. Lungs/Pleura: Small bilateral pleural effusions. Diffuse interstitial and alveolar infiltrates throughout the lungs, progressing since  prior study. This likely represents pulmonary edema although multifocal pneumonia could also have this appearance. Airways are clear. Upper Abdomen: No acute abnormalities. Musculoskeletal: Degenerative changes in the spine. Review of the MIP images confirms the above findings. IMPRESSION: 1. No evidence of significant pulmonary embolus. 2. Cardiac enlargement with reflux of contrast material into the hepatic veins suggesting right heart failure. 3. Diffuse bilateral airspace and interstitial infiltration in the lungs most likely representing edema although multifocal pneumonia could also have this appearance. 4. Small bilateral pleural effusions. Electronically Signed   By: Burman NievesWilliam  Stevens M.D.   On: 12/11/2022 03:15   DG Chest 2 View  Result Date: 12/10/2022 CLINICAL DATA:  Shortness of breath EXAM: CHEST - 2 VIEW COMPARISON:  CXR 11/18/22 FINDINGS: Loop recorder device projects over the left hemithorax. No pleural effusion. No pneumothorax. Unchanged asymmetric elevation of the right hemidiaphragm. Surgical  clips in the right axilla. Unchanged cardiac and mediastinal contours. Focal opacity. Unchanged prominent bilateral interstitial opacities, which could represent pulmonary venous congestion or atypical infection. Visualized upper unremarkable. No radiographically apparent displaced rib fractures. IMPRESSION: Unchanged prominent bilateral interstitial opacities, which could represent pulmonary venous congestion or atypical infection. Electronically Signed   By: Lorenza Cambridge M.D.   On: 12/10/2022 20:17   DG Chest Portable 1 View  Result Date: 11/18/2022 CLINICAL DATA:  89 year old female with shortness of breath. Lower abdominal pain. Recent CHF, pacemaker placement. EXAM: PORTABLE CHEST 1 VIEW COMPARISON:  Portable chest 11/12/2022 and earlier. FINDINGS: Portable AP semi upright view at 0707 hours. Small suspected lead less cardiac pacemaker again projects over the left lower heart border, new since  11/04/2022. Continued somewhat low lung volumes. Stable cardiomegaly and mediastinal contours. Visualized tracheal air column is within normal limits. No pneumothorax, pleural effusion or consolidation. Stable pulmonary vascularity without overt edema. No areas of worsening ventilation. Negative visible bowel gas. No acute osseous abnormality identified. Right chest wall/breast surgical clips. IMPRESSION: Stable small lead-less cardiac pacemaker. No overt edema, acute cardiopulmonary abnormality. Electronically Signed   By: Odessa Fleming M.D.   On: 11/18/2022 07:52    Labs: Basic Metabolic Panel: Recent Labs  Lab 12/10/22 1949 12/11/22 1529 12/12/22 0246 12/14/22 0438 12/15/22 0417  NA 138 139 139 136 136  K 4.1 4.0 3.3* 4.7 4.3  CL 101 104 102 102 104  CO2 24 25 26 25 23   GLUCOSE 118* 120* 97 179* 196*  BUN 27* 25* 22 32* 38*  CREATININE 1.08* 1.21* 1.10* 1.14* 1.19*  CALCIUM 9.3 8.8* 8.5* 9.0 9.2   CBC: Recent Labs  Lab 12/10/22 1949 12/12/22 0246  WBC 9.8 9.0  HGB 11.3* 10.2*  HCT 37.2 32.9*  MCV 95.9 94.5  PLT 280 250   Microbiology: Results for orders placed or performed during the hospital encounter of 11/12/22  Resp panel by RT-PCR (RSV, Flu A&B, Covid) Anterior Nasal Swab     Status: None   Collection Time: 11/12/22  2:15 PM   Specimen: Anterior Nasal Swab  Result Value Ref Range Status   SARS Coronavirus 2 by RT PCR NEGATIVE NEGATIVE Final    Comment: (NOTE) SARS-CoV-2 target nucleic acids are NOT DETECTED.  The SARS-CoV-2 RNA is generally detectable in upper respiratory specimens during the acute phase of infection. The lowest concentration of SARS-CoV-2 viral copies this assay can detect is 138 copies/mL. A negative result does not preclude SARS-Cov-2 infection and should not be used as the sole basis for treatment or other patient management decisions. A negative result may occur with  improper specimen collection/handling, submission of specimen other than  nasopharyngeal swab, presence of viral mutation(s) within the areas targeted by this assay, and inadequate number of viral copies(<138 copies/mL). A negative result must be combined with clinical observations, patient history, and epidemiological information. The expected result is Negative.  Fact Sheet for Patients:  BloggerCourse.com  Fact Sheet for Healthcare Providers:  SeriousBroker.it  This test is no t yet approved or cleared by the [place] FDA and  has been authorized for detection and/or diagnosis of SARS-CoV-2 by FDA under an Emergency Use Authorization (EUA). This EUA will remain  in effect (meaning this test can be used) for the duration of the COVID-19 declaration under Section 564(b)(1) of the Act, 21 U.S.C.section 360bbb-3(b)(1), unless the authorization is terminated  or revoked sooner.       Influenza A by PCR NEGATIVE NEGATIVE Final  Influenza B by PCR NEGATIVE NEGATIVE Final    Comment: (NOTE) The Xpert Xpress SARS-CoV-2/FLU/RSV plus assay is intended as an aid in the diagnosis of influenza from Nasopharyngeal swab specimens and should not be used as a sole basis for treatment. Nasal washings and aspirates are unacceptable for Xpert Xpress SARS-CoV-2/FLU/RSV testing.  Fact Sheet for Patients: BloggerCourse.com  Fact Sheet for Healthcare Providers: SeriousBroker.it  This test is not yet approved or cleared by the [place] FDA and has been authorized for detection and/or diagnosis of SARS-CoV-2 by FDA under an Emergency Use Authorization (EUA). This EUA will remain in effect (meaning this test can be used) for the duration of the COVID-19 declaration under Section 564(b)(1) of the Act, 21 U.S.C. section 360bbb-3(b)(1), unless the authorization is terminated or revoked.     Resp Syncytial Virus by PCR NEGATIVE NEGATIVE Final    Comment:  (NOTE) Fact Sheet for Patients: BloggerCourse.com  Fact Sheet for Healthcare Providers: SeriousBroker.it  This test is not yet approved or cleared by the [place] FDA and has been authorized for detection and/or diagnosis of SARS-CoV-2 by FDA under an Emergency Use Authorization (EUA). This EUA will remain in effect (meaning this test can be used) for the duration of the COVID-19 declaration under Section 564(b)(1) of the Act, 21 U.S.C. section 360bbb-3(b)(1), unless the authorization is terminated or revoked.  Performed at [facility], [address] [place], [place] [address]    Time coordinating discharge: Over 30 minutes  Leeroy Bock, MD  [facility] 12/15/2022, 3:03 PM

## 2022-12-15 NOTE — TOC Transition Note (Signed)
Transition of Care [facility]) - CM/SW Discharge Note   Patient Details  Name: Yesenya Lappin MRN: [id number] Date of Birth: [birth year]  Transition of Care [facility]) CM/SW Contact:  Gerilyn Pilgrim, LCSW Phone Number: 12/15/2022, 2:17 PM   Clinical Narrative:   Pt has orders to discahrge home with [facility]. [place] notified. [employer] delivered portable tank to the room for patients oxygen and will deliver concentrator and BSC to patients home. CSW signing off no additional needs at this time.           Patient Goals and CMS Choice      Discharge Placement                         Discharge Plan and Services Additional resources added to the After Visit Summary for                                       Social Determinants of Health (SDOH) Interventions SDOH Screenings   Food Insecurity: No Food Insecurity (12/11/2022)  Housing: Low Risk  (12/11/2022)  Transportation Needs: No Transportation Needs (12/11/2022)  Utilities: Not At Risk (12/11/2022)  Tobacco Use: Low Risk  (12/11/2022)     Readmission Risk Interventions     No data to display

## 2022-12-15 NOTE — TOC Progression Note (Addendum)
Transition of Care [facility]) - Progression Note    Patient Details  Name: Anne Mitchell MRN: [id number] Date of Birth: Feb 28, 1932  Transition of Care [facility]) CM/SW Contact  Gerilyn Pilgrim, LCSW Phone Number: 12/15/2022, 10:39 AM  Clinical Narrative:   CSW spoke with daughter about patient discharging. Daughter would like to resume previous [facility] services with [facility] for PT/OT/RN/AID and would also like a BSC and oxygen for home. Oxygen has been ordered through [place]. [place] notified and will bring a tank to patients room for discharge.    11:30am [facility] unable to accept due to patients address changing. Alvis Lemmings has accepted referral.       Expected Discharge Plan and Services                                               Social Determinants of Health (SDOH) Interventions SDOH Screenings   Food Insecurity: No Food Insecurity (12/11/2022)  Housing: Low Risk  (12/11/2022)  Transportation Needs: No Transportation Needs (12/11/2022)  Utilities: Not At Risk (12/11/2022)  Tobacco Use: Low Risk  (12/11/2022)    Readmission Risk Interventions     No data to display

## 2022-12-16 NOTE — Progress Notes (Deleted)
   Patient ID: Anne Mitchell, female    DOB: 03-03-1932, 89 y.o.   MRN: [id number]  HPI  Ms Anne Mitchell is a 89 y/o female with a history of  Echo 11/01/22: EF 55-60% with normal PA pressure  LHC 03/22/20:  Prox RCA lesion is 60% stenosed. Mid RCA lesion is 30% stenosed. Ost Cx to Prox Cx lesion is 75% stenosed. Prox Cx lesion is 65% stenosed. Prox LAD lesion is 30% stenosed. Mid LAD lesion is 20% stenosed. Apical ballooning with ejection fraction of 25 to 30% 75% stenosis of ostial left circumflex artery 60% stenosis of right coronary artery Mild atherosclerosis of left anterior descending artery  Admitted 12/10/22 due to SOB/ chest tightness due to a/c heart failure. IV diuresed. Lexiscan myoview 12/11/22 resulted with preserved EF, without evidence of reversible ischemia, overall low risk study. Was in the ED 11/18/22 due to SOB & fatigue d/t HF exacerbation. IV lasix given. Admitted 11/12/22 due to a/c HF exacerbation. Diuresed with IV Lasix. Weaned off oxygen. Recent stoppage of meds due to hypotension. Admitted 10/31/22 due to chest pain w/ AF RVR. Converted to junctional bradycardia. Was in the ED 09/16/22 due to bronchitis.   She presents today for her initial visit with a chief complaint of   Review of Systems    Physical Exam  Assessment & Plan:  1: Chronic heart failure with preserved ejection fraction- - NYHA class - BNP 12/10/22 was 1403.3  2: HTN- - BP - had telemedicine visit with PCP (Anne Mitchell) 11/12/22 - BMP 12/15/22 showed sodium 136, potassium 4.3, creatinine 1.19 and GFR 44  3: Atrial fibrillation- - Micra AV 2 leadless pacemaker 11/05/22 for junctional bradycardia - saw cardiology Anne Mitchell) 01/06/22

## 2022-12-17 ENCOUNTER — Encounter: Payer: Medicare Other | Admitting: Family

## 2022-12-17 ENCOUNTER — Telehealth: Payer: Self-pay | Admitting: Family

## 2022-12-17 NOTE — Telephone Encounter (Signed)
Patient did not show for her initial Heart Failure Clinic appointment on 12/17/22.

## 2022-12-29 ENCOUNTER — Emergency Department: Payer: Medicare Other

## 2022-12-29 ENCOUNTER — Encounter: Payer: Self-pay | Admitting: Radiology

## 2022-12-29 ENCOUNTER — Inpatient Hospital Stay
Admission: EM | Admit: 2022-12-29 | Discharge: 2023-01-13 | DRG: 951 | Disposition: E | Payer: Medicare Other | Attending: Student in an Organized Health Care Education/Training Program | Admitting: Student in an Organized Health Care Education/Training Program

## 2022-12-29 DIAGNOSIS — I4891 Unspecified atrial fibrillation: Secondary | ICD-10-CM | POA: Diagnosis present

## 2022-12-29 DIAGNOSIS — K7200 Acute and subacute hepatic failure without coma: Secondary | ICD-10-CM | POA: Diagnosis present

## 2022-12-29 DIAGNOSIS — I5021 Acute systolic (congestive) heart failure: Secondary | ICD-10-CM | POA: Diagnosis present

## 2022-12-29 DIAGNOSIS — E872 Acidosis, unspecified: Secondary | ICD-10-CM | POA: Diagnosis present

## 2022-12-29 DIAGNOSIS — J9601 Acute respiratory failure with hypoxia: Secondary | ICD-10-CM | POA: Diagnosis present

## 2022-12-29 DIAGNOSIS — J439 Emphysema, unspecified: Secondary | ICD-10-CM | POA: Diagnosis present

## 2022-12-29 DIAGNOSIS — I5043 Acute on chronic combined systolic (congestive) and diastolic (congestive) heart failure: Secondary | ICD-10-CM | POA: Diagnosis present

## 2022-12-29 DIAGNOSIS — R570 Cardiogenic shock: Secondary | ICD-10-CM | POA: Diagnosis present

## 2022-12-29 DIAGNOSIS — Z800 Family history of malignant neoplasm of digestive organs: Secondary | ICD-10-CM

## 2022-12-29 DIAGNOSIS — Z1152 Encounter for screening for COVID-19: Secondary | ICD-10-CM

## 2022-12-29 DIAGNOSIS — Z515 Encounter for palliative care: Secondary | ICD-10-CM | POA: Diagnosis not present

## 2022-12-29 DIAGNOSIS — Z7951 Long term (current) use of inhaled steroids: Secondary | ICD-10-CM

## 2022-12-29 DIAGNOSIS — N179 Acute kidney failure, unspecified: Secondary | ICD-10-CM | POA: Diagnosis present

## 2022-12-29 DIAGNOSIS — I130 Hypertensive heart and chronic kidney disease with heart failure and stage 1 through stage 4 chronic kidney disease, or unspecified chronic kidney disease: Principal | ICD-10-CM | POA: Diagnosis present

## 2022-12-29 DIAGNOSIS — Z79899 Other long term (current) drug therapy: Secondary | ICD-10-CM | POA: Diagnosis not present

## 2022-12-29 DIAGNOSIS — I2510 Atherosclerotic heart disease of native coronary artery without angina pectoris: Secondary | ICD-10-CM | POA: Diagnosis present

## 2022-12-29 DIAGNOSIS — Z8249 Family history of ischemic heart disease and other diseases of the circulatory system: Secondary | ICD-10-CM

## 2022-12-29 DIAGNOSIS — Z7901 Long term (current) use of anticoagulants: Secondary | ICD-10-CM | POA: Diagnosis not present

## 2022-12-29 DIAGNOSIS — Z9981 Dependence on supplemental oxygen: Secondary | ICD-10-CM | POA: Diagnosis not present

## 2022-12-29 DIAGNOSIS — G928 Other toxic encephalopathy: Secondary | ICD-10-CM | POA: Diagnosis present

## 2022-12-29 DIAGNOSIS — I1310 Hypertensive heart and chronic kidney disease without heart failure, with stage 1 through stage 4 chronic kidney disease, or unspecified chronic kidney disease: Secondary | ICD-10-CM | POA: Insufficient documentation

## 2022-12-29 DIAGNOSIS — N189 Chronic kidney disease, unspecified: Secondary | ICD-10-CM | POA: Diagnosis present

## 2022-12-29 DIAGNOSIS — Z66 Do not resuscitate: Secondary | ICD-10-CM | POA: Diagnosis present

## 2022-12-29 LAB — COMPREHENSIVE METABOLIC PANEL
ALT: 279 U/L — ABNORMAL HIGH (ref 0–44)
ALT: 456 U/L — ABNORMAL HIGH (ref 0–44)
AST: 423 U/L — ABNORMAL HIGH (ref 15–41)
AST: 704 U/L — ABNORMAL HIGH (ref 15–41)
Albumin: 3.1 g/dL — ABNORMAL LOW (ref 3.5–5.0)
Albumin: 3.3 g/dL — ABNORMAL LOW (ref 3.5–5.0)
Alkaline Phosphatase: 127 U/L — ABNORMAL HIGH (ref 38–126)
Alkaline Phosphatase: 135 U/L — ABNORMAL HIGH (ref 38–126)
Anion gap: 16 — ABNORMAL HIGH (ref 5–15)
Anion gap: 20 — ABNORMAL HIGH (ref 5–15)
BUN: 25 mg/dL — ABNORMAL HIGH (ref 8–23)
BUN: 26 mg/dL — ABNORMAL HIGH (ref 8–23)
CO2: 22 mmol/L (ref 22–32)
CO2: 19 mmol/L — ABNORMAL LOW (ref 22–32)
Calcium: 9 mg/dL (ref 8.9–10.3)
Calcium: 9.1 mg/dL (ref 8.9–10.3)
Chloride: 96 mmol/L — ABNORMAL LOW (ref 98–111)
Chloride: 98 mmol/L (ref 98–111)
Creatinine, Ser: 1.34 mg/dL — ABNORMAL HIGH (ref 0.44–1.00)
Creatinine, Ser: 1.56 mg/dL — ABNORMAL HIGH (ref 0.44–1.00)
GFR, Estimated: 38 mL/min — ABNORMAL LOW (ref >60)
GFR, Estimated: 32 mL/min — ABNORMAL LOW (ref >60)
Glucose, Bld: 139 mg/dL — ABNORMAL HIGH (ref 70–99)
Glucose, Bld: 55 mg/dL — ABNORMAL LOW (ref 70–99)
Potassium: 4.1 mmol/L (ref 3.5–5.1)
Potassium: 5.1 mmol/L (ref 3.5–5.1)
Sodium: 134 mmol/L — ABNORMAL LOW (ref 135–145)
Sodium: 137 mmol/L (ref 135–145)
Total Bilirubin: 2.6 mg/dL — ABNORMAL HIGH (ref 0.3–1.2)
Total Bilirubin: 3.3 mg/dL — ABNORMAL HIGH (ref 0.3–1.2)
Total Protein: 7 g/dL (ref 6.5–8.1)
Total Protein: 7.4 g/dL (ref 6.5–8.1)

## 2022-12-29 LAB — CBC WITH DIFFERENTIAL/PLATELET
Abs Immature Granulocytes: 0.05 10*3/uL (ref 0.00–0.07)
Basophils Absolute: 0 10*3/uL (ref 0.0–0.1)
Basophils Relative: 0 %
Eosinophils Absolute: 0 10*3/uL (ref 0.0–0.5)
Eosinophils Relative: 0 %
HCT: 32.5 % — ABNORMAL LOW (ref 36.0–46.0)
Hemoglobin: 10.2 g/dL — ABNORMAL LOW (ref 12.0–15.0)
Immature Granulocytes: 0 %
Lymphocytes Relative: 5 %
Lymphs Abs: 0.6 10*3/uL — ABNORMAL LOW (ref 0.7–4.0)
MCH: 29.7 pg (ref 26.0–34.0)
MCHC: 31.4 g/dL (ref 30.0–36.0)
MCV: 94.5 fL (ref 80.0–100.0)
Monocytes Absolute: 0.9 10*3/uL (ref 0.1–1.0)
Monocytes Relative: 7 %
Neutro Abs: 11.2 10*3/uL — ABNORMAL HIGH (ref 1.7–7.7)
Neutrophils Relative %: 88 %
Platelets: 249 10*3/uL (ref 150–400)
RBC: 3.44 MIL/uL — ABNORMAL LOW (ref 3.87–5.11)
RDW: 18.4 % — ABNORMAL HIGH (ref 11.5–15.5)
WBC: 12.8 10*3/uL — ABNORMAL HIGH (ref 4.0–10.5)
nRBC: 0 % (ref 0.0–0.2)

## 2022-12-29 LAB — LACTIC ACID, PLASMA
Lactic Acid, Venous: 6.6 mmol/L (ref 0.5–1.9)
Lactic Acid, Venous: >9 mmol/L (ref 0.5–1.9)

## 2022-12-29 LAB — BRAIN NATRIURETIC PEPTIDE: B Natriuretic Peptide: 1999 pg/mL — ABNORMAL HIGH (ref 0.0–100.0)

## 2022-12-29 LAB — TROPONIN I (HIGH SENSITIVITY)
Troponin I (High Sensitivity): 163 ng/L (ref <18)
Troponin I (High Sensitivity): 308 ng/L (ref <18)

## 2022-12-29 LAB — BLOOD GAS, VENOUS
Acid-base deficit: 0.4 mmol/L (ref 0.0–2.0)
Bicarbonate: 24.8 mmol/L (ref 20.0–28.0)
Delivery systems: POSITIVE
FIO2: 40 %
O2 Saturation: 42.6 %
Patient temperature: 37
pCO2, Ven: 42 mmHg — ABNORMAL LOW (ref 44–60)
pH, Ven: 7.38 (ref 7.25–7.43)
pO2, Ven: 36 mmHg (ref 32–45)

## 2022-12-29 LAB — LIPASE, BLOOD: Lipase: 28 U/L (ref 11–51)

## 2022-12-29 LAB — SARS CORONAVIRUS 2 BY RT PCR: SARS Coronavirus 2 by RT PCR: NEGATIVE

## 2022-12-29 MED ORDER — ENOXAPARIN SODIUM 40 MG/0.4ML IJ SOSY: 40.0000 mg | PREFILLED_SYRINGE | INTRAMUSCULAR | Status: DC

## 2022-12-29 MED ORDER — SODIUM CHLORIDE 0.9 % IV SOLN: INTRAVENOUS | Status: DC

## 2022-12-29 MED ORDER — HYDROMORPHONE HCL 1 MG/ML IJ SOLN
2.0000 mg | INTRAMUSCULAR | Status: DC | PRN
  Administered 2022-12-29: 1 mg via INTRAVENOUS
  Administered 2022-12-29: 2 mg via INTRAVENOUS
  Filled 2022-12-29 (×2): qty 2

## 2022-12-29 MED ORDER — NOREPINEPHRINE 4 MG/250ML-% IV SOLN
INTRAVENOUS | Status: AC
  Filled 2022-12-29: qty 250

## 2022-12-29 MED ORDER — IPRATROPIUM-ALBUTEROL 0.5-2.5 (3) MG/3ML IN SOLN
3.0000 mL | Freq: Once | RESPIRATORY_TRACT | Status: AC
  Administered 2022-12-29: 3 mL via RESPIRATORY_TRACT
  Filled 2022-12-29: qty 3

## 2022-12-29 MED ORDER — IOHEXOL 300 MG/ML  SOLN: 75.0000 mL | Freq: Once | INTRAMUSCULAR | Status: DC | PRN

## 2022-12-29 MED ORDER — METHYLPREDNISOLONE SODIUM SUCC 125 MG IJ SOLR
125.0000 mg | Freq: Once | INTRAMUSCULAR | Status: AC
  Administered 2022-12-29: 125 mg via INTRAVENOUS
  Filled 2022-12-29: qty 2

## 2022-12-29 MED ORDER — FUROSEMIDE 10 MG/ML IJ SOLN
80.0000 mg | Freq: Once | INTRAMUSCULAR | Status: AC
  Administered 2022-12-29: 80 mg via INTRAVENOUS
  Filled 2022-12-29: qty 8

## 2022-12-29 MED ORDER — NOREPINEPHRINE 4 MG/250ML-% IV SOLN
0.0000 ug/min | INTRAVENOUS | Status: DC
  Administered 2022-12-29: 2 ug/min via INTRAVENOUS

## 2022-12-29 MED ORDER — SODIUM CHLORIDE 0.9 % IV BOLUS
500.0000 mL | Freq: Once | INTRAVENOUS | Status: AC
  Administered 2022-12-29: 500 mL via INTRAVENOUS

## 2022-12-29 MED ORDER — HYDROMORPHONE HCL 1 MG/ML IJ SOLN: 1.0000 mg | INTRAMUSCULAR | Status: DC | PRN

## 2022-12-29 MED ORDER — MIDAZOLAM HCL 2 MG/2ML IJ SOLN
1.0000 mg | INTRAMUSCULAR | Status: DC | PRN
  Administered 2022-12-29: 1 mg via INTRAVENOUS
  Filled 2022-12-29: qty 2

## 2022-12-29 MED ORDER — METOPROLOL TARTRATE 5 MG/5ML IV SOLN
5.0000 mg | Freq: Once | INTRAVENOUS | Status: AC
  Administered 2022-12-29: 5 mg via INTRAVENOUS
  Filled 2022-12-29: qty 5

## 2022-12-29 MED ORDER — HYDROMORPHONE HCL 1 MG/ML IJ SOLN
0.5000 mg | Freq: Once | INTRAMUSCULAR | Status: AC
  Administered 2022-12-29: 0.5 mg via INTRAVENOUS
  Filled 2022-12-29: qty 0.5

## 2023-01-06 ENCOUNTER — Other Ambulatory Visit: Payer: Self-pay | Admitting: Student in an Organized Health Care Education/Training Program

## 2023-01-13 NOTE — ED Provider Notes (Signed)
-----------------------------------------   4:29 PM on Dec 31, 2022 -----------------------------------------   I took over care of this patient from Dr. Alfred Levins.  On reassessment, the patient is much more comfortable appearing although she still has increased work of breathing and is requiring BiPAP but she states that the abdominal pain is better.  She is tachycardic to the 130s and appears to be in atrial fibrillation.  I have ordered IV metoprolol.  Workup so far is suggestive of acute CHF given elevated BNP as well as the chest x-ray findings.  Troponin is also elevated consistent with demand ischemia.  There is no evidence of ACS.  Lactic is also elevated although I suspect that this is more likely due to the patient's respiratory distress and increased work of breathing rather than sepsis.  Besides the tachycardia, the patient is afebrile.  WBC count is minimally elevated.  Overall presentation favors CHF exacerbation rather than infectious etiology, and given the pulmonary and peripheral edema, fluids will likely be harmful at this time.  Workup has also revealed elevated LFTs and bilirubin.  Some of this might be due to hemolysis.  The patient is complaining of abdominal pain.  She is getting a right upper quadrant ultrasound and if this is inconclusive she likely will need a CT as well.  ----------------------------------------- 5:45 PM on December 31, 2022 -----------------------------------------  RUQ ultrasound is negative.  The heart rate came down with the single dose of metoprolol to the 90s, however the patient is now hypotensive.  Repeat lactate and CMP are pending.  I have ordered a small fluid bolus and Levophed after several low blood pressure readings, although the presentation is still less consistent with sepsis.  I consulted and discussed the case with Dr. Aundria Rud from the ICU to evaluate the patient for possible ICU admission.  ----------------------------------------- 6:20 PM on  31-Dec-2022 -----------------------------------------  ICU team has evaluated the patient. Per Dr. Aundria Rud the patient is likely in cardiogenic shock.  Based on discussion with the family member, he recommends comfort measures.  He will plan to admit to the ICU, although there are currently no beds.  The patient remains hypotensive and the repeat lactate is increased.  BiPAP will be discontinued when additional family arrives.   .Critical Care  Performed by: Dionne Bucy, MD Authorized by: Dionne Bucy, MD   Critical care provider statement:    Critical care time (minutes):  60   Critical care time was exclusive of:  Separately billable procedures and treating other patients   Critical care was necessary to treat or prevent imminent or life-threatening deterioration of the following conditions:  Respiratory failure, cardiac failure and shock   Critical care was time spent personally by me on the following activities:  Development of treatment plan with patient or surrogate, discussions with consultants, evaluation of patient's response to treatment, examination of patient, ordering and review of laboratory studies, ordering and review of radiographic studies, ordering and performing treatments and interventions, pulse oximetry, re-evaluation of patient's condition, review of old charts and obtaining history from patient or surrogate   I assumed direction of critical care for this patient from another provider in my specialty: yes     Care discussed with: admitting provider       Dionne Bucy, MD 12-31-22 1909

## 2023-01-13 NOTE — Death Summary Note (Cosign Needed Addendum)
DEATH SUMMARY   Patient Details  Name: Anne Mitchell MRN: [id number] DOB: 24-Mar-1932  Admission/Discharge Information   Admit Date:  Jan 16, 2023  Date of Death:  01/16/23  Time of Death:  19:30  Length of Stay: 0  Referring Physician: Dione Housekeeper, MD   Reason(s) for Hospitalization  Cardiogenic shock  Diagnoses  Preliminary cause of death: Cardiogenic shock s/t Congestive Heart failure Secondary Diagnoses (including complications and co-morbidities):  Principal Problem:   Cardiogenic shock Active Problems:   Acute systolic CHF (congestive heart failure)   Coronary artery disease   Toxic metabolic encephalopathy   Acute hypoxic respiratory failure   Acute renal failure   Shock liver   Cardiorenal syndrome   Brief Hospital Course (including significant findings, care, treatment, and services provided and events leading to death)  Anne Mitchell is a 89 y.o. year old female who presented to the hospital with shortness of breath. Of note she has a past medical history of CAD, heart failure (HFpEF, last EF documented in February 2024 at 55%), atrial fibrillation on Eliquis, and recurrent decompensations of her heart failure.   Patient was seen and examined in the emergency department in the presence of her daughter.  The daughter reports that the patient was doing okay up until yesterday.  This morning, she woke up and she was increasingly short of breath.  She was recently seen by her outpatient providers and her furosemide dose was doubled.  Patient's lower extremity edema continued to worsen.  This morning, she woke up and was acutely short of breath prompting this presentation. No fevers or chills reported.  Patient was recently discharged from the hospital on 12/15/2022 after a 5-day admission for a similar presentation.  The patient was diuresed with some improvement and discharged home.  Patient's daughter reports that her mother has been suffering secondary to all her  medical illnesses and that her quality of life has precipitously deteriorated.  Review of the medical record notes 3 admissions for heart failure since February, with today being the fourth.   Patient was seen in the ED with blood work notable for a BNP of 1999, and elevated troponin, and lactic acidosis with initial lactic acid of 6.  She was started on BiPAP given her respiratory distress.  Other blood work include an AKI, with a worsening creatinine over the course of the day as well as trends Eminase's that are elevated and continue to rise.  Most recent lactic acid level was greater than 9. Point-of-care ultrasound at the bedside performed by me showed severely depressed ejection fraction in the left ventricle as well as the right ventricle with signs of RV pressure overload (septal bowing/balance into the LV as well as a dilated right ventricle).  Coupled with severe hypotension, lactic acidosis, worsening kidney dysfunction, and elevated transaminases, this picture is consistent with cardiogenic shock.   I explained this at length to the daughter at the bedside.  I explained that medical care would involve intubation, central line placement with vasopressor initiation as well as possibly dialysis.  I explained that this is unlikely to prolong [name]'s life in any meaningful way and mortality in cardiogenic shock is very elevated.  Patient's daughter is very understanding and expressed sadness that her mother has been suffering secondary to her medical illness and would like to transition her to comfort measures.  Patient passed away with family bedside.   Pertinent Labs and Studies  Significant Diagnostic Studies US ABDOMEN LIMITED RUQ (LIVER/GB)  Result Date: 01-16-2023  CLINICAL DATA:  Abdominal pain EXAM: ULTRASOUND ABDOMEN LIMITED RIGHT UPPER QUADRANT COMPARISON:  CT abdomen/pelvis 11/01/2022. FINDINGS: Gallbladder: No gallstones or wall thickening visualized. No sonographic Murphy sign  noted by sonographer. Common bile duct: Diameter: 3 mm Liver: No focal lesion identified. Within normal limits in parenchymal echogenicity. Portal vein is patent on color Doppler imaging with normal direction of blood flow towards the liver. Other: None. IMPRESSION: Normal right upper quadrant ultrasound. Electronically Signed   By: Lesia Hausen M.D.   On: 01/09/2023 16:50   DG Chest Portable 1 View  Result Date: 2023-01-09 CLINICAL DATA:  Shortness of breath EXAM: PORTABLE CHEST 1 VIEW COMPARISON:  X-ray and CT angiogram 12/02/2022. Older x-rays as well FINDINGS: Elevated right hemidiaphragm. Enlarged cardiopericardial silhouette. Loop recorder. Overlapping cardiac leads. Degenerative changes of the spine. Surgical clips in the right axillary region. Increasing interstitial and parenchymal opacities bilaterally and diffuse. No pneumothorax or effusion. IMPRESSION: Increasing interstitial and parenchymal changes. Differential includes edema versus infiltrate. Recommend follow-up. Enlarged heart.  Loop recorder Electronically Signed   By: Karen Kays M.D.   On: 01/09/2023 15:02   NM Myocar Multi W/Spect W/Wall Motion / EF  Result Date: 12/14/2022   The study is normal. The study is low risk.   No ST deviation was noted.   LV perfusion is normal. There is no evidence of ischemia. There is no evidence of infarction.   Left ventricular function is normal. Nuclear stress EF: 56 %. The left ventricular ejection fraction is normal (55-65%). End diastolic cavity size is normal. Conclusion Normal Myoview No evidence of stress-induced ischemia on pharmacological stress Ejection fraction of 56% with normal wall motion This is a low risk study   CT Angio Chest PE W/Cm &/Or Wo Cm  Result Date: 12/11/2022 CLINICAL DATA:  Pulmonary embolus suspected with high probability. Chest pain, back pain, and shortness of breath for 3 weeks. EXAM: CT ANGIOGRAPHY CHEST WITH CONTRAST TECHNIQUE: Multidetector CT imaging of the chest  was performed using the standard protocol during bolus administration of intravenous contrast. Multiplanar CT image reconstructions and MIPs were obtained to evaluate the vascular anatomy. RADIATION DOSE REDUCTION: This exam was performed according to the departmental dose-optimization program which includes automated exposure control, adjustment of the mA and/or kV according to patient size and/or use of iterative reconstruction technique. CONTRAST:  75mL OMNIPAQUE IOHEXOL 350 MG/ML SOLN COMPARISON:  11/01/2022 FINDINGS: Cardiovascular: Good opacification of the central and segmental pulmonary arteries. No focal filling defects. No evidence of significant pulmonary embolus. Cardiac enlargement. Reflux of contrast material into the hepatic veins suggesting right heart failure. Normal caliber thoracic aorta. Calcification of the aorta and coronary arteries. Mediastinum/Nodes: Esophagus is decompressed. Mediastinal lymph nodes are moderately prominent without pathologic enlargement, likely reactive. No change since prior study. Thyroid gland is unremarkable. Lungs/Pleura: Small bilateral pleural effusions. Diffuse interstitial and alveolar infiltrates throughout the lungs, progressing since prior study. This likely represents pulmonary edema although multifocal pneumonia could also have this appearance. Airways are clear. Upper Abdomen: No acute abnormalities. Musculoskeletal: Degenerative changes in the spine. Review of the MIP images confirms the above findings. IMPRESSION: 1. No evidence of significant pulmonary embolus. 2. Cardiac enlargement with reflux of contrast material into the hepatic veins suggesting right heart failure. 3. Diffuse bilateral airspace and interstitial infiltration in the lungs most likely representing edema although multifocal pneumonia could also have this appearance. 4. Small bilateral pleural effusions. Electronically Signed   By: Burman Nieves M.D.   On: 12/11/2022 03:15   DG  Chest  2 View  Result Date: 12/10/2022 CLINICAL DATA:  Shortness of breath EXAM: CHEST - 2 VIEW COMPARISON:  CXR 11/18/22 FINDINGS: Loop recorder device projects over the left hemithorax. No pleural effusion. No pneumothorax. Unchanged asymmetric elevation of the right hemidiaphragm. Surgical clips in the right axilla. Unchanged cardiac and mediastinal contours. Focal opacity. Unchanged prominent bilateral interstitial opacities, which could represent pulmonary venous congestion or atypical infection. Visualized upper unremarkable. No radiographically apparent displaced rib fractures. IMPRESSION: Unchanged prominent bilateral interstitial opacities, which could represent pulmonary venous congestion or atypical infection. Electronically Signed   By: Lorenza Cambridge M.D.   On: 12/10/2022 20:17    Microbiology Recent Results (from the past 240 hour(s))  SARS Coronavirus 2 by RT PCR (hospital order, performed in [facility] hospital lab) *cepheid single result test* Anterior Nasal Swab     Status: None   Collection Time: 2023-01-16  3:32 PM   Specimen: Anterior Nasal Swab  Result Value Ref Range Status   SARS Coronavirus 2 by RT PCR NEGATIVE NEGATIVE Final    Comment: (NOTE) SARS-CoV-2 target nucleic acids are NOT DETECTED.  The SARS-CoV-2 RNA is generally detectable in upper and lower respiratory specimens during the acute phase of infection. The lowest concentration of SARS-CoV-2 viral copies this assay can detect is 250 copies / mL. A negative result does not preclude SARS-CoV-2 infection and should not be used as the sole basis for treatment or other patient management decisions.  A negative result may occur with improper specimen collection / handling, submission of specimen other than nasopharyngeal swab, presence of viral mutation(s) within the areas targeted by this assay, and inadequate number of viral copies (<250 copies / mL). A negative result must be combined with clinical observations, patient  history, and epidemiological information.  Fact Sheet for Patients:   RoadLapTop.co.za  Fact Sheet for Healthcare Providers: http://kim-miller.com/  This test is not yet approved or  cleared by the [place] FDA and has been authorized for detection and/or diagnosis of SARS-CoV-2 by FDA under an Emergency Use Authorization (EUA).  This EUA will remain in effect (meaning this test can be used) for the duration of the COVID-19 declaration under Section 564(b)(1) of the Act, 21 U.S.C. section 360bbb-3(b)(1), unless the authorization is terminated or revoked sooner.  Performed at [facility], [address] [place], [place] [address]     Lab Basic Metabolic Panel: Recent Labs  Lab 01/16/2023 1419 Jan 16, 2023 1728  NA 134* 137  K 4.1 5.1  CL 96* 98  CO2 22 19*  GLUCOSE 139* 55*  BUN 25* 26*  CREATININE 1.34* 1.56*  CALCIUM 9.0 9.1   Liver Function Tests: Recent Labs  Lab 2023-01-16 1419 01-16-2023 1728  AST 423* 704*  ALT 279* 456*  ALKPHOS 127* 135*  BILITOT 2.6* 3.3*  PROT 7.0 7.4  ALBUMIN 3.1* 3.3*   Recent Labs  Lab 2023-01-16 1544  LIPASE 28   No results for input(s): "AMMONIA" in the last 168 hours. CBC: Recent Labs  Lab 2023/01/16 1419  WBC 12.8*  NEUTROABS 11.2*  HGB 10.2*  HCT 32.5*  MCV 94.5  PLT 249   Cardiac Enzymes: No results for input(s): "CKTOTAL", "CKMB", "CKMBINDEX", "TROPONINI" in the last 168 hours. Sepsis Labs: Recent Labs  Lab Jan 16, 2023 1419 2023/01/16 1420 Jan 16, 2023 1728  WBC 12.8*  --   --   LATICACIDVEN  --  6.6* >9.0*    Procedures/Operations     Aizley Stenseth L Rust-Chester 16-Jan-2023, 7:32 PM  Cheryll Cockayne Rust-Chester,  AGACNP-BC Acute Care Nurse Practitioner [facility]   [phone] / [phone] Please see Amion for pager details.

## 2023-01-13 NOTE — ED Triage Notes (Signed)
Pt presents to the ED via [facility] due to SOB that started today. Pt's home health aid was at bedside with pulse 88-90 3L [place]. Per EMS nonrebreather 15L, 92. Pt placed on CPAP, 1 duoneb and pt appeared more comfortable per EMS. pT a&oX4. Pt hard of hearing.

## 2023-01-13 NOTE — ED Provider Notes (Addendum)
[facility] Provider Note    Event Date/Time   First MD Initiated Contact with Patient 01/05/2023 1414     (approximate)   History   Shortness of Breath   HPI  Anne Mitchell is a 89 y.o. female  with h/o COPD, CHF, Afib on eliquis who comes in with abdominal pain and shortness of breath.  Patient reports having mid abdominal pain that started yesterday.  She also reports having shortness of breath that started yesterday with increasing swelling.  She reports being compliant with her medications including her Eliquis denies any falls and hitting her head.  Patient is typically on 3 L and was placed on a nonrebreather she was then placed on CPAP and given 1 DuoNeb and patient appeared more comfortable.  She is hard of hearing.  Physical Exam   Triage Vital Signs: ED Triage Vitals  Enc Vitals Group     BP 01/05/2023 1418 111/75     Pulse Rate 05-Jan-2023 1410 (!) 117     Resp 01/05/2023 1418 (!) 36     Temp Jan 05, 2023 1418 98.3 F (36.8 C)     Temp Source 01-05-23 1418 Axillary     SpO2 01-05-23 1410 98 %     Weight 01-05-23 1417 110 lb 10.7 oz (50.2 kg)     Height 01-05-23 1417 5' (1.524 m)     Head Circumference --      Peak Flow --      Pain Score 01/05/23 1417 0     Pain Loc --      Pain Edu? --      Excl. in GC? --     Most recent vital signs: Vitals:   2023/01/05 1410 01/05/23 1418  BP:  111/75  Pulse: (!) 117 (!) 112  Resp:  (!) 36  Temp:  98.3 F (36.8 C)  SpO2: 98% 100%     General: Awake, no distress.  CV:  Good peripheral perfusion.  Resp:  Normal effort.  Increased work of breathing Abd:  Tender in her mid abdomen Other:  Swelling noted in her feet   ED Results / Procedures / Treatments   Labs (all labs ordered are listed, but only abnormal results are displayed) Labs Reviewed  BLOOD GAS, VENOUS - Abnormal; Notable for the following components:      Result Value   pCO2, Ven 42 (*)    All other components within normal limits   CBC WITH DIFFERENTIAL/PLATELET  COMPREHENSIVE METABOLIC PANEL  LACTIC ACID, PLASMA  LACTIC ACID, PLASMA  BRAIN NATRIURETIC PEPTIDE  TROPONIN I (HIGH SENSITIVITY)     EKG  My interpretation of EKG:  Atrial fibrillation with a rate of 112 with widened QRS, no ST elevation or T wave inversion.  Widened QRS similar to prior  RADIOLOGY I have reviewed the xray personally and interpreted and patient has pulmonary edema   PROCEDURES:  Critical Care performed: Yes, see critical care procedure note(s)  .1-3 Lead EKG Interpretation  Performed by: Concha Se, MD Authorized by: Concha Se, MD     Interpretation: abnormal     ECG rate:  112   ECG rate assessment: tachycardic     Rhythm: atrial fibrillation     Ectopy: none     Conduction: normal   .Critical Care  Performed by: Concha Se, MD Authorized by: Concha Se, MD   Critical care provider statement:    Critical care time (minutes):  30   Critical care  was necessary to treat or prevent imminent or life-threatening deterioration of the following conditions:  Respiratory failure   Critical care was time spent personally by me on the following activities:  Development of treatment plan with patient or surrogate, discussions with consultants, evaluation of patient's response to treatment, examination of patient, ordering and review of laboratory studies, ordering and review of radiographic studies, ordering and performing treatments and interventions, pulse oximetry, re-evaluation of patient's condition and review of old charts    MEDICATIONS ORDERED IN ED: Medications  ipratropium-albuterol (DUONEB) 0.5-2.5 (3) MG/3ML nebulizer solution 3 mL (has no administration in time range)  ipratropium-albuterol (DUONEB) 0.5-2.5 (3) MG/3ML nebulizer solution 3 mL (has no administration in time range)  methylPREDNISolone sodium succinate (SOLU-MEDROL) 125 mg/2 mL injection 125 mg (has no administration in time range)   HYDROmorphone (DILAUDID) injection 0.5 mg (has no administration in time range)     IMPRESSION / MDM / ASSESSMENT AND PLAN / ED COURSE  I reviewed the triage vital signs and the nursing notes.   Patient's presentation is most consistent with acute presentation with potential threat to life or bodily function.   Patient comes in tachycardic increased work of breathing VBG without evidence of hypercapnia but I concern about the potential of fluid overload so we will load on BiPAP for work of breathing at this time while we are waiting chest x-ray.  Will also cover for potential COPD given history of emphysema with DuoNebs, steroids.  Patient is really mostly reporting abdominal pain to me will give some Dilaudid and see if this helps with her pain and helps her relax more.  Labs ordered to further evaluate for this suspect patient will need CT imaging but will need her to to have her breathing and pain to be better controlled before attempting CT.  She denies any known falls.  Patient will be handed off to oncoming team pending these results and will need reevaluation after labs, pain medication.  The patient is on the cardiac monitor to evaluate for evidence of arrhythmia and/or significant heart rate changes.      FINAL CLINICAL IMPRESSION(S) / ED DIAGNOSES   Final diagnoses:  Acute respiratory failure with hypoxia     Rx / DC Orders   ED Discharge Orders     None        Note:  This document was prepared using Dragon voice recognition software and may include unintentional dictation errors.   Concha Se, MD 02-Jan-2023 1452    Concha Se, MD 2023/01/02 1453    Concha Se, MD 02-Jan-2023 [phone]

## 2023-01-13 NOTE — IPAL (Addendum)
  Interdisciplinary Goals of Care Family Meeting   Date carried out: 01/04/2023  Location of the meeting: Bedside  Member's involved: Physician and Family Member or next of kin  Durable Power of Attorney or acting medical decision maker: Daughter, Fletcher Anon  Discussion: We discussed goals of care for [employer]. I explained to Ms. Huinker that Ms. Tison is in cardiogenic shock, with severe heart failure (bedside echocardiogram performed by me in the presence of the patient's daughter showed severe drop in ejection fraction). We discussed Ms. Simmonds's wishes revolving end of life care. The daughter reports that Ms. Bittick would not want to be intubated, nor resuscitated. She would also not want chest compressions. I explained to the daughter that medical care of cardiogenic shock would include placing central lines for infusion of medications, initiation of vasopressors, initiation of diuretics, and intubation given her respiratory status. Ms. Ali Lowe reports her mother would not want any of this, and that she would like for her to be comfortable. She is going to call family members before making her decision, and we agreed to continue care as is, and not escalate at the moment.  Update: patient's grand daughter at the bedside, daughter and grand daughter updated. Patient is in severe cardiogenic shock and is progressively more hypotensive and encephalopathic. They would like to proceed with comfort measures.  Code status:   Code Status: DNR   Disposition: In-patient comfort care  Time spent for the meeting: 30 minutes    Raechel Chute, MD  01/04/23, 6:52 PM

## 2023-01-13 NOTE — H&P (Signed)
NAME:  Anne Mitchell, MRN:  [id number], DOB:  Jun 25, 1932, LOS: 0 ADMISSION DATE:  01-17-23, CHIEF COMPLAINT:  shortness of breath   History of Present Illness:   The patient is an 89 year old female with a past medical history of CAD, heart failure (HFpEF, last EF documented in February 2024 at 55%), atrial fibrillation on Eliquis, and recurrent decompensations of her heart failure who presented to the hospital with shortness of breath.  Patient was seen and examined in the emergency department in the presence of her daughter.  The daughter reports that the patient was doing okay up until yesterday.  This morning, she woke up and she was increasingly short of breath.  She was recently seen by her outpatient providers and her furosemide dose was doubled.  Patient's lower extremity edema continued to worsen.  This morning, she woke up and was acutely short of breath prompting this presentation. No fevers or chills reported.  Patient was recently discharged from the hospital on 12/15/2022 after a 5-day admission for a similar presentation.  The patient was diuresed with some improvement and discharged home.  Patient's daughter reports that her mother has been suffering secondary to all her medical illnesses and that her quality of life has precipitously deteriorated.  Review of the medical record notes 3 admissions for heart failure since February, with today being the fourth.  Patient was seen in the ED with blood work notable for a BNP of 1999, and elevated troponin, and lactic acidosis with initial lactic acid of 6.  She was started on BiPAP given her respiratory distress.  Other blood work include an AKI, with a worsening creatinine over the course of the day as well as trends Eminase's that are elevated and continue to rise.  Most recent lactic acid level was greater than 9.  Objective   Blood pressure (!) 94/58, pulse (!) 116, temperature 98.3 F (36.8 C), temperature source Axillary, resp. rate  (!) 27, height 5' (1.524 m), weight 50.2 kg, SpO2 97 %.    FiO2 (%):  [35 %] 35 %  No intake or output data in the 24 hours ending Jan 17, 2023 1824 Filed Weights   01-17-23 1417  Weight: 50.2 kg    Examination: Physical Exam Constitutional:      General: She is in acute distress.     Appearance: She is ill-appearing.  HENT:     Mouth/Throat:     Comments: Bipap mask in place Cardiovascular:     Rate and Rhythm: Regular rhythm. Tachycardia present.     Pulses: Normal pulses.  Pulmonary:     Effort: Respiratory distress present.     Breath sounds: Rales present.  Abdominal:     Palpations: Abdomen is soft.  Neurological:     Mental Status: She is disoriented.     Assessment and Plan:  89 year old female with history of CHF presenting with shortness of breath and found to have cardiogenic shock and acute hypoxic respiratory failure.   Neurology # Toxic metabolic encephalopathy  In the setting of cardiogenic shock and poor perfusion to the brain as well as metabolic acidosis.  -comfort measures orders placed, hydromorphone and midazolam placed  Cardiovascular # Cardiogenic shock #HFrEF  Point-of-care ultrasound at the bedside performed by me showed severely depressed ejection fraction in the left ventricle as well as the right ventricle with signs of RV pressure overload (septal bowing/balance into the LV as well as a dilated right ventricle).  Coupled with severe hypotension, lactic acidosis, worsening kidney  dysfunction, and elevated transaminases, this picture is consistent with cardiogenic shock.  I explained this at length to the daughter at the bedside.  I explained that medical care would involve intubation, central line placement with vasopressor initiation as well as possibly dialysis.  I explained that this is unlikely to prolong Ms. Ley's life in any meaningful way and mortality in cardiogenic shock is very elevated.  Patient's daughter is very understanding and  expressed sadness that her mother has been suffering secondary to her medical illness and would like to transition her to comfort measures.  -transition to comfort measures -initially placed on nor-epinephrine, will discontinue  Pulmonary # Acute hypoxic respiratory failure  In the setting of pulmonary edema secondary to cardiogenic shock.  Diffuse crackles noted on auscultation.  She is currently on BiPAP which we will continue until family is able to present to the bedside. Patient has metabolic acidosis that she is attempting to compensate for. She also has significant work of breathing resulting in discomfort. We will initiate hydromorphone for work of breathing, and I suspect her metabolic acidosis will quickly worsen off BIPAP.  -comfort measures only -hydromorphone for work of breathing  Gastrointestinal # Shock liver  Notably elevated transaminases that continue to rise in the setting of cardiogenic shock.  Point-of-care ultrasound does show an enlarged liver with a very dilated IVC.  -comfort measures only  Renal # Cardiorenal syndrome # Acute renal failure  Secondary to cardiogenic shock.  Lactic acidosis also due to the shock state.  Patient unlikely to benefit from any mechanical support and will not be a candidate for dialysis.  This was discussed with the patient's daughter who would like to nutrition her to comfort measures.  Endocrine -comfort measures only  Hem/Onc -comfort measures only  ID -comfort measures only    Labs   CBC: Recent Labs  Lab 01-16-23 1419  WBC 12.8*  NEUTROABS 11.2*  HGB 10.2*  HCT 32.5*  MCV 94.5  PLT 249    Basic Metabolic Panel: Recent Labs  Lab Jan 16, 2023 1419 Jan 16, 2023 1728  NA 134* 137  K 4.1 5.1  CL 96* 98  CO2 22 19*  GLUCOSE 139* 55*  BUN 25* 26*  CREATININE 1.34* 1.56*  CALCIUM 9.0 9.1   GFR: Estimated Creatinine Clearance: 17.9 mL/min (A) (by C-G formula based on SCr of 1.56 mg/dL (H)). Recent Labs  Lab  01-16-2023 1419 01-16-23 1420 01/16/2023 1728  WBC 12.8*  --   --   LATICACIDVEN  --  6.6* >9.0*    Liver Function Tests: Recent Labs  Lab 01-16-23 1419 01-16-23 1728  AST 423* 704*  ALT 279* 456*  ALKPHOS 127* 135*  BILITOT 2.6* 3.3*  PROT 7.0 7.4  ALBUMIN 3.1* 3.3*   Recent Labs  Lab 01-16-2023 1544  LIPASE 28   No results for input(s): "AMMONIA" in the last 168 hours.  ABG    Component Value Date/Time   HCO3 24.8 2023-01-16 1419   ACIDBASEDEF 0.4 Jan 16, 2023 1419   O2SAT 42.6 Jan 16, 2023 1419     Coagulation Profile: No results for input(s): "INR", "PROTIME" in the last 168 hours.  Cardiac Enzymes: No results for input(s): "CKTOTAL", "CKMB", "CKMBINDEX", "TROPONINI" in the last 168 hours.  HbA1C: No results found for: "HGBA1C"  CBG: No results for input(s): "GLUCAP" in the last 168 hours.  Review of Systems:   Unable to obtain  Past Medical History:  She,  has a past medical history of Diastolic heart failure, Emphysema (subcutaneous) (surgical) resulting from  a procedure, Emphysema lung, and Hypertension.   Surgical History:   Past Surgical History:  Procedure Laterality Date   LEFT HEART CATH AND CORONARY ANGIOGRAPHY N/A 03/22/2020   Procedure: LEFT HEART CATH AND CORONARY ANGIOGRAPHY;  Surgeon: Lamar Blinks, MD;  Location: [facility] INVASIVE CV LAB;  Service: Cardiovascular;  Laterality: N/A;   PACEMAKER LEADLESS INSERTION N/A 11/05/2022   Procedure: PACEMAKER LEADLESS INSERTION;  Surgeon: Marcina Millard, MD;  Location: [facility] INVASIVE CV LAB;  Service: Cardiovascular;  Laterality: N/A;     Social History:   reports that she has never smoked. She has never used smokeless tobacco. She reports that she does not drink alcohol and does not use drugs.   Family History:  Her family history includes Colon cancer in her mother; Hypertension in her mother.   Allergies Allergies  Allergen Reactions   Other Rash and Swelling    "mycin" meds  Gin  Gin  alcohol. Swelling of the genital area   Penicillin G Itching   Banana Swelling   Penicillins Swelling   Sulfa Antibiotics      Home Medications  Prior to Admission medications   Medication Sig Start Date End Date Taking? Authorizing Provider  albuterol (VENTOLIN HFA) 108 (90 Base) MCG/ACT inhaler Inhale 2 puffs into the lungs every 6 (six) hours as needed for wheezing or shortness of breath. 12/15/22 01/14/23  Leeroy Bock, MD  amiodarone (PACERONE) 200 MG tablet Take 1 tablet (200 mg total) by mouth daily. 11/16/22 02/14/23  Darlin Priestly, MD  atorvastatin (LIPITOR) 80 MG tablet Take 1 tablet (80 mg total) by mouth daily. 03/23/20   Alford Highland, MD  Azelastine HCl 137 MCG/SPRAY SOLN Place 1 spray into both nostrils in the morning and at bedtime. 11/25/22   [provider]  budesonide-formoterol (SYMBICORT) 160-4.5 MCG/ACT inhaler Inhale 2 puffs into the lungs 2 (two) times daily. 12/15/22   Leeroy Bock, MD  Calcium Carb-Cholecalciferol (CALCIUM 500 + D PO) Take 1 tablet by mouth as directed. Take 1 tablet three times a week on Sunday, Wednesday and Friday mornings.    [provider]  Cholecalciferol 50 MCG (2000 UT) CAPS Take 2,000 Units by mouth daily.    [provider]  ELIQUIS 2.5 MG TABS tablet Take 1 tablet (2.5 mg total) by mouth 2 (two) times daily. 11/07/22 02/05/23  Darlin Priestly, MD  feeding supplement (ENSURE ENLIVE / ENSURE PLUS) LIQD Take 237 mLs by mouth 2 (two) times daily between meals. 11/07/22   Darlin Priestly, MD  furosemide (LASIX) 40 MG tablet Take 1 tablet (40 mg total) by mouth daily. 11/16/22   Lurene Shadow, MD  metoprolol tartrate (LOPRESSOR) 25 MG tablet Take 0.5 tablets (12.5 mg total) by mouth 2 (two) times daily. 12/15/22 03/15/23  Leeroy Bock, MD  Multiple Vitamins-Minerals (VITRUM SENIOR) TABS Take by mouth.    [provider]  sertraline (ZOLOFT) 50 MG tablet Take 50 mg by mouth daily. 01/12/20   [provider]      Critical care time: 65 minutes      Raechel Chute, MD [facility] 2023-01-03 7:24 PM

## 2023-01-13 NOTE — Progress Notes (Signed)
   2023-01-11 2000  Spiritual Encounters  Type of Visit Initial  Care provided to: Pt and family  Conversation partners present during encounter Nurse  Referral source Nurse (RN/NT/LPN);Family  Reason for visit End-of-life  OnCall Visit Yes  Spiritual Framework  Presenting Themes Significant life change  Community/Connection Family  Family Stress Factors Loss  Interventions  Spiritual Care Interventions Made Established relationship of care and support;Compassionate presence;Reflective listening;Bereavement/grief support;Prayer;Supported grief process  Intervention Outcomes  Outcomes Connection to spiritual care  Spiritual Care Plan  Spiritual Care Issues Still Outstanding No further spiritual care needs at this time (see row info)   Chaplain responded to page for EOL.  Patient in comfort care with family at bedside.  Chaplain supports patient and family with prayer as requested, words of comfort,song and compassionate presence. Chaplain partners with medical staff for family grief support.  Chaplain to sit with family and support as needed.

## 2023-01-13 DEATH — deceased
